# Patient Record
Sex: Female | Born: 1965 | Race: White | Hispanic: No | Marital: Single | Smoking: Former smoker
Health system: Southern US, Community
[De-identification: ages and names within clinical notes are randomized; demographics above are authoritative.]

## PROBLEM LIST (undated history)

## (undated) DIAGNOSIS — G35D Multiple sclerosis, unspecified: Secondary | ICD-10-CM

## (undated) DIAGNOSIS — G709 Myoneural disorder, unspecified: Secondary | ICD-10-CM

## (undated) DIAGNOSIS — H539 Unspecified visual disturbance: Secondary | ICD-10-CM

## (undated) DIAGNOSIS — G35 Multiple sclerosis: Secondary | ICD-10-CM

## (undated) DIAGNOSIS — R011 Cardiac murmur, unspecified: Secondary | ICD-10-CM

## (undated) DIAGNOSIS — M1990 Unspecified osteoarthritis, unspecified site: Secondary | ICD-10-CM

## (undated) DIAGNOSIS — Z87440 Personal history of urinary (tract) infections: Secondary | ICD-10-CM

## (undated) DIAGNOSIS — I959 Hypotension, unspecified: Secondary | ICD-10-CM

## (undated) DIAGNOSIS — I341 Nonrheumatic mitral (valve) prolapse: Secondary | ICD-10-CM

## (undated) DIAGNOSIS — K5900 Constipation, unspecified: Secondary | ICD-10-CM

## (undated) HISTORY — DX: Constipation, unspecified: K59.00

## (undated) HISTORY — PX: UPPER GASTROINTESTINAL ENDOSCOPY: SHX188

## (undated) HISTORY — PX: MENISCUS REPAIR: SHX5179

## (undated) HISTORY — DX: Hypotension, unspecified: I95.9

## (undated) HISTORY — DX: Myoneural disorder, unspecified: G70.9

## (undated) HISTORY — DX: Unspecified osteoarthritis, unspecified site: M19.90

## (undated) HISTORY — DX: Unspecified visual disturbance: H53.9

## (undated) HISTORY — DX: Cardiac murmur, unspecified: R01.1

## (undated) HISTORY — DX: Nonrheumatic mitral (valve) prolapse: I34.1

## (undated) HISTORY — DX: Multiple sclerosis, unspecified: G35.D

## (undated) HISTORY — DX: Personal history of urinary (tract) infections: Z87.440

## (undated) HISTORY — DX: Multiple sclerosis: G35

## (undated) HISTORY — PX: COLONOSCOPY: SHX174

---

## 2010-09-25 DIAGNOSIS — G35 Multiple sclerosis: Secondary | ICD-10-CM | POA: Insufficient documentation

## 2014-01-11 HISTORY — PX: OTHER SURGICAL HISTORY: SHX169

## 2014-12-30 DIAGNOSIS — R269 Unspecified abnormalities of gait and mobility: Secondary | ICD-10-CM | POA: Insufficient documentation

## 2015-06-10 DIAGNOSIS — M7061 Trochanteric bursitis, right hip: Secondary | ICD-10-CM | POA: Insufficient documentation

## 2016-06-04 ENCOUNTER — Encounter (INDEPENDENT_AMBULATORY_CARE_PROVIDER_SITE_OTHER): Payer: Self-pay

## 2016-06-04 ENCOUNTER — Ambulatory Visit (INDEPENDENT_AMBULATORY_CARE_PROVIDER_SITE_OTHER): Payer: BC Managed Care – PPO | Admitting: Neurology

## 2016-06-04 ENCOUNTER — Encounter: Payer: Self-pay | Admitting: Neurology

## 2016-06-04 VITALS — BP 108/70 | HR 68 | Resp 16 | Ht 64.0 in | Wt 118.5 lb

## 2016-06-04 DIAGNOSIS — G4700 Insomnia, unspecified: Secondary | ICD-10-CM

## 2016-06-04 DIAGNOSIS — R269 Unspecified abnormalities of gait and mobility: Secondary | ICD-10-CM

## 2016-06-04 DIAGNOSIS — R350 Frequency of micturition: Secondary | ICD-10-CM | POA: Diagnosis not present

## 2016-06-04 DIAGNOSIS — R208 Other disturbances of skin sensation: Secondary | ICD-10-CM

## 2016-06-04 DIAGNOSIS — G35 Multiple sclerosis: Secondary | ICD-10-CM

## 2016-06-04 MED ORDER — IMIPRAMINE HCL 10 MG PO TABS: ORAL_TABLET | ORAL | 5 refills | Status: DC

## 2016-06-04 NOTE — Progress Notes (Signed)
[facility]  PATIENT: Jessica Salazar DOB: [birth year]  REFERRING DOCTOR OR PCP:  Dr. Durward Parcel SOURCE: patient, notes from PCP, MRI report, MRI images on CD  _________________________________   HISTORICAL  CHIEF COMPLAINT:  Chief Complaint  Patient presents with  . Multiple Sclerosis    Jessica Salazar is here to transfer care of MS.  Sts. she was dx. about 25 yrs. ago while living in [place] (Dr. Brooks Sailors).  Also saw Dr. Cecil Cranker in [place], [place]. Presenting sx. burning, tingling right upper back, shoulder, chest.  She has only ever been on Rebif, which she took for about 2 yrs.--2003-2005.  She stopped due to side effects and didn't feel it helped. Sts. her most bothersome sx. are gait/balance.  Feels like legs are very heavy. Bladder urgency. Fatigue.  Most recently   . Numbness    followed by [facility].  Last MRI 2011, and she has cd with her/fim  . Gait Disturbance    HISTORY OF PRESENT ILLNESS:  I had the pleasure of seeing your patient, Jessica Salazar, at [facility] for neurologic consultation regarding her multiple sclerosis.  She is a 51 yo woman who presented with a painful dysesthetic sensation in the upper back and upper chest about age 89 around 77.   In retrospect, she had other milder symptoms in the preceding year or two.  She also started to have poor balance.     She had an MRI consistent with MS and did not require an LP.    She first saw a general neurologist and was started on Rebif 44 mcg (no titration).   She felt terrible and went to the ED but continued Rebif.  At one point Avonex was tried but she felt even worse than she did on the Rebif.   She then started to see Dr. Buford Dresser.   She stopped Rebif due to an extended power outage [facility]).    She had a few optic neuritis exacerbations in the mid 2000's and received IV Solu-Medrol.   She has not been on any DMT on a regular basis since 2005.    Besides ON, she had a few other exacerbation involving paresthesias and some fluctuations in her balance.    She moved to [facility] in 2011 and saw Dr. Imelda Pillow just once or twice and Dr. Pamalee Leyden once.   She never went back to [facility].      Gait/strength/sensation:   She has poor balance. Gait was worse when her knee pain was worse but she had TKR in 2016 and finally is feeling better.   Besides MS, she also has had a lot of right leg problems due to fractures.  The right leg is weaker than her left.    She has dysesthesias involving the left hand and below her waist.     Bladder:   She has urinary frequency, urgency.  There is no hesitancy but she does not completely empty.  She has nocturia x 4.  She gets UTIs. She has never been on any bladder medication.    She has constipation.  Vision:   She reports left worse than right visual acuity but colors seem more symmetric.    She denies diplopia.  Fatigue/sleep:   She reports mild fatigue.    She is able to accomplish work and chores and has only called in sick once the past few years.  She only does mildly worse in the heat.    She has  some insomnia, sleep maintenance worse than sleep onset.    Nocturia x 4 disturbs her sleep.   Mood/cognition:    She denies depression or anxiety.    She notes no major cognitive issues but has mild word finding difficulty.      I personally reviewed MRIs of the brain and spinal cord from September 2011. The MRI of the cervical spine shows that she has a few T2/FLAIR hyperintense foci located at C2, C5, C6 and C7. None of these appear to be acute. The MRI of the thoracic spine showed some probable lesions on the sagittal images. The axial images were not interpretable. The MRI of the brain showed classic T2/FLAIR hyperintense foci in the periventricular and juxtacortical white matter.  REVIEW OF SYSTEMS: Constitutional: No fevers, chills, sweats, or change in appetite.   Mild fatigue. has  Insomnia Eyes: No  visual changes, double vision, eye pain Ear, nose and throat: No hearing loss, ear pain, nasal congestion, sore throat Cardiovascular: No chest pain, palpitations Respiratory: No shortness of breath at rest or with exertion.   No wheezes GastrointestinaI: No nausea, vomiting, diarrhea, abdominal pain, fecal incontinence Genitourinary: as above Musculoskeletal: No neck pain, back pain Integumentary: No rash, pruritus, skin lesions Neurological: as above Psychiatric: No depression at this time.  No anxiety Endocrine: No palpitations, diaphoresis, change in appetite, change in weigh or increased thirst Hematologic/Lymphatic: No anemia, purpura, petechiae. Allergic/Immunologic: No itchy/runny eyes, nasal congestion, recent allergic reactions, rashes  ALLERGIES: No Known Allergies  HOME MEDICATIONS: No current outpatient prescriptions on file.  PAST MEDICAL HISTORY: Past Medical History:  Diagnosis Date  . Hypotension   . Mitral valve prolapse   . Multiple sclerosis ([place])   . Vision abnormalities     PAST SURGICAL HISTORY: Past Surgical History:  Procedure Laterality Date  . right knee replacement      FAMILY HISTORY: Family History  Problem Relation Age of Onset  . Heart attack Father     SOCIAL HISTORY:  Social History   Social History  . Marital status: Single    Spouse name: N/A  . Number of children: N/A  . Years of education: N/A   Occupational History  . Not on file.   Social History Main Topics  . Smoking status: Former Research scientist (life sciences)  . Smokeless tobacco: Never Used  . Alcohol use Yes     Comment: Rare  . Drug use: No  . Sexual activity: Not on file   Other Topics Concern  . Not on file   Social History Narrative  . No narrative on file     PHYSICAL EXAM  Vitals:   06/04/16 0832  BP: 108/70  Pulse: 68  Resp: 16  Weight: 118 lb 8 oz (53.8 kg)  Height: 5\' 4"  (1.626 m)    Body mass index is 20.34 kg/m.   General: The patient is  well-developed and well-nourished and in no acute distress  Eyes:  Funduscopic exam shows mild left optic nerve pallor.   Normal retinal vessels.  Neck: The neck is supple, no carotid bruits are noted.  The neck is nontender.  Cardiovascular: The heart has a regular rate and rhythm with a normal S1 and S2. There were no murmurs, gallops or rubs. Lungs are clear to auscultation.  Skin: Extremities are without significant edema.  Musculoskeletal:  Back is mildly tender.  Post op right TKR  Neurologic Exam  Mental status: The patient is alert and oriented x 3 at the time of the examination.  The patient has apparent normal recent and remote memory, with an apparently normal attention span and concentration ability.   Speech is normal.  Cranial nerves: Extraocular movements are full. Pupils are equal, round, and reactive to light and accomodation.  Visual fields are full.  Facial symmetry is present. There is good facial sensation to soft touch bilaterally.Facial strength is normal.  Trapezius and sternocleidomastoid strength is normal. No dysarthria is noted.  The tongue is midline, and the patient has symmetric elevation of the soft palate. No obvious hearing deficits are noted.  Motor:  Muscle bulk is normal.   Tone is normal. Strength is  5 / 5 in all 4 extremities.   Sensory: She has reduced sensation to vibration in both legs, worse on the right. She has reduced sensation to temperature and touch in the right leg and reduced temperature sensation in the right arm. There is some allodynia in the left arm.  Coordination: Cerebellar testing reveals good finger-nose-finger and reduced right heel-to-shin bilaterally.  Gait and station: Station is normal.   Gait is wide and she has a slight right foot drop. Tandem gait is very widel. Romberg is negative.   Reflexes: Deep tendon reflexes are symmetric and normal bilaterally.   Plantar responses are flexor.    DIAGNOSTIC DATA (LABS, IMAGING,  TESTING) - I reviewed patient records, labs, notes, testing and imaging myself where available.      ASSESSMENT AND PLAN  Multiple sclerosis ([place]) - Plan: MR BRAIN W WO CONTRAST, MR CERVICAL SPINE WO CONTRAST, CBC with Differential/Platelet, Hepatic function panel, Quantiferon tb gold assay (blood)  Gait abnormality - Plan: MR BRAIN W WO CONTRAST, MR CERVICAL SPINE WO CONTRAST  Dysesthesia  Urinary frequency  Insomnia, unspecified type   In summary, Shakeerah Gradel is a 51 year old woman with multiple sclerosis. She had multiple exacerbations early on in her course but feels that she has been more stable over the past decade. She was only on disease modifying therapies for a couple of years. Her last MRI was performed in 2011. I will check an MRI of the brain and cervical spine to better determine the aggressiveness of her MS. If she has gone the past 7 years without any significant change (less than a couple new lesions in the brain, no new lesions in the spinal cord), then she may be able to continue off of a disease modifying therapy. However, if she has more changes then I would highly recommend that she reinitiate therapy. We discussed that there are many more options now that were available when she was last treated. We went over some of these options and she would be most interested in [place]. I will go ahead and check some blood work in case we decide to proceed. To help with her insomnia and nocturia, I will have her start imipramine 10 mg and titrate up to 20 mg as tolerated.  She will return to see me in 3-4 months or sooner if there are new or worsening neurologic symptoms.  Thank you for asking me to see Mrs. Delaurentis. Please let me know if I can be of further assistance with her or other patients in the future.    Talani Brazee A. Felecia Shelling, MD, PhD 6/72/0947, 0:96 AM Certified in Neurology, Clinical Neurophysiology, Sleep Medicine, Pain Medicine and Neuroimaging  [facility] [address], [place], [place] [address] [phone]

## 2016-06-05 LAB — CBC WITH DIFFERENTIAL/PLATELET
BASOS: 1 %
Basophils Absolute: 0 10*3/uL (ref 0.0–0.2)
EOS (ABSOLUTE): 0.2 10*3/uL (ref 0.0–0.4)
EOS: 3 %
HEMATOCRIT: 37.1 % (ref 34.0–46.6)
HEMOGLOBIN: 11.6 g/dL (ref 11.1–15.9)
Immature Grans (Abs): 0 10*3/uL (ref 0.0–0.1)
Immature Granulocytes: 0 %
LYMPHS ABS: 1.5 10*3/uL (ref 0.7–3.1)
Lymphs: 26 %
MCH: 29.2 pg (ref 26.6–33.0)
MCHC: 31.3 g/dL — AB (ref 31.5–35.7)
MCV: 94 fL (ref 79–97)
MONOCYTES: 7 %
Monocytes Absolute: 0.4 10*3/uL (ref 0.1–0.9)
NEUTROS ABS: 3.6 10*3/uL (ref 1.4–7.0)
Neutrophils: 63 %
Platelets: 219 10*3/uL (ref 150–379)
RBC: 3.97 x10E6/uL (ref 3.77–5.28)
RDW: 14.1 % (ref 12.3–15.4)
WBC: 5.8 10*3/uL (ref 3.4–10.8)

## 2016-06-05 LAB — HEPATIC FUNCTION PANEL
ALK PHOS: 78 IU/L (ref 39–117)
ALT: 9 IU/L (ref 0–32)
AST: 14 IU/L (ref 0–40)
Albumin: 4.3 g/dL (ref 3.5–5.5)
BILIRUBIN, DIRECT: 0.19 mg/dL (ref 0.00–0.40)
Bilirubin Total: 1.1 mg/dL (ref 0.0–1.2)
Total Protein: 6.7 g/dL (ref 6.0–8.5)

## 2016-06-09 LAB — QUANTIFERON IN TUBE
QFT TB AG MINUS NIL VALUE: 0.08 IU/mL
QUANTIFERON MITOGEN VALUE: 8.3 IU/mL
QUANTIFERON TB AG VALUE: 0.21 [IU]/mL
QUANTIFERON TB GOLD: NEGATIVE
Quantiferon Nil Value: 0.13 IU/mL

## 2016-06-09 LAB — QUANTIFERON TB GOLD ASSAY (BLOOD)

## 2016-06-10 ENCOUNTER — Encounter: Payer: Self-pay | Admitting: *Deleted

## 2016-06-14 ENCOUNTER — Telehealth: Payer: Self-pay | Admitting: *Deleted

## 2016-06-14 NOTE — Telephone Encounter (Signed)
Aubagio PA completed by phone with [facility] phone# [phone].  Pt. has tried and failed Rebif./fim

## 2016-06-15 NOTE — Telephone Encounter (Signed)
Fax received from [place]. Aubagio PA approved for dates 06/14/16 thru 06/15/18.  PA# [place] [phone] MW/fim

## 2016-06-19 ENCOUNTER — Ambulatory Visit
Admission: RE | Admit: 2016-06-19 | Discharge: 2016-06-19 | Disposition: A | Discharge status: Home / Self Care | Payer: BC Managed Care – PPO | Source: Ambulatory Visit | Attending: Neurology | Admitting: Neurology

## 2016-06-19 ENCOUNTER — Ambulatory Visit
Admission: RE | Admit: 2016-06-19 | Discharge: 2016-06-19 | Disposition: A | Discharge status: Home / Self Care | Payer: Self-pay | Source: Ambulatory Visit | Attending: Neurology | Admitting: Neurology

## 2016-06-19 DIAGNOSIS — R269 Unspecified abnormalities of gait and mobility: Secondary | ICD-10-CM

## 2016-06-19 DIAGNOSIS — G35 Multiple sclerosis: Secondary | ICD-10-CM

## 2016-06-19 MED ORDER — GADOBENATE DIMEGLUMINE 529 MG/ML IV SOLN
10.0000 mL | Freq: Once | INTRAVENOUS | Status: AC | PRN
  Administered 2016-06-19: 10 mL via INTRAVENOUS

## 2016-06-21 ENCOUNTER — Telehealth: Payer: Self-pay | Admitting: *Deleted

## 2016-06-21 ENCOUNTER — Encounter: Payer: Self-pay | Admitting: Neurology

## 2016-06-21 NOTE — Telephone Encounter (Signed)
I have spoken with pt. and per RAS, advised that MRI's look about he same as they did 6 yrs. ago.  She may start Aubagio, as she does have old changes in her spinal cord, or she may wait to see if she develops new sx./what future MRI's show.  Pt. sts. that as ins. has approved Aubagio. she thinks she will go ahead and start it and let me know if she can't tolerate it.  She is aware lft's will need to be checked once a month for 5 mos.  She would like these checked in [facility].  She will let me know which [facility] location to fax orders to/fim

## 2016-08-10 ENCOUNTER — Other Ambulatory Visit: Payer: Self-pay | Admitting: *Deleted

## 2016-08-10 ENCOUNTER — Encounter: Payer: Self-pay | Admitting: Neurology

## 2016-08-10 DIAGNOSIS — Z79899 Other long term (current) drug therapy: Secondary | ICD-10-CM

## 2016-08-10 DIAGNOSIS — G35 Multiple sclerosis: Secondary | ICD-10-CM

## 2016-08-10 NOTE — Progress Notes (Signed)
Standing orders for monthly lft's faxed to [facility] fax# [phone], per pt's request/fim

## 2016-08-17 ENCOUNTER — Other Ambulatory Visit: Payer: Self-pay | Admitting: Neurology

## 2016-08-18 ENCOUNTER — Encounter: Payer: Self-pay | Admitting: Neurology

## 2016-08-18 LAB — HEPATIC FUNCTION PANEL
ALBUMIN: 4.5 g/dL (ref 3.5–5.5)
ALT: 14 IU/L (ref 0–32)
AST: 23 IU/L (ref 0–40)
Alkaline Phosphatase: 85 IU/L (ref 39–117)
BILIRUBIN TOTAL: 1.2 mg/dL (ref 0.0–1.2)
Bilirubin, Direct: 0.23 mg/dL (ref 0.00–0.40)
Total Protein: 7.2 g/dL (ref 6.0–8.5)

## 2016-08-24 ENCOUNTER — Encounter: Payer: Self-pay | Admitting: Neurology

## 2016-09-06 ENCOUNTER — Encounter: Payer: Self-pay | Admitting: Neurology

## 2016-09-07 ENCOUNTER — Encounter: Payer: Self-pay | Admitting: Neurology

## 2016-09-14 ENCOUNTER — Other Ambulatory Visit: Payer: Self-pay | Admitting: Neurology

## 2016-09-16 LAB — HEPATIC FUNCTION PANEL
ALK PHOS: 84 IU/L
ALT: 14 IU/L
AST: 20 IU/L (ref 0–40)
Albumin: 4.5 g/dL
BILIRUBIN, DIRECT: 0.18 mg/dL (ref 0.00–0.40)
Bilirubin Total: 1 mg/dL (ref 0.0–1.2)
Total Protein: 6.5 g/dL (ref 6.0–8.5)

## 2016-09-20 ENCOUNTER — Telehealth: Payer: Self-pay | Admitting: *Deleted

## 2016-09-20 NOTE — Telephone Encounter (Signed)
I have spoken with Jessica Salazar today and advised that per RAS, lab work done in our office is fine.  She verbalized understanding of same./fim

## 2016-09-20 NOTE — Telephone Encounter (Signed)
-----   Message from Britt Bottom, MD sent at 09/20/2016  1:10 PM EDT ----- Please let the patient know that the lab work is fine.

## 2016-10-01 ENCOUNTER — Ambulatory Visit: Payer: BC Managed Care – PPO | Admitting: Neurology

## 2016-10-11 ENCOUNTER — Encounter: Payer: Self-pay | Admitting: Neurology

## 2016-10-12 ENCOUNTER — Other Ambulatory Visit: Payer: Self-pay | Admitting: Neurology

## 2016-10-13 ENCOUNTER — Telehealth: Payer: Self-pay | Admitting: *Deleted

## 2016-10-13 ENCOUNTER — Encounter: Payer: Self-pay | Admitting: *Deleted

## 2016-10-13 LAB — HEPATIC FUNCTION PANEL
ALK PHOS: 79 IU/L (ref 39–117)
ALT: 15 IU/L (ref 0–32)
AST: 21 IU/L (ref 0–40)
Albumin: 4.6 g/dL (ref 3.5–5.5)
Bilirubin Total: 1.1 mg/dL (ref 0.0–1.2)
Bilirubin, Direct: 0.21 mg/dL (ref 0.00–0.40)
Total Protein: 6.6 g/dL (ref 6.0–8.5)

## 2016-10-13 NOTE — Telephone Encounter (Signed)
Lab results sent to pt. via My Chart/fim

## 2016-10-13 NOTE — Telephone Encounter (Signed)
-----   Message from Britt Bottom, MD sent at 10/13/2016  1:33 PM EDT ----- Please let the patient know that the lab work is fine.

## 2017-01-11 HISTORY — PX: COLONOSCOPY: SHX174

## 2017-07-27 ENCOUNTER — Ambulatory Visit: Payer: BC Managed Care – PPO | Admitting: Family

## 2017-07-27 ENCOUNTER — Other Ambulatory Visit (INDEPENDENT_AMBULATORY_CARE_PROVIDER_SITE_OTHER): Payer: BC Managed Care – PPO

## 2017-07-27 ENCOUNTER — Encounter: Payer: Self-pay | Admitting: Family

## 2017-07-27 VITALS — BP 98/60 | HR 55 | Temp 98.2°F | Ht 64.0 in | Wt 120.0 lb

## 2017-07-27 DIAGNOSIS — Z1231 Encounter for screening mammogram for malignant neoplasm of breast: Secondary | ICD-10-CM

## 2017-07-27 DIAGNOSIS — Z1211 Encounter for screening for malignant neoplasm of colon: Secondary | ICD-10-CM | POA: Diagnosis not present

## 2017-07-27 DIAGNOSIS — Z0000 Encounter for general adult medical examination without abnormal findings: Secondary | ICD-10-CM

## 2017-07-27 DIAGNOSIS — Z1321 Encounter for screening for nutritional disorder: Secondary | ICD-10-CM

## 2017-07-27 DIAGNOSIS — Z13220 Encounter for screening for lipoid disorders: Secondary | ICD-10-CM | POA: Diagnosis not present

## 2017-07-27 LAB — CBC WITH DIFFERENTIAL/PLATELET
BASOS PCT: 0.8 % (ref 0.0–3.0)
Basophils Absolute: 0 10*3/uL (ref 0.0–0.1)
EOS ABS: 0.2 10*3/uL (ref 0.0–0.7)
EOS PCT: 2.9 % (ref 0.0–5.0)
HCT: 38.1 % (ref 36.0–46.0)
HEMOGLOBIN: 12.7 g/dL (ref 12.0–15.0)
LYMPHS ABS: 1.4 10*3/uL (ref 0.7–4.0)
Lymphocytes Relative: 23.1 % (ref 12.0–46.0)
MCHC: 33.5 g/dL (ref 30.0–36.0)
MCV: 90.4 fl (ref 78.0–100.0)
MONO ABS: 0.5 10*3/uL (ref 0.1–1.0)
Monocytes Relative: 7.7 % (ref 3.0–12.0)
NEUTROS PCT: 65.5 % (ref 43.0–77.0)
Neutro Abs: 4.1 10*3/uL (ref 1.4–7.7)
PLATELETS: 237 10*3/uL (ref 150.0–400.0)
RBC: 4.21 Mil/uL (ref 3.87–5.11)
RDW: 13.7 % (ref 11.5–15.5)
WBC: 6.3 10*3/uL (ref 4.0–10.5)

## 2017-07-27 LAB — LIPID PANEL
CHOLESTEROL: 210 mg/dL — AB (ref 0–200)
HDL: 75.1 mg/dL (ref >39.00)
LDL Cholesterol: 118 mg/dL — ABNORMAL HIGH (ref 0–99)
NonHDL: 134.52
Total CHOL/HDL Ratio: 3
Triglycerides: 83 mg/dL (ref 0.0–149.0)
VLDL: 16.6 mg/dL (ref 0.0–40.0)

## 2017-07-27 LAB — VITAMIN D 25 HYDROXY (VIT D DEFICIENCY, FRACTURES): VITD: 33.73 ng/mL (ref 30.00–100.00)

## 2017-07-27 LAB — COMPREHENSIVE METABOLIC PANEL
ALBUMIN: 4.5 g/dL (ref 3.5–5.2)
ALT: 12 U/L (ref 0–35)
AST: 16 U/L (ref 0–37)
Alkaline Phosphatase: 60 U/L (ref 39–117)
BUN: 18 mg/dL (ref 6–23)
CHLORIDE: 105 meq/L (ref 96–112)
CO2: 30 mEq/L (ref 19–32)
CREATININE: 0.6 mg/dL (ref 0.40–1.20)
Calcium: 9.6 mg/dL (ref 8.4–10.5)
GFR: 110.66 mL/min (ref >60.00)
Glucose, Bld: 92 mg/dL (ref 70–99)
POTASSIUM: 4 meq/L (ref 3.5–5.1)
SODIUM: 142 meq/L (ref 135–145)
TOTAL PROTEIN: 7.4 g/dL (ref 6.0–8.3)
Total Bilirubin: 1.1 mg/dL (ref 0.2–1.2)

## 2017-07-27 LAB — TSH: TSH: 0.92 u[IU]/mL (ref 0.35–4.50)

## 2017-07-27 MED ORDER — CIPROFLOXACIN HCL 500 MG PO TABS
500.0000 mg | ORAL_TABLET | Freq: Two times a day (BID) | ORAL | 0 refills | Status: DC
Start: 2017-07-27 — End: 2017-09-29

## 2017-07-27 NOTE — Progress Notes (Signed)
Jessica Salazar is a 52 y.o. female with the following history as recorded in EpicCare:  Patient Active Problem List   Diagnosis Date Noted  . Dysesthesia 06/04/2016  . Urinary frequency 06/04/2016  . Insomnia 06/04/2016  . Greater trochanteric bursitis of right hip 06/10/2015  . Gait abnormality 12/30/2014  . Multiple sclerosis ([place]) 09/25/2010    Current Outpatient Medications  Medication Sig Dispense Refill  . ciprofloxacin (CIPRO) 500 MG tablet Take 1 tablet (500 mg total) by mouth 2 (two) times daily. 10 tablet 0   No current facility-administered medications for this visit.     Allergies: Patient has no known allergies.  Past Medical History:  Diagnosis Date  . Hypotension   . Mitral valve prolapse   . Multiple sclerosis ([place])   . Vision abnormalities     Past Surgical History:  Procedure Laterality Date  . right knee replacement      Family History  Problem Relation Age of Onset  . Heart attack Father     Social History   Tobacco Use  . Smoking status: Former Research scientist (life sciences)  . Smokeless tobacco: Never Used  Substance Use Topics  . Alcohol use: Yes    Comment: Rare    Subjective:  Presents to establish as a new patient today; has recently moved here from [place]; history of MS ( will be seeing specialist at [facility] to establish care in the next few weeks); will be keeping orthopedic care at [facility];  Works at [employer] as Engineer, water for [employer];  Does have occasionally UTIs related to [place]; would like to get updated Rx for Cipro to use if infection develops;   Review of Systems  Constitutional: Negative for chills, fever and weight loss.  HENT: Negative for congestion and hearing loss.   Eyes: Negative for blurred vision.  Respiratory: Negative for cough and shortness of breath.   Cardiovascular: Negative for chest pain and palpitations.  Gastrointestinal: Positive for constipation.       Chronic  Genitourinary: Negative.   Musculoskeletal:  Positive for joint pain and myalgias.  Skin: Negative.   Neurological: Negative for dizziness and headaches.  Endo/Heme/Allergies: Negative.   Psychiatric/Behavioral: Negative.      Objective:  Vitals:   07/27/17 1038  BP: 98/60  Pulse: (!) 55  Temp: 98.2 F (36.8 C)  TempSrc: Oral  SpO2: 98%  Weight: 120 lb (54.4 kg)  Height: '5\' 4"'$  (1.626 m)    General: Well developed, well nourished, in no acute distress  Skin : Warm and dry.  Head: Normocephalic and atraumatic  Eyes: Sclera and conjunctiva clear; pupils round and reactive to light; extraocular movements intact  Ears: External normal; canals clear; tympanic membranes normal  Oropharynx: Pink, supple. No suspicious lesions  Neck: Supple without thyromegaly, adenopathy  Lungs: Respirations unlabored; clear to auscultation bilaterally without wheeze, rales, rhonchi  CVS exam: normal rate and regular rhythm.  Abdomen: Soft; nontender; nondistended; normoactive bowel sounds; no masses or hepatosplenomegaly  Musculoskeletal: No deformities; no active joint inflammation; brace for right knee/ right quad atrophy  Extremities: No edema, cyanosis, clubbing  Vessels: Symmetric bilaterally  Neurologic: Alert and oriented; speech intact; face symmetrical; moves all extremities well; CNII-XII intact without focal deficit; uses cane for stability;  Assessment:  1. PE (physical exam), annual   2. Screening mammogram, encounter for   3. Encounter for screening colonoscopy   4. Need for lipid screening   5. Encounter for vitamin deficiency screening     Plan:  Age appropriate  preventive healthcare needs addressed; encouraged regular eye doctor and dental exams; encouraged regular exercise; will update labs and refills as needed today; follow-up to be determined; Information given regarding Prevnar and Pneumovax; she will check on coverage for Shingrix; Call back if DEXA needs to be updated; Refill given on Cipro as requested;   No  follow-ups on file.  Orders Placed This Encounter  Procedures  . MM Digital Screening    Standing Status:   Future    Standing Expiration Date:   09/28/2018    Order Specific Question:   Reason for Exam (SYMPTOM  OR DIAGNOSIS REQUIRED)    Answer:   screening mammogram    Order Specific Question:   Is the patient pregnant?    Answer:   No    Order Specific Question:   Preferred imaging location?    Answer:   [facility]  . CBC w/Diff    Standing Status:   Future    Number of Occurrences:   1    Standing Expiration Date:   07/27/2018  . Comp Met (CMET)    Standing Status:   Future    Number of Occurrences:   1    Standing Expiration Date:   07/27/2018  . Lipid panel    Standing Status:   Future    Number of Occurrences:   1    Standing Expiration Date:   07/28/2018  . TSH    Standing Status:   Future    Number of Occurrences:   1    Standing Expiration Date:   07/27/2018  . Vitamin D (25 hydroxy)    Standing Status:   Future    Number of Occurrences:   1    Standing Expiration Date:   07/27/2018  . Ambulatory referral to Gastroenterology    Referral Priority:   Routine    Referral Type:   Consultation    Referral Reason:   Specialty Services Required    Number of Visits Requested:   1    Requested Prescriptions   Signed Prescriptions Disp Refills  . ciprofloxacin (CIPRO) 500 MG tablet 10 tablet 0    Sig: Take 1 tablet (500 mg total) by mouth 2 (two) times daily.

## 2017-07-27 NOTE — Patient Instructions (Addendum)
Please call BCBS to see if they will cover Shingrix for you before the age of 61;   Please check with your orthopedist about status of Bone Density screening- let me know if we need to update that test for you.

## 2017-08-29 ENCOUNTER — Encounter: Payer: Self-pay | Admitting: Gastroenterology

## 2017-09-29 ENCOUNTER — Encounter: Payer: Self-pay | Admitting: Family

## 2017-09-29 ENCOUNTER — Encounter: Payer: Self-pay | Admitting: Gastroenterology

## 2017-09-29 ENCOUNTER — Ambulatory Visit (AMBULATORY_SURGERY_CENTER): Payer: Self-pay | Admitting: *Deleted

## 2017-09-29 ENCOUNTER — Ambulatory Visit: Payer: BC Managed Care – PPO | Admitting: Family

## 2017-09-29 VITALS — Ht 64.0 in | Wt 119.6 lb

## 2017-09-29 VITALS — BP 102/70 | HR 60 | Temp 98.2°F | Ht 64.0 in | Wt 119.1 lb

## 2017-09-29 DIAGNOSIS — M62838 Other muscle spasm: Secondary | ICD-10-CM

## 2017-09-29 DIAGNOSIS — M545 Low back pain, unspecified: Secondary | ICD-10-CM

## 2017-09-29 DIAGNOSIS — Z1211 Encounter for screening for malignant neoplasm of colon: Secondary | ICD-10-CM

## 2017-09-29 MED ORDER — METHOCARBAMOL 500 MG PO TABS: 500.0000 mg | ORAL_TABLET | Freq: Three times a day (TID) | ORAL | 0 refills | Status: DC | PRN

## 2017-09-29 MED ORDER — MELOXICAM 7.5 MG PO TABS: 7.5000 mg | ORAL_TABLET | Freq: Every day | ORAL | 0 refills | Status: DC

## 2017-09-29 MED ORDER — PEG 3350-KCL-NA BICARB-NACL 420 G PO SOLR: 4000.0000 mL | Freq: Once | ORAL | 0 refills | Status: AC

## 2017-09-29 NOTE — Progress Notes (Signed)
Jessica Salazar is a 52 y.o. female with the following history as recorded in EpicCare:  Patient Active Problem List   Diagnosis Date Noted  . Dysesthesia 06/04/2016  . Urinary frequency 06/04/2016  . Insomnia 06/04/2016  . Greater trochanteric bursitis of right hip 06/10/2015  . Gait abnormality 12/30/2014  . Multiple sclerosis ([place]) 09/25/2010    Current Outpatient Medications  Medication Sig Dispense Refill  . Multiple Vitamins-Minerals (MULTIVITAMIN GUMMIES ADULT PO) Take by mouth. [place] gummy multivitamin 2 po daily    . meloxicam (MOBIC) 7.5 MG tablet Take 1 tablet (7.5 mg total) by mouth daily. 20 tablet 0  . methocarbamol (ROBAXIN) 500 MG tablet Take 1 tablet (500 mg total) by mouth every 8 (eight) hours as needed. 20 tablet 0  . polyethylene glycol-electrolytes (NULYTELY/GOLYTELY) 420 g solution Take 4,000 mLs by mouth once for 1 dose. 4000 mL 0   No current facility-administered medications for this visit.     Allergies: Patient has no known allergies.  Past Medical History:  Diagnosis Date  . Arthritis   . Constipation    due to MS  . Heart murmur    MVP  . History of recurrent UTIs   . Hypotension   . Mitral valve prolapse   . Multiple sclerosis ([place])   . Neuromuscular disorder ([place])    has /NMS stimulator   . Vision abnormalities     Past Surgical History:  Procedure Laterality Date  . COLONOSCOPY     last > 10 yrs ago in [place] - done for constipation  . MENISCUS REPAIR Right   . right knee replacement Right 2016   right TOtal knee relacement   . UPPER GASTROINTESTINAL ENDOSCOPY      Family History  Problem Relation Age of Onset  . Heart attack Father   . Colon polyps Neg Hx   . Colon cancer Neg Hx   . Esophageal cancer Neg Hx   . Rectal cancer Neg Hx   . Stomach cancer Neg Hx     Social History   Tobacco Use  . Smoking status: Former Research scientist (life sciences)  . Smokeless tobacco: Never Used  Substance Use Topics  . Alcohol use: Yes    Comment: Rare     Subjective:  Patient presents with low back pain x 2 days; feels like she strained her low back getting off her mattress on Tuesday; no numbness or tingling radiating into lower legs; using TENS simulator with some benefit; has taken 600 mg Ibuprofen with mild relief;     Objective:  Vitals:   09/29/17 1412  BP: 102/70  Pulse: 60  Temp: 98.2 F (36.8 C)  TempSrc: Oral  SpO2: 99%  Weight: 119 lb 1.3 oz (54 kg)  Height: 5\' 4"  (1.626 m)    General: Well developed, well nourished, in no acute distress  Skin : Warm and dry.  Head: Normocephalic and atraumatic  Lungs: Respirations unlabored; clear to auscultation bilaterally without wheeze, rales, rhonchi  Musculoskeletal: No deformities; no active joint inflammation  Extremities: No edema, cyanosis, clubbing  Vessels: Symmetric bilaterally  Neurologic: Alert and oriented; speech intact; face symmetrical; moves all extremities well; CNII-XII intact without focal deficit   Assessment:  1. Low back pain without sciatica, unspecified back pain laterality, unspecified chronicity   2. Muscle spasm     Plan:  Rx for Mobic 7.5 mg qd, Robaxin 500 mg qhs; apply heat and follow-up worse, no better.   No follow-ups on file.  No orders of the  defined types were placed in this encounter.   Requested Prescriptions   Signed Prescriptions Disp Refills  . meloxicam (MOBIC) 7.5 MG tablet 20 tablet 0    Sig: Take 1 tablet (7.5 mg total) by mouth daily.  . methocarbamol (ROBAXIN) 500 MG tablet 20 tablet 0    Sig: Take 1 tablet (500 mg total) by mouth every 8 (eight) hours as needed.

## 2017-09-29 NOTE — Progress Notes (Signed)
No egg or soy allergy known to patient  No issues with past sedation with any surgeries  or procedures, no intubation problems  No diet pills per patient No home 02 use per patient  No blood thinners per patient  Pt STATES  issues with constipation  Due to MS_ does not take meds to help this  No A fib or A flutter  EMMI video sent to pt's e mail = pt declined

## 2017-10-10 ENCOUNTER — Ambulatory Visit
Admission: RE | Admit: 2017-10-10 | Discharge: 2017-10-10 | Disposition: A | Discharge status: Home / Self Care | Payer: BC Managed Care – PPO | Source: Ambulatory Visit | Attending: Family | Admitting: Family

## 2017-10-10 DIAGNOSIS — Z1231 Encounter for screening mammogram for malignant neoplasm of breast: Secondary | ICD-10-CM

## 2017-10-10 IMAGING — MG DIGITAL SCREENING BILATERAL MAMMOGRAM WITH TOMO AND CAD
8 series · 9 of 24 positions shown · non-contrast
Comparison: None.

CLINICAL DATA: Screening.

EXAM:
DIGITAL SCREENING BILATERAL MAMMOGRAM WITH TOMO AND CAD

[L CC synth-2D]
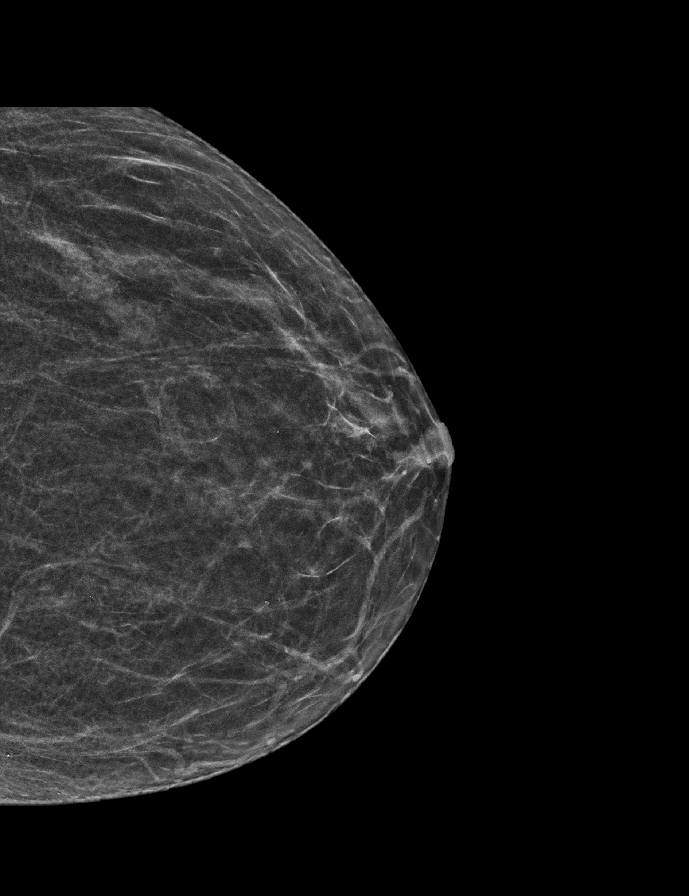

[R CC synth-2D]
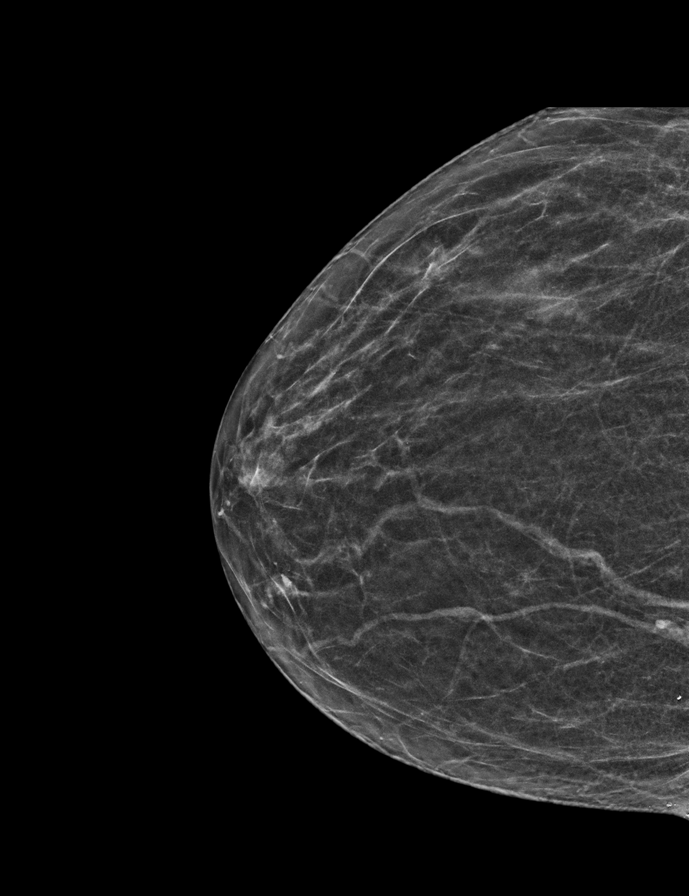

[R MLO synth-2D]
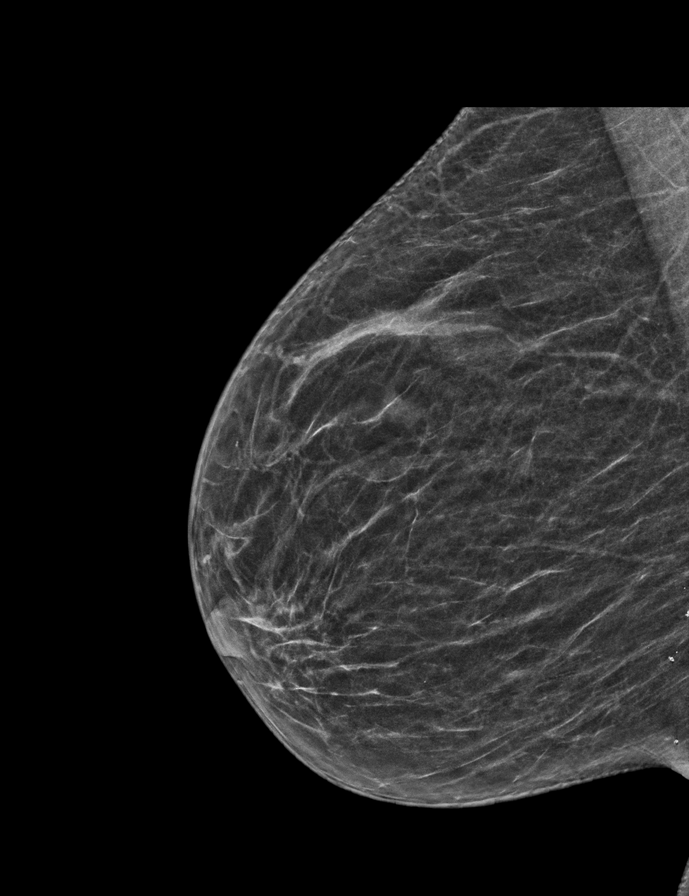

[L MLO synth-2D]
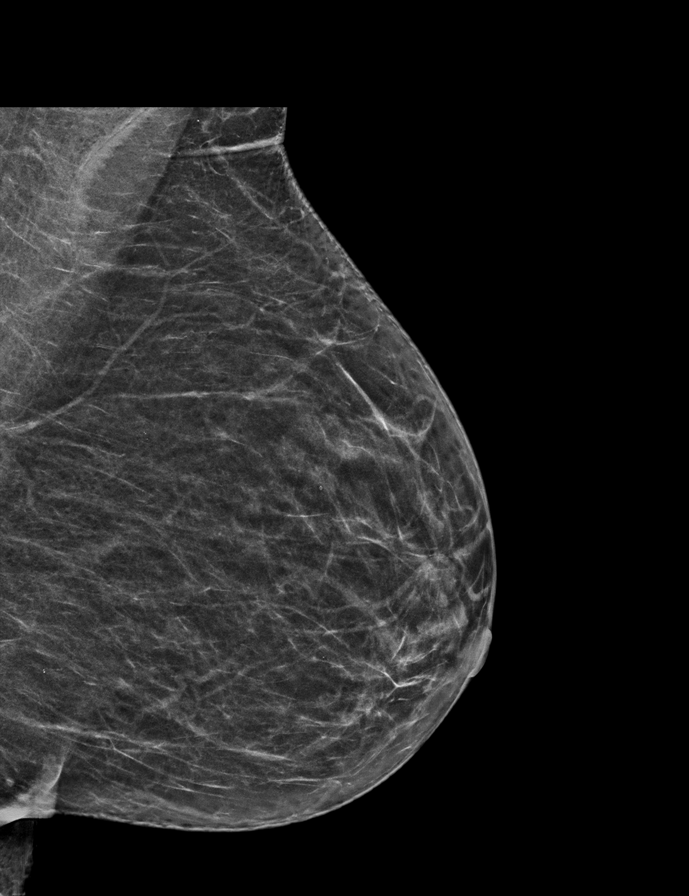

[R MLO tomo · 2 of 52 frames shown]
[frame 17/52]
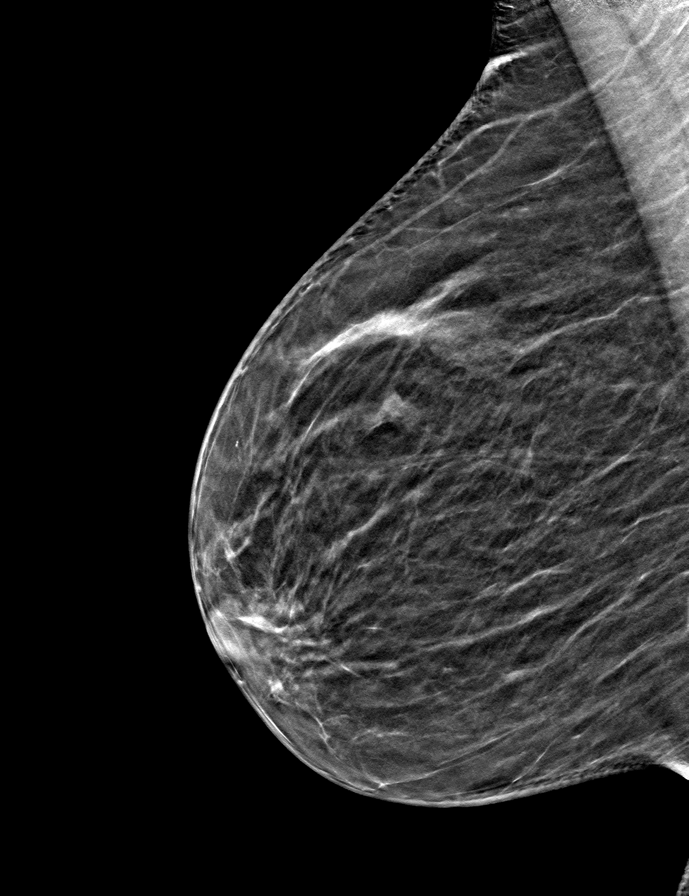
[frame 27/52]
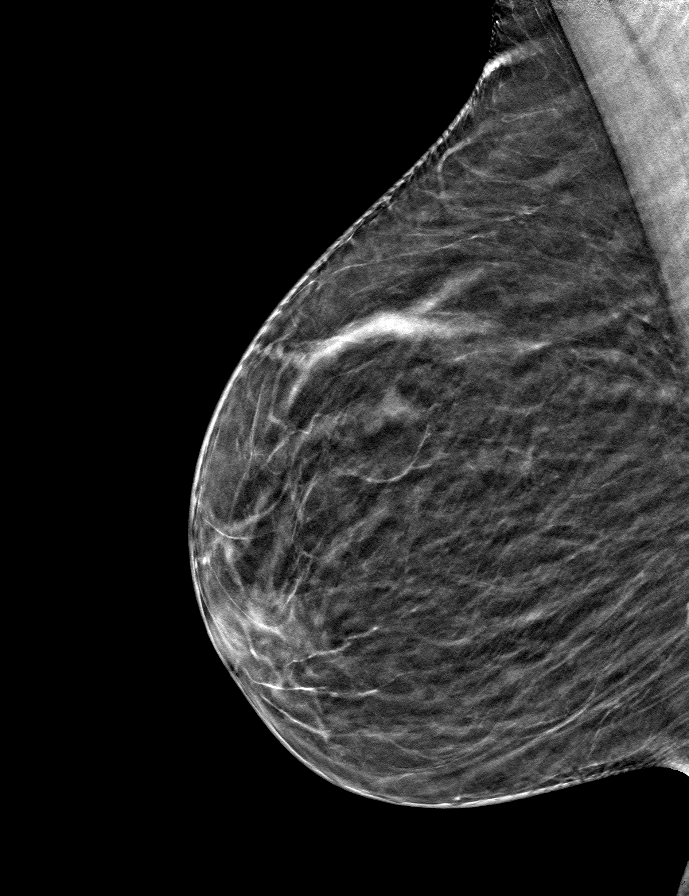

[L CC tomo · tomo slice 23/46.0]
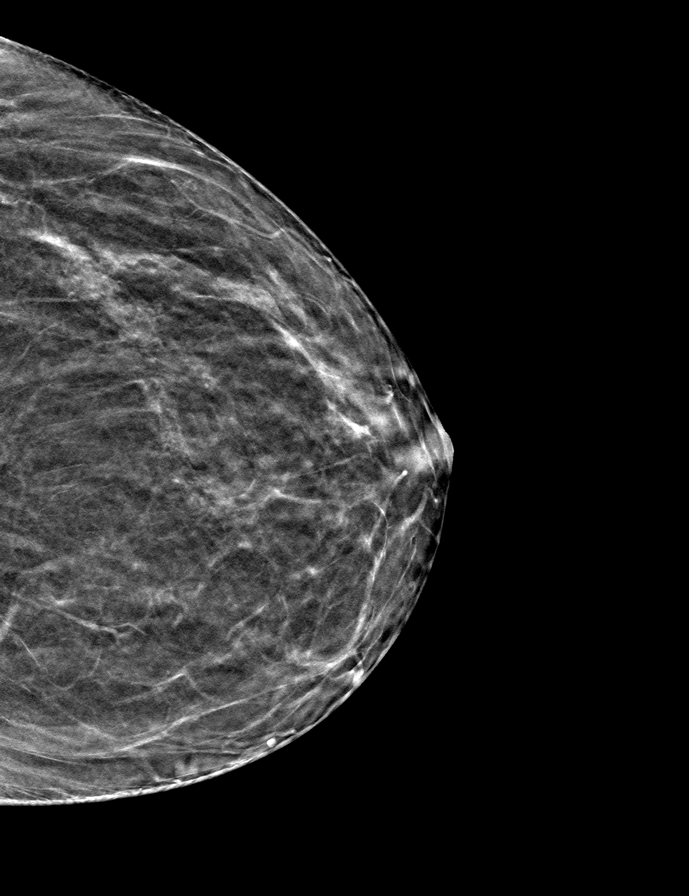

[L MLO tomo · tomo slice 28/55.0]
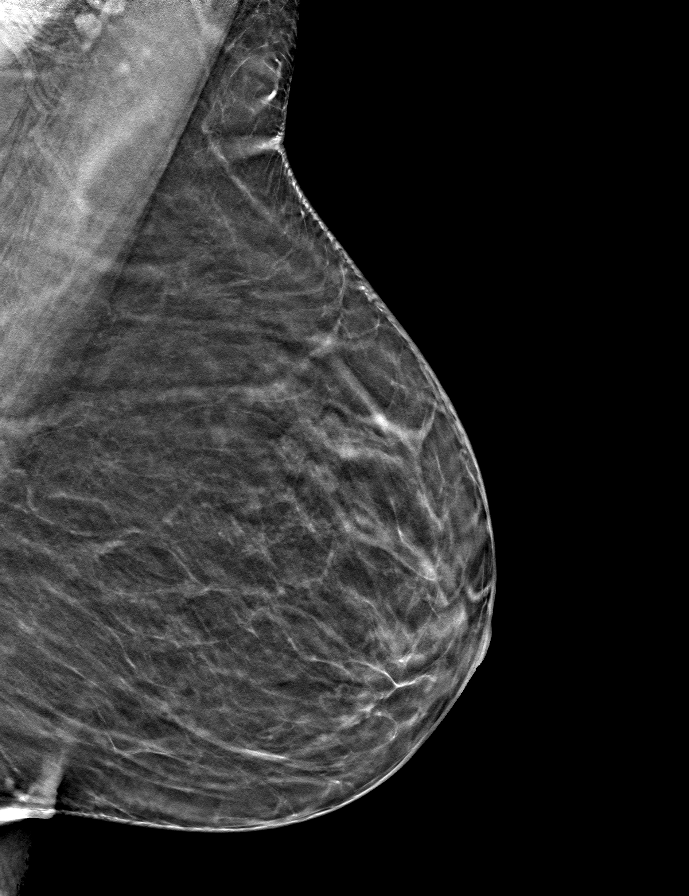

[R CC tomo · tomo slice 25/49.0]
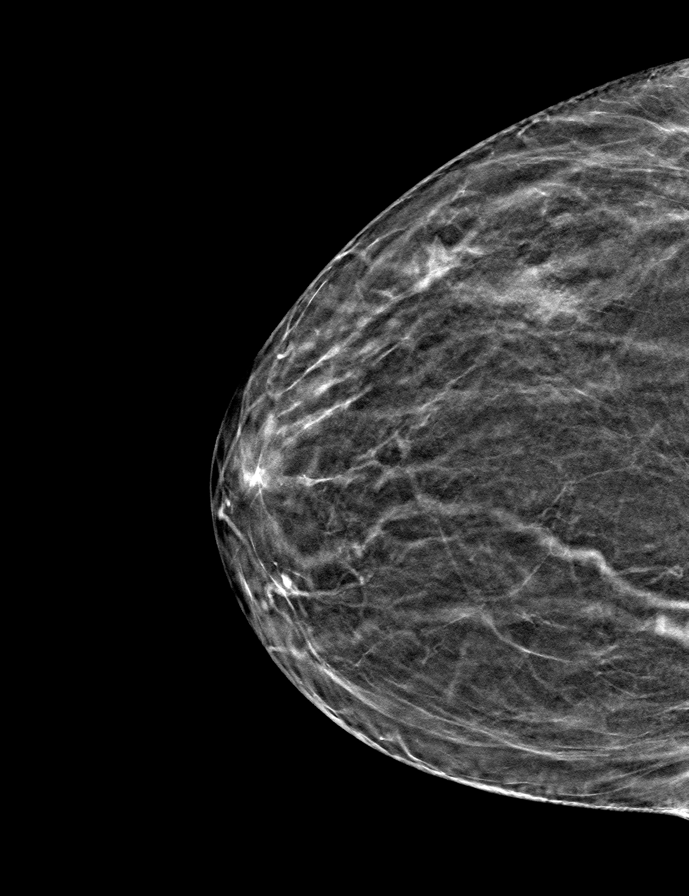

[9 of 24 positions shown; findings below may reference images not displayed]

ACR Breast Density Category b: There are scattered areas of
fibroglandular density.
FINDINGS: There are no findings suspicious for malignancy. Images were
processed with CAD.
IMPRESSION: No mammographic evidence of malignancy. A result letter of this
screening mammogram will be mailed directly to the patient.

RECOMMENDATION:
Screening mammogram in one year. (Code:[1T])

BI-RADS CATEGORY  1: Negative.

## 2017-10-12 ENCOUNTER — Encounter: Payer: Self-pay | Admitting: Gastroenterology

## 2017-10-12 ENCOUNTER — Ambulatory Visit (AMBULATORY_SURGERY_CENTER): Payer: BC Managed Care – PPO | Admitting: Gastroenterology

## 2017-10-12 VITALS — BP 116/74 | HR 64 | Temp 97.1°F | Resp 16 | Ht 64.0 in | Wt 119.0 lb

## 2017-10-12 DIAGNOSIS — D122 Benign neoplasm of ascending colon: Secondary | ICD-10-CM | POA: Diagnosis not present

## 2017-10-12 DIAGNOSIS — Z1211 Encounter for screening for malignant neoplasm of colon: Secondary | ICD-10-CM | POA: Diagnosis not present

## 2017-10-12 MED ORDER — SODIUM CHLORIDE 0.9 % IV SOLN: 500.0000 mL | Freq: Once | INTRAVENOUS | Status: DC

## 2017-10-12 NOTE — Op Note (Signed)
[facility] Patient Name: Jessica Salazar Procedure Date: 10/12/2017 7:31 AM MRN: [id number] Endoscopist: Justice Britain , MD Age: 52 Referring MD:  Date of Birth: 03/10/1965 Gender: Female Account #: [id number] Procedure:                Colonoscopy Indications:              Screening for colorectal malignant neoplasm Medicines:                Monitored Anesthesia Care Procedure:                Pre-Anesthesia Assessment:                           - Prior to the procedure, a History and Physical                            was performed, and patient medications and                            allergies were reviewed. The patient's tolerance of                            previous anesthesia was also reviewed. The risks                            and benefits of the procedure and the sedation                            options and risks were discussed with the patient.                            All questions were answered, and informed consent                            was obtained. Prior Anticoagulants: The patient has                            taken previous NSAID medication. ASA Grade                            Assessment: I - A normal, healthy patient. After                            reviewing the risks and benefits, the patient was                            deemed in satisfactory condition to undergo the                            procedure.                           After obtaining informed consent, the colonoscope  was passed under direct vision. Throughout the                            procedure, the patient's blood pressure, pulse, and                            oxygen saturations were monitored continuously. The                            Colonoscope was introduced through the anus and                            advanced to the the cecum, identified by                            appendiceal orifice and ileocecal valve. The                patient tolerated the procedure well. The quality                            of the bowel preparation was evaluated using the                            BBPS [facility] Bowel Preparation Scale) with scores                            of: Right Colon = 2 (minor amount of residual                            staining, small fragments of stool and/or opaque                            liquid, but mucosa seen well), Transverse Colon = 3                            (entire mucosa seen well with no residual staining,                            small fragments of stool or opaque liquid) and Left                            Colon = 3 (entire mucosa seen well with no residual                            staining, small fragments of stool or opaque                            liquid). The total BBPS score equals 8. The quality                            of the bowel preparation was good. The colonoscopy  was somewhat difficult due to a tortuous colon.                            Successful completion of the procedure was aided by                            changing the patient's position, using manual                            pressure, straightening and shortening the scope to                            obtain bowel loop reduction and using scope torsion. Scope In: 3:61:44 AM Scope Out: 7:55:12 AM Scope Withdrawal Time: 0 hours 9 minutes 45 seconds  Total Procedure Duration: 0 hours 17 minutes 1 second  Findings:                 The digital rectal exam findings include                            non-thrombosed external hemorrhoids. Pertinent                            negatives include no palpable rectal lesions.                           A 2 mm polyp was found in the ascending colon. The                            polyp was sessile. The polyp was removed with a                            cold snare. Resection and retrieval were complete.                            Multiple small-mouthed diverticula were found in                            the recto-sigmoid colon, sigmoid colon, descending                            colon, transverse colon and ascending colon.                           Normal mucosa was found in the entire colon.                           Non-bleeding non-thrombosed external and internal                            hemorrhoids were found during retroflexion and                            during perianal exam. The hemorrhoids were Grade I                            (  internal hemorrhoids that do not prolapse). Complications:            No immediate complications. Estimated Blood Loss:     Estimated blood loss was minimal. Impression:               - Non-thrombosed external hemorrhoids found on                            digital rectal exam.                           - One 2 mm polyp in the ascending colon, removed                            with a cold snare. Resected and retrieved.                           - Diverticulosis in the recto-sigmoid colon, in the                            sigmoid colon, in the descending colon, in the                            transverse colon and in the ascending colon.                           - Normal mucosa in the entire examined colon.                           - Non-bleeding non-thrombosed external and internal                            hemorrhoids. Recommendation:           - The patient will be observed post-procedure,                            until all discharge criteria are met.                           - Discharge patient to home.                           - Patient has a contact number available for                            emergencies. The signs and symptoms of potential                            delayed complications were discussed with the                            patient. Return to normal activities tomorrow.                            Written discharge instructions were provided  to the                            patient.                           - Resume previous diet.                           - Continue present medications.                           - Await pathology results.                           - Repeat colonoscopy in 5-10 years for surveillance                            based on pathology results.                           - High fiber diet. Consider using fiber, for                            example Citrucel, Fibercon, Konsyl or Metamucil to                            decrease risk of hemorrhoidal issues.                           - The findings and recommendations were discussed                            with the patient.                           - The findings and recommendations were discussed                            with the designated responsible adult. Justice Britain, MD 10/12/2017 8:01:04 AM

## 2017-10-12 NOTE — Patient Instructions (Signed)
Please read handouts provided. Continue present medications. Consider using Fiber.      YOU HAD AN ENDOSCOPIC PROCEDURE TODAY AT [facility]:   Refer to the procedure report that was given to you for any specific questions about what was found during the examination.  If the procedure report does not answer your questions, please call your gastroenterologist to clarify.  If you requested that your care partner not be given the details of your procedure findings, then the procedure report has been included in a sealed envelope for you to review at your convenience later.  YOU SHOULD EXPECT: Some feelings of bloating in the abdomen. Passage of more gas than usual.  Walking can help get rid of the air that was put into your GI tract during the procedure and reduce the bloating. If you had a lower endoscopy (such as a colonoscopy or flexible sigmoidoscopy) you may notice spotting of blood in your stool or on the toilet paper. If you underwent a bowel prep for your procedure, you may not have a normal bowel movement for a few days.  Please Note:  You might notice some irritation and congestion in your nose or some drainage.  This is from the oxygen used during your procedure.  There is no need for concern and it should clear up in a day or so.  SYMPTOMS TO REPORT IMMEDIATELY:   Following lower endoscopy (colonoscopy or flexible sigmoidoscopy):  Excessive amounts of blood in the stool  Significant tenderness or worsening of abdominal pains  Swelling of the abdomen that is new, acute  Fever of 100F or higher   For urgent or emergent issues, a gastroenterologist can be reached at any hour by calling [phone].   DIET:  We do recommend a small meal at first, but then you may proceed to your regular diet.  Drink plenty of fluids but you should avoid alcoholic beverages for 24 hours.  ACTIVITY:  You should plan to take it easy for the rest of today and you should NOT DRIVE or  use heavy machinery until tomorrow (because of the sedation medicines used during the test).    FOLLOW UP: Our staff will call the number listed on your records the next business day following your procedure to check on you and address any questions or concerns that you may have regarding the information given to you following your procedure. If we do not reach you, we will leave a message.  However, if you are feeling well and you are not experiencing any problems, there is no need to return our call.  We will assume that you have returned to your regular daily activities without incident.  If any biopsies were taken you will be contacted by phone or by letter within the next 1-3 weeks.  Please call us at [phone] if you have not heard about the biopsies in 3 weeks.    SIGNATURES/CONFIDENTIALITY: You and/or your care partner have signed paperwork which will be entered into your electronic medical record.  These signatures attest to the fact that that the information above on your After Visit Summary has been reviewed and is understood.  Full responsibility of the confidentiality of this discharge information lies with you and/or your care-partner.

## 2017-10-12 NOTE — Progress Notes (Signed)
Called to room to assist during endoscopic procedure.  Patient ID and intended procedure confirmed with present staff. Received instructions for my participation in the procedure from the performing physician.  

## 2017-10-12 NOTE — Progress Notes (Signed)
Pt's states no medical or surgical changes since previsit or office visit. 

## 2017-10-12 NOTE — Progress Notes (Signed)
Report to PACU, RN, vss, BBS= Clear.  

## 2017-10-13 ENCOUNTER — Telehealth: Payer: Self-pay | Admitting: *Deleted

## 2017-10-13 NOTE — Telephone Encounter (Signed)
  Follow up Call-  Call back number 10/12/2017  Post procedure Call Back phone  # [phone]  Permission to leave phone message Yes  Some recent data might be hidden     Patient questions:  Do you have a fever, pain , or abdominal swelling? No. Pain Score  0 *  Have you tolerated food without any problems? Yes.    Have you been able to return to your normal activities? Yes.    Do you have any questions about your discharge instructions: Diet   No. Medications  No. Follow up visit  No.  Do you have questions or concerns about your Care? No.  Actions: * If pain score is 4 or above: No action needed, pain <4.

## 2017-10-20 ENCOUNTER — Encounter: Payer: Self-pay | Admitting: Gastroenterology

## 2018-02-17 ENCOUNTER — Encounter: Payer: Self-pay | Admitting: Family

## 2018-02-17 ENCOUNTER — Other Ambulatory Visit: Payer: Self-pay | Admitting: Family

## 2018-02-17 MED ORDER — CIPROFLOXACIN HCL 500 MG PO TABS: 500.0000 mg | ORAL_TABLET | Freq: Two times a day (BID) | ORAL | 0 refills | Status: DC

## 2018-03-06 ENCOUNTER — Encounter: Payer: Self-pay | Admitting: Family

## 2018-03-06 ENCOUNTER — Other Ambulatory Visit (INDEPENDENT_AMBULATORY_CARE_PROVIDER_SITE_OTHER): Payer: BC Managed Care – PPO

## 2018-03-06 ENCOUNTER — Ambulatory Visit: Payer: BC Managed Care – PPO | Admitting: Family

## 2018-03-06 ENCOUNTER — Other Ambulatory Visit: Payer: Self-pay

## 2018-03-06 VITALS — BP 112/76 | HR 59 | Temp 97.4°F | Ht 64.0 in | Wt 122.0 lb

## 2018-03-06 DIAGNOSIS — R5383 Other fatigue: Secondary | ICD-10-CM

## 2018-03-06 LAB — CBC WITH DIFFERENTIAL/PLATELET
Basophils Absolute: 0 10*3/uL (ref 0.0–0.1)
Basophils Relative: 0.8 % (ref 0.0–3.0)
Eosinophils Absolute: 0.2 10*3/uL (ref 0.0–0.7)
Eosinophils Relative: 3 % (ref 0.0–5.0)
HCT: 38.1 % (ref 36.0–46.0)
HEMOGLOBIN: 12.8 g/dL (ref 12.0–15.0)
Lymphocytes Relative: 26.7 % (ref 12.0–46.0)
Lymphs Abs: 1.5 10*3/uL (ref 0.7–4.0)
MCHC: 33.7 g/dL (ref 30.0–36.0)
MCV: 89.6 fl (ref 78.0–100.0)
Monocytes Absolute: 0.6 10*3/uL (ref 0.1–1.0)
Monocytes Relative: 10.1 % (ref 3.0–12.0)
Neutro Abs: 3.4 10*3/uL (ref 1.4–7.7)
Neutrophils Relative %: 59.4 % (ref 43.0–77.0)
Platelets: 222 10*3/uL (ref 150.0–400.0)
RBC: 4.26 Mil/uL (ref 3.87–5.11)
RDW: 13.3 % (ref 11.5–15.5)
WBC: 5.7 10*3/uL (ref 4.0–10.5)

## 2018-03-06 LAB — COMPREHENSIVE METABOLIC PANEL
ALK PHOS: 61 U/L (ref 39–117)
ALT: 12 U/L (ref 0–35)
AST: 17 U/L (ref 0–37)
Albumin: 4.5 g/dL (ref 3.5–5.2)
BUN: 12 mg/dL (ref 6–23)
CALCIUM: 9.6 mg/dL (ref 8.4–10.5)
CO2: 28 mEq/L (ref 19–32)
Chloride: 106 mEq/L (ref 96–112)
Creatinine, Ser: 0.51 mg/dL (ref 0.40–1.20)
GFR: 125.31 mL/min (ref >60.00)
Glucose, Bld: 79 mg/dL (ref 70–99)
Potassium: 4.2 mEq/L (ref 3.5–5.1)
Sodium: 141 mEq/L (ref 135–145)
Total Bilirubin: 1.1 mg/dL (ref 0.2–1.2)
Total Protein: 7.2 g/dL (ref 6.0–8.3)

## 2018-03-06 LAB — TSH: TSH: 1.14 u[IU]/mL (ref 0.35–4.50)

## 2018-03-06 LAB — VITAMIN B12: Vitamin B-12: 397 pg/mL (ref 211–911)

## 2018-03-06 LAB — VITAMIN D 25 HYDROXY (VIT D DEFICIENCY, FRACTURES): VITD: 38.11 ng/mL (ref 30.00–100.00)

## 2018-03-06 NOTE — Progress Notes (Signed)
Jessica Salazar is a 53 y.o. female with the following history as recorded in EpicCare:  Patient Active Problem List   Diagnosis Date Noted  . Dysesthesia 06/04/2016  . Urinary frequency 06/04/2016  . Insomnia 06/04/2016  . Greater trochanteric bursitis of right hip 06/10/2015  . Gait abnormality 12/30/2014  . Multiple sclerosis ([place]) 09/25/2010    Current Outpatient Medications  Medication Sig Dispense Refill  . Multiple Vitamins-Minerals (MULTIVITAMIN GUMMIES ADULT PO) Take by mouth. Olly gummy multivitamin 2 po daily     No current facility-administered medications for this visit.     Allergies: Patient has no known allergies.  Past Medical History:  Diagnosis Date  . Arthritis   . Constipation    due to MS  . Heart murmur    MVP  . History of recurrent UTIs   . Hypotension   . Mitral valve prolapse   . Multiple sclerosis ([place])   . Neuromuscular disorder ([place])    has /NMS stimulator   . Vision abnormalities     Past Surgical History:  Procedure Laterality Date  . COLONOSCOPY     last > 10 yrs ago in [place] - done for constipation  . MENISCUS REPAIR Right   . right knee replacement Right 2016   right TOtal knee relacement   . UPPER GASTROINTESTINAL ENDOSCOPY      Family History  Problem Relation Age of Onset  . Heart attack Father   . Colon polyps Neg Hx   . Colon cancer Neg Hx   . Esophageal cancer Neg Hx   . Rectal cancer Neg Hx   . Stomach cancer Neg Hx     Social History   Tobacco Use  . Smoking status: Former Research scientist (life sciences)  . Smokeless tobacco: Never Used  Substance Use Topics  . Alcohol use: Yes    Comment: Rare    Subjective:  Patient notes she has been having increased problems with her MS recently; has been feeling more tired recently- having difficulty acclimating to weather changes; worried that she might be dehydrated; requesting to get her labs re-checked; notes is not currently under care of neurology- planning to transfer care to provider at  [facility];  Has been trying to exercise more regularly recently- just feeling like she needs some energy; Is sleeping well; Admits she is not drinking enough water; Does not want to update urine culture- has no concerns that she has an infection.     Objective:  Vitals:   03/06/18 0858  BP: 112/76  Pulse: (!) 59  Temp: (!) 97.4 F (36.3 C)  TempSrc: Oral  SpO2: 96%  Weight: 122 lb (55.3 kg)  Height: '5\' 4"'$  (1.626 m)    General: Well developed, well nourished, in no acute distress  Skin : Warm and dry.  Head: Normocephalic and atraumatic  Lungs: Respirations unlabored; clear to auscultation bilaterally without wheeze, rales, rhonchi  CVS exam: normal rate and regular rhythm.  Neurologic: Alert and oriented; speech intact; face symmetrical; moves all extremities well; CNII-XII intact without focal deficit   Assessment:  1. Other fatigue     Plan:  Concern for MS flare- patient does not have current neurologist; she is suspicious that needs B12 injection; will update labs today and follow-up to be determined; encouraged to schedule with neurologist at [facility] where she wants to establish care;   No follow-ups on file.  Orders Placed This Encounter  Procedures  . CBC w/Diff    Standing Status:   Future  Standing Expiration Date:   03/06/2019  . Comp Met (CMET)    Standing Status:   Future    Standing Expiration Date:   03/06/2019  . TSH    Standing Status:   Future    Standing Expiration Date:   03/06/2019  . Vitamin D (25 hydroxy)    Standing Status:   Future    Standing Expiration Date:   03/06/2019  . B12    Standing Status:   Future    Standing Expiration Date:   03/06/2019    Requested Prescriptions    No prescriptions requested or ordered in this encounter

## 2018-08-17 ENCOUNTER — Encounter: Payer: Self-pay | Admitting: Family

## 2018-08-22 ENCOUNTER — Ambulatory Visit (INDEPENDENT_AMBULATORY_CARE_PROVIDER_SITE_OTHER): Payer: BC Managed Care – PPO | Admitting: Family

## 2018-08-22 ENCOUNTER — Encounter: Payer: Self-pay | Admitting: Family

## 2018-08-22 DIAGNOSIS — G35 Multiple sclerosis: Secondary | ICD-10-CM | POA: Diagnosis not present

## 2018-08-22 MED ORDER — PREDNISONE 10 MG (21) PO TBPK: ORAL_TABLET | ORAL | 0 refills | Status: DC

## 2018-08-22 NOTE — Progress Notes (Signed)
Jessica Salazar is a 53 y.o. female with the following history as recorded in EpicCare:  Patient Active Problem List   Diagnosis Date Noted  . Dysesthesia 06/04/2016  . Urinary frequency 06/04/2016  . Insomnia 06/04/2016  . Greater trochanteric bursitis of right hip 06/10/2015  . Gait abnormality 12/30/2014  . Multiple sclerosis ([place]) 09/25/2010    Current Outpatient Medications  Medication Sig Dispense Refill  . Multiple Vitamins-Minerals (MULTIVITAMIN GUMMIES ADULT PO) Take by mouth. Olly gummy multivitamin 2 po daily    . predniSONE (STERAPRED UNI-PAK 21 TAB) 10 MG (21) TBPK tablet Taper as directed 21 tablet 0   No current facility-administered medications for this visit.     Allergies: Patient has no known allergies.  Past Medical History:  Diagnosis Date  . Arthritis   . Constipation    due to MS  . Heart murmur    MVP  . History of recurrent UTIs   . Hypotension   . Mitral valve prolapse   . Multiple sclerosis ([place])   . Neuromuscular disorder ([place])    has /NMS stimulator   . Vision abnormalities     Past Surgical History:  Procedure Laterality Date  . COLONOSCOPY     last > 10 yrs ago in [place] - done for constipation  . MENISCUS REPAIR Right   . right knee replacement Right 2016   right TOtal knee relacement   . UPPER GASTROINTESTINAL ENDOSCOPY      Family History  Problem Relation Age of Onset  . Heart attack Father   . Colon polyps Neg Hx   . Colon cancer Neg Hx   . Esophageal cancer Neg Hx   . Rectal cancer Neg Hx   . Stomach cancer Neg Hx     Social History   Tobacco Use  . Smoking status: Former Research scientist (life sciences)  . Smokeless tobacco: Never Used  Substance Use Topics  . Alcohol use: Yes    Comment: Rare    Subjective:    I connected with Jessica Salazar on 11/07/23 at  8:40 AM EDT by a video enabled telemedicine application and verified that I am speaking with the correct person using two identifiers. Patient and I are the only 2 people on the video  call.    I discussed the limitations of evaluation and management by telemedicine and the availability of in person appointments. The patient expressed understanding and agreed to proceed.  Feels that has been having MS flare for the past 2-3 weeks; increased sense of right sided weakness; no urinary symptoms, no fever; requesting treatment with prednisone pack; last MRI was 2 years ago; Is planning to establish with [facility] but will not be able to see her until October 2020;     Objective:  There were no vitals filed for this visit.  General: Well developed, well nourished, in no acute distress  Head: Normocephalic and atraumatic  Lungs: Respirations unlabored;  Neurologic: Alert and oriented; speech intact; face symmetrical;   Assessment:  1. Multiple sclerosis ([place])     Plan:  Rx for Prednisone taper pack to treat acute flare; keep planned follow-up with neurology at [facility] for later this fall; follow-up worse, no better in the interim.   No follow-ups on file.  No orders of the defined types were placed in this encounter.   Requested Prescriptions   Signed Prescriptions Disp Refills  . predniSONE (STERAPRED UNI-PAK 21 TAB) 10 MG (21) TBPK tablet 21 tablet 0    Sig: Taper  as directed

## 2018-08-25 ENCOUNTER — Encounter: Payer: Self-pay | Admitting: Family

## 2018-08-31 ENCOUNTER — Encounter: Payer: Self-pay | Admitting: Family

## 2018-08-31 ENCOUNTER — Other Ambulatory Visit: Payer: Self-pay | Admitting: Family

## 2018-08-31 MED ORDER — CIPROFLOXACIN HCL 500 MG PO TABS: 500.0000 mg | ORAL_TABLET | Freq: Two times a day (BID) | ORAL | 0 refills | Status: DC

## 2018-08-31 MED ORDER — PREDNISONE 10 MG (21) PO TBPK: ORAL_TABLET | ORAL | 0 refills | Status: DC

## 2018-09-07 ENCOUNTER — Encounter: Payer: Self-pay | Admitting: Family

## 2018-10-23 ENCOUNTER — Encounter: Payer: Self-pay | Admitting: Family

## 2018-10-23 ENCOUNTER — Encounter: Payer: Self-pay | Admitting: Neurology

## 2018-10-23 ENCOUNTER — Other Ambulatory Visit: Payer: Self-pay | Admitting: Family

## 2018-10-23 DIAGNOSIS — G35 Multiple sclerosis: Secondary | ICD-10-CM

## 2018-10-31 ENCOUNTER — Other Ambulatory Visit: Payer: Self-pay | Admitting: Family

## 2018-10-31 DIAGNOSIS — Z1231 Encounter for screening mammogram for malignant neoplasm of breast: Secondary | ICD-10-CM

## 2018-11-19 NOTE — Progress Notes (Signed)
NEUROLOGY CONSULTATION NOTE  [name] MRN: [id number] DOB: 1965/11/23  Referring provider: Marrian Salvage, [place] Primary care provider: Marrian Salvage, FNP  Reason for consult:  Multiple sclerosis  HISTORY OF PRESENT ILLNESS: Jessica Salazar is a 53 year old right-handed Caucasian woman who presents to establish care for multiple sclerosis.  History supplemented by prior neurologist's and referring provider's notes.  She was diagnosed with multiple sclerosis in 1993 after presenting with painful burning and tingling sensation in the upper back and chest as well as difficulty with balance.  Diagnosis was concerned via MRI.  She did not undergo lumbar puncture.  She had recurrent episodes of optic neuritis in the mid 2000s requiring treatment with Solu-Medrol.  Other exacerbations include paresthesias and increased difficulty with balance and typically right sided weakness.  She last saw a neurologist in 2018, Dr. Arlice Colt.  At that time, she was started on Aubagio but caused hair loss and didn't think she was getting relief so she stopped.  She initially was started on Avonex in late-90s or early 2000s.  She started Rebiff around 2003 but had decreased appetite and stopped after 2 years.  She was on Aubagio in 2018 for 2 months.  She does not like the idea of taking DMT.  2 months ago, she had a flare up of right sided weakness.  She received Solu-Medrol and symptoms dissipated 3 to 4 weeks but still wears an ankle brace because her foot turns in.  Her previous significant exacerbation occurred 2 to 3 years ago.  Current DMT:  none Past DMT:  Rebif (did not like the way it made her feel); Avonex (did not like the way it made her feel); Aubagio (hair loss)  Vision:  Left worse than right blurred vision. Motor:  Right leg weakness secondary to MS but also due to history of right leg fractures.  Some mild right hand weakness.  Heaviness bilaterally from below chest  down. Sensory:  Paresthesias of left hand and from below chest down to and including both feet. Pain:  Chronic pain in right leg secondary to prior trauma/fractures and hyperextension/pain of right knee pain. Gait:  Unsteady Bowel/Bladder:  Increased urinary frequency and urgency.  Needs to get up and urinate up to 4 times during the night.  Constipation. Fatigue:  Mild fatigue.  Able to function.   Cognition:  Mild word-finding issues Mood:  Denies depression or anxiety.  Imaging: 06/20/2016 (personally reviewed) MRI BRAIN WO W:  Multiple T2/FLAIR hyperintense foci in the cerebral hemispheres, primarily involving the periventricular and juxtacortical white matter.  No involvement of cerebellum and brainstem.  Normal enhancement pattern (reportedly stable compared to prior imaging in 2011). 06/20/2016 (personally reviewed) MRI CERVICAL WO W:  Hyperintense foci within spinal cord at C4-C5, C5, C6, C7 and possibly C3-C4 levels consistent with chronic demyelinating plaques (reportedly stable compared to prior imaging in 2011).  Labs: 03/06/2018:  Vitamin D 38.11; B12 397; TSH 1.14 06/05/2018:  Quantiferon TB Gold negative  Unknown family history of MS.  Her father passed away when she was a child and she is not familiar with her mother's side of family.  PAST MEDICAL HISTORY: Past Medical History:  Diagnosis Date  . Arthritis   . Constipation    due to MS  . Heart murmur    MVP  . History of recurrent UTIs   . Hypotension   . Mitral valve prolapse   . Multiple sclerosis ([place])   . Neuromuscular disorder (  [place])    has /NMS stimulator   . Vision abnormalities     PAST SURGICAL HISTORY: Past Surgical History:  Procedure Laterality Date  . COLONOSCOPY     last > 10 yrs ago in [place] - done for constipation  . MENISCUS REPAIR Right   . right knee replacement Right 2016   right TOtal knee relacement   . UPPER GASTROINTESTINAL ENDOSCOPY      MEDICATIONS: Current Outpatient  Medications on File Prior to Visit  Medication Sig Dispense Refill  . ciprofloxacin (CIPRO) 500 MG tablet Take 1 tablet (500 mg total) by mouth 2 (two) times daily. 10 tablet 0  . Multiple Vitamins-Minerals (MULTIVITAMIN GUMMIES ADULT PO) Take by mouth. Olly gummy multivitamin 2 po daily    . predniSONE (STERAPRED UNI-PAK 21 TAB) 10 MG (21) TBPK tablet Taper as directed 21 tablet 0   No current facility-administered medications on file prior to visit.     ALLERGIES: No Known Allergies  FAMILY HISTORY: Family History  Problem Relation Age of Onset  . Heart attack Father   . Colon polyps Neg Hx   . Colon cancer Neg Hx   . Esophageal cancer Neg Hx   . Rectal cancer Neg Hx   . Stomach cancer Neg Hx    SOCIAL HISTORY: Social History   Socioeconomic History  . Marital status: Single    Spouse name: Not on file  . Number of children: Not on file  . Years of education: Not on file  . Highest education level: Not on file  Occupational History  . Not on file  Social Needs  . Financial resource strain: Not on file  . Food insecurity    Worry: Not on file    Inability: Not on file  . Transportation needs    Medical: Not on file    Non-medical: Not on file  Tobacco Use  . Smoking status: Former Research scientist (life sciences)  . Smokeless tobacco: Never Used  Substance and Sexual Activity  . Alcohol use: Yes    Comment: Rare  . Drug use: No  . Sexual activity: Not on file  Lifestyle  . Physical activity    Days per week: Not on file    Minutes per session: Not on file  . Stress: Not on file  Relationships  . Social Herbalist on phone: Not on file    Gets together: Not on file    Attends religious service: Not on file    Active member of club or organization: Not on file    Attends meetings of clubs or organizations: Not on file    Relationship status: Not on file  . Intimate partner violence    Fear of current or ex partner: Not on file    Emotionally abused: Not on file     Physically abused: Not on file    Forced sexual activity: Not on file  Other Topics Concern  . Not on file  Social History Narrative  . Not on file    REVIEW OF SYSTEMS: Constitutional: some fatigue Eyes: No visual changes, double vision, eye pain Ear, nose and throat: No hearing loss, ear pain, nasal congestion, sore throat Cardiovascular: No chest pain, palpitations Respiratory:  No shortness of breath at rest or with exertion, wheezes GastrointestinaI: No nausea, vomiting, diarrhea, abdominal pain, fecal incontinence Genitourinary:  Increased urgency Musculoskeletal:  diffuse Integumentary: No rash, pruritus, skin lesions Neurological: as above Psychiatric: No depression, insomnia, anxiety Endocrine: No palpitations, fatigue, diaphoresis,  mood swings, change in appetite, change in weight, increased thirst Hematologic/Lymphatic:  No purpura, petechiae. Allergic/Immunologic: no itchy/runny eyes, nasal congestion, recent allergic reactions, rashes  PHYSICAL EXAM: Blood pressure 110/73, pulse (!) 52, height 5\' 4"  (1.626 m), weight 113 lb 9.6 oz (51.5 kg), SpO2 99 %. General: No acute distress.  Patient appears well-groomed.   Head:  Normocephalic/atraumatic Eyes:  fundi examined but not visualized Neck: supple, no paraspinal tenderness, full range of motion Back: No paraspinal tenderness Heart: regular rate and rhythm Lungs: Clear to auscultation bilaterally. Vascular: No carotid bruits. Neurological Exam: Mental status: alert and oriented to person, place, and time, recent and remote memory intact, fund of knowledge intact, attention and concentration intact, speech fluent and not dysarthric, language intact. Cranial nerves: CN I: not tested CN II: pupils equal, round and reactive to light, right upper quadrant vision loss in left eye. CN III, IV, VI:  full range of motion, no nystagmus, no ptosis CN V: reduced right V2-V3  CN VII: upper and lower face symmetric CN VIII:  hearing intact CN IX, X: gag intact, uvula midline CN XI: sternocleidomastoid and trapezius muscles intact CN XII: tongue midline Bulk & Tone: atrophy of right quadricep, no fasciculations. Motor:  3/5 right hip flexion, otherwise 4/5 right lower extremity; otherwise 5-/5 throughout Sensation:  Pinprick sensation reduced up to above knees bilaterally.  Reduced vibratory sensation in feet up to knees. Deep Tendon Reflexes:  3+ throughout (right slightly greater than left), bilateral Babinski Finger to nose testing:  Without dysmetria.   Heel to shin:  Without dysmetria.   Gait:  Wide-based gait.  Right hemiplegic.  Romberg with sway.  IMPRESSION: Multiple sclerosis  PLAN: 1.  After discussion, we will start Ocrevus 2.  Check Hepatitis B antibodies and vitamin D 3.  Check new baseline MRI brain/cervical spine with and without contrast. 4.  Follow up in 6 months.  Thank you for allowing me to take part in the care of this patient.  Metta Clines, DO  CC: Marrian Salvage, [place]

## 2018-11-21 ENCOUNTER — Ambulatory Visit: Payer: BC Managed Care – PPO | Admitting: Neurology

## 2018-11-21 ENCOUNTER — Other Ambulatory Visit (INDEPENDENT_AMBULATORY_CARE_PROVIDER_SITE_OTHER): Payer: BC Managed Care – PPO

## 2018-11-21 ENCOUNTER — Encounter: Payer: Self-pay | Admitting: Neurology

## 2018-11-21 ENCOUNTER — Other Ambulatory Visit: Payer: Self-pay

## 2018-11-21 VITALS — BP 110/73 | HR 52 | Ht 64.0 in | Wt 113.6 lb

## 2018-11-21 DIAGNOSIS — G35 Multiple sclerosis: Secondary | ICD-10-CM

## 2018-11-21 LAB — VITAMIN D 25 HYDROXY (VIT D DEFICIENCY, FRACTURES): VITD: 42.68 ng/mL (ref 30.00–100.00)

## 2018-11-21 NOTE — Patient Instructions (Addendum)
1.  We will check MRI of brain, cervical spine with and without contrast 2.  We will check hepatitis B antibodies and vitamin D level 3.  We will set you up for Ocrevus- Please go to [email or web address] for more information 4.  Follow up in 6 months.

## 2018-11-22 ENCOUNTER — Telehealth: Payer: Self-pay

## 2018-11-22 LAB — HEPATITIS B SURFACE ANTIBODY,QUALITATIVE: Hep B S Ab: NONREACTIVE

## 2018-11-22 NOTE — Telephone Encounter (Signed)
-----   Message from Pieter Partridge, DO sent at 11/22/2018 12:26 PM EST ----- Hep B test is negative.  We can proceed with Ocrevus.  Vitamin D level is overall okay but I would like the dose to be a little higher.  Recommend starting over the counter D3 1000 IU daily.  Repeat level in 6 months (about a week prior to follow up)

## 2018-11-22 NOTE — Telephone Encounter (Signed)
Called spoke with patient she was informed of results. And will start Vitamin D.  ocrevus in process

## 2018-12-05 NOTE — Progress Notes (Signed)
HARD FAX FROM [employer] APPROVED   OCREVUS  EFFECTIVE DATE: 12/01/18 Clinical updated required by: 03/01/2019 Ref# [id number]  APPROVED PPA 12/01/18- 03/01/2019 [id number] Total units: 1600 units  Approval is only good for 6 months. Pt will need to find another infusion center not related to outpatient hospital. For future infusion  Form sent to be scan into chart

## 2018-12-12 ENCOUNTER — Other Ambulatory Visit: Payer: Self-pay | Admitting: Neurology

## 2018-12-12 ENCOUNTER — Telehealth: Payer: Self-pay

## 2018-12-12 ENCOUNTER — Telehealth: Payer: Self-pay | Admitting: Neurology

## 2018-12-12 ENCOUNTER — Other Ambulatory Visit: Payer: Self-pay

## 2018-12-12 MED ORDER — DIAZEPAM 5 MG PO TABS: 5.0000 mg | ORAL_TABLET | Freq: Once | ORAL | 0 refills | Status: DC

## 2018-12-12 NOTE — Telephone Encounter (Signed)
Called [facility] short stay to schedule patient infusion no answer left message to call office back. Patient has been approved Called patient to make her aware of this she was informed and also given number to call to schedule infusion.  Will also place orders

## 2018-12-12 NOTE — Progress Notes (Signed)
Pt approved for infusion at [facility] long short stay.  Order place

## 2018-12-12 NOTE — Telephone Encounter (Signed)
Please let her know that I sent in a prescription for a Valium to the [facility] on [address]  She will need a driver to and from the MRI facility.

## 2018-12-12 NOTE — Telephone Encounter (Signed)
Pt is scheduled for MRI this Saturday. She is requesting something to be sent to pharmacy on file to help keep her claim.   Will you be able to write her something for this?

## 2018-12-12 NOTE — Telephone Encounter (Signed)
Order place under orders only section  Under hospital orders

## 2018-12-12 NOTE — Telephone Encounter (Signed)
Patient is calling in about the Ocrevus infusion referral. She hasn't heard anything from them for scheduling. Can we find out what's taking so long? Thanks!

## 2018-12-13 ENCOUNTER — Other Ambulatory Visit (HOSPITAL_COMMUNITY): Payer: Self-pay | Admitting: General Practice

## 2018-12-13 NOTE — Telephone Encounter (Signed)
Call patient she was informed of provider response.

## 2018-12-16 ENCOUNTER — Ambulatory Visit
Admission: RE | Admit: 2018-12-16 | Discharge: 2018-12-16 | Disposition: A | Discharge status: Home / Self Care | Payer: BC Managed Care – PPO | Source: Ambulatory Visit | Attending: Neurology | Admitting: Neurology

## 2018-12-16 ENCOUNTER — Other Ambulatory Visit: Payer: Self-pay

## 2018-12-16 DIAGNOSIS — G35 Multiple sclerosis: Secondary | ICD-10-CM

## 2018-12-16 IMAGING — MR MR CERVICAL SPINE WO/W CM
4 of 8 series · 24 of 48 positions shown · IV contrast (multihance)
Comparison: None.

CLINICAL DATA: Follow-up examination for multiple sclerosis.

EXAM:
MRI HEAD WITHOUT AND WITH CONTRAST
MRI CERVICAL SPINE WITHOUT AND WITH CONTRAST
TECHNIQUE: Multiplanar, multiecho pulse sequences of the brain and surrounding
structures, and cervical spine, to include the craniocervical
junction and cervicothoracic junction, were obtained without and
with intravenous contrast.
CONTRAST:  10mL MULTIHANCE GADOBENATE DIMEGLUMINE 529 MG/ML IV SOLN

[Series 2: T1 · sagittal · 3.0mm · 0.82mm/px · 5 of 18 slices shown (1 of 2)]
[im 1/18]
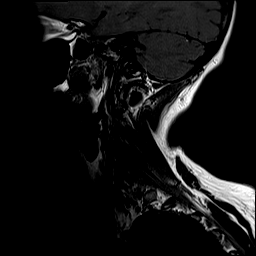
[im 5/18]
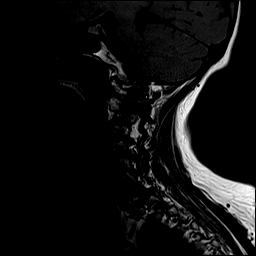
[im 9/18]
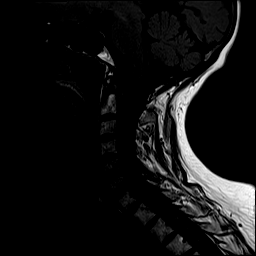
[im 13/18]
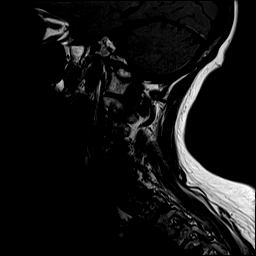
[im 18/18]
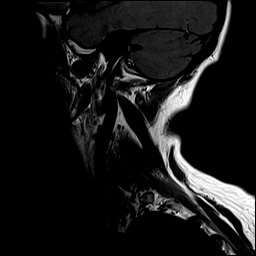

[Series 4: T2 · axial · 3.0mm · 0.70mm/px · z∈[-211,-126]mm · 7 of 25 slices shown (1 of 2)]
[im 1/25]
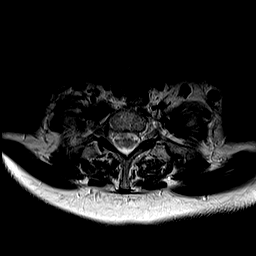
[im 5/25]
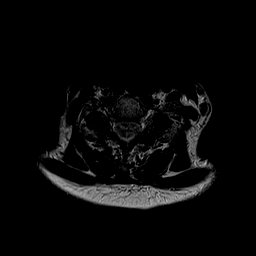
[im 9/25]
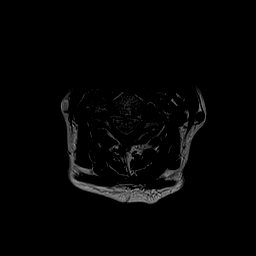
[im 13/25]
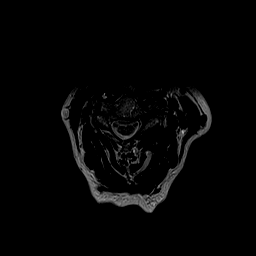
[im 17/25]
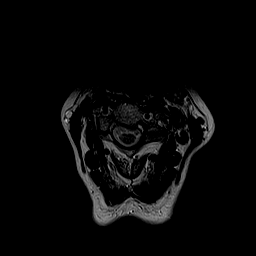
[im 21/25]
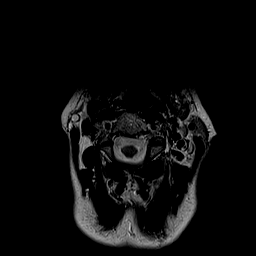
[im 25/25]
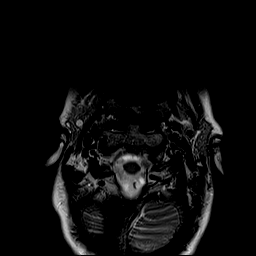

[Series 6: T1 · axial · 3.0mm · 0.35mm/px · z∈[-211,-126]mm · 7 of 25 slices shown (2 of 2)]
[im 1/25]
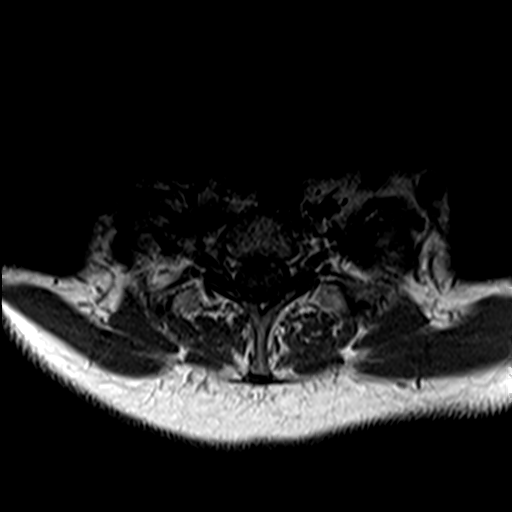
[im 5/25]
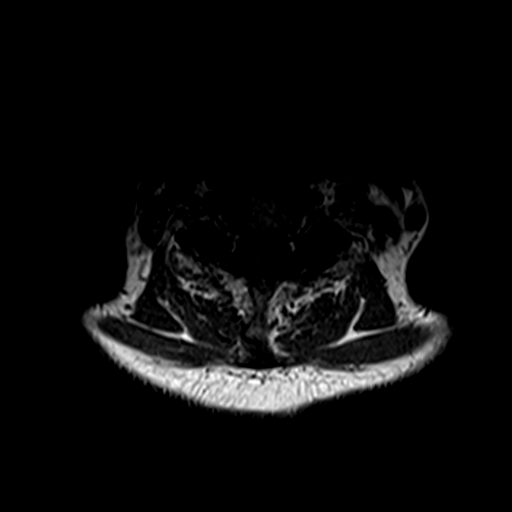
[im 9/25]
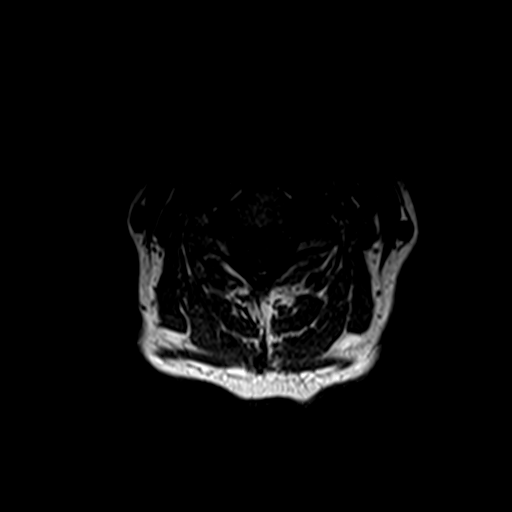
[im 13/25]
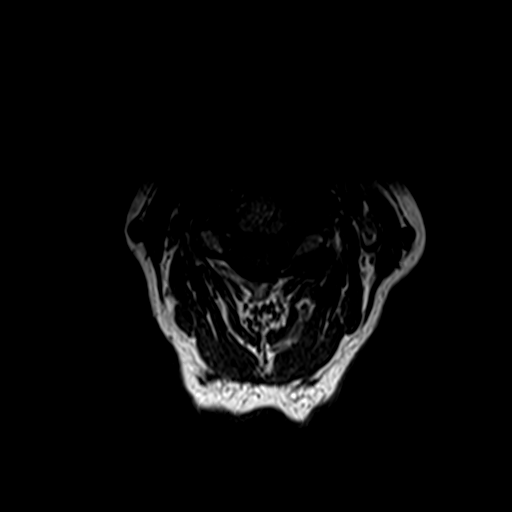
[im 17/25]
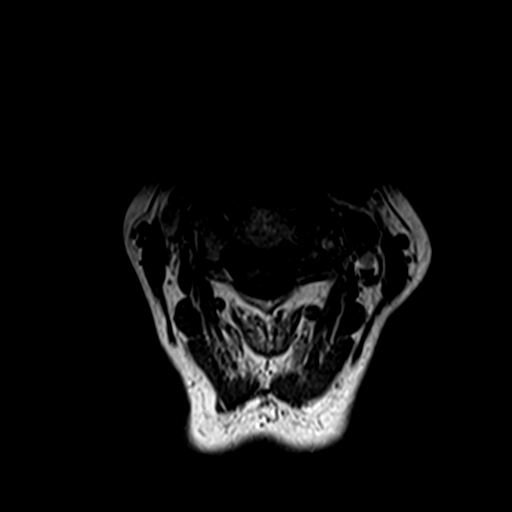
[im 21/25]
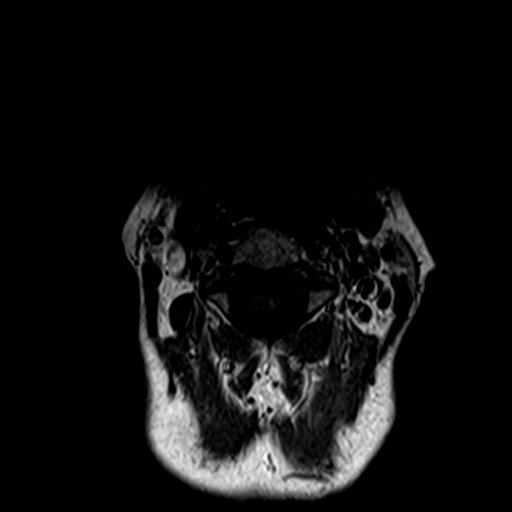
[im 25/25]
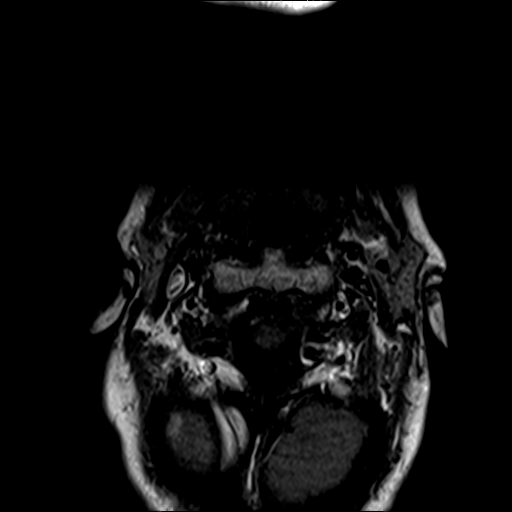

[Series 7: T2 · sagittal · 3.0mm · 0.41mm/px · 5 of 18 slices shown (2 of 2)]
[im 1/18]
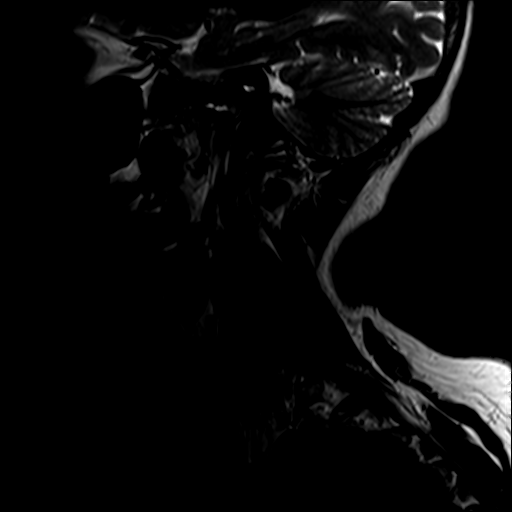
[im 5/18]
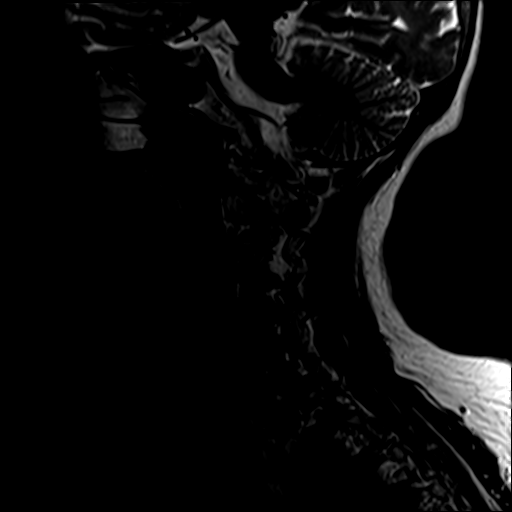
[im 9/18]
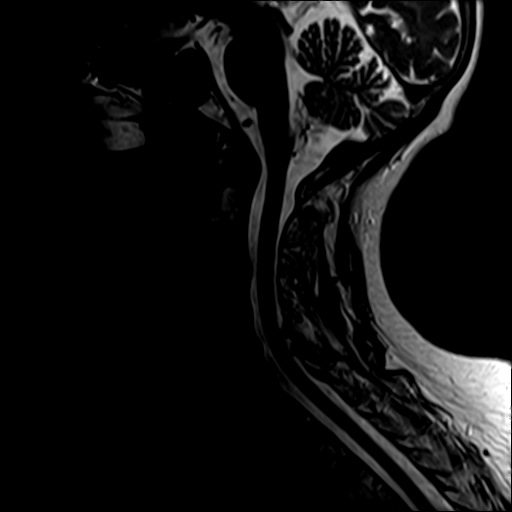
[im 13/18]
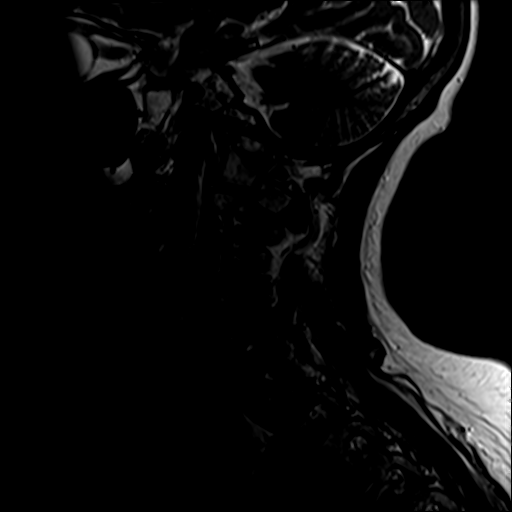
[im 18/18]
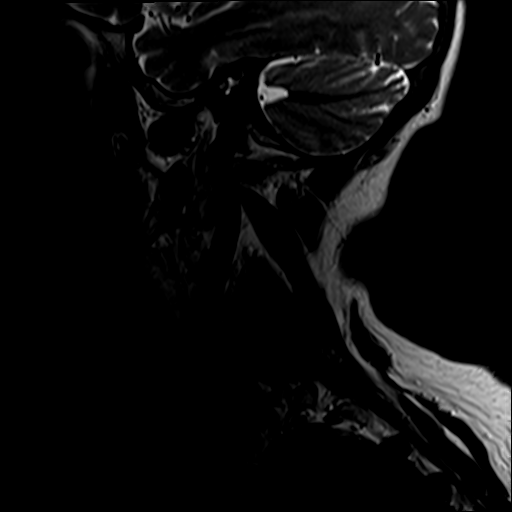

[24 of 48 positions shown; findings below may reference images not displayed]

FINDINGS: MRI HEAD FINDINGS

Brain: Cerebral volume stable, and remains within normal limits for
age. Again seen are multiple patchy T2/FLAIR hyperintense lesions
involving the periventricular, deep, and juxta cortical white matter
of both cerebral hemispheres, consistent with history of multiple
sclerosis. Most prominent of these lesions measures 17 mm and is
positioned at the left lentiform nucleus. Few subtle tiny
subcentimeter foci noted within the cerebellum bilaterally. In
comparison with previous exam and allowing for differences in
technique, overall appearance is felt to not be significantly
changed or progressed. No abnormal enhancement or restricted
diffusion to suggest active demyelination. No definite new lesions.

No evidence for acute or subacute infarct. Gray-white matter
differentiation maintained. No encephalomalacia to suggest chronic
cortical infarction. No evidence for acute or chronic intracranial
hemorrhage.

No mass lesion, midline shift or mass effect. No hydrocephalus. No
extra-axial fluid collection. Pituitary gland within normal limits.
No other abnormal enhancement within the brain.

Vascular: Major intracranial vascular flow voids are maintained.

Skull and upper cervical spine: Craniocervical junction within
normal limits. Bone marrow signal intensity normal. No scalp soft
tissue abnormality.

Sinuses/Orbits: Globes and orbital soft tissues within normal
limits. Paranasal sinuses are largely clear. No mastoid effusion.
Inner ear structures grossly normal.

Other: None.

MRI CERVICAL SPINE FINDINGS

Alignment: Exaggeration of the normal cervical lordosis. Trace grade
1 anterolisthesis of C7 on T1, chronic and facet mediated.

Vertebrae: Vertebral body height maintained without evidence for
acute or chronic fracture. Bone marrow signal intensity within
normal limits. No discrete or worrisome osseous lesions. No abnormal
marrow edema or enhancement.

Cord: Extensive T2/STIR signal abnormality seen throughout the
cervical spinal cord, consistent with history of demyelinating
disease. These are seen as follows. Diffuse hazy cord signal
abnormality within the ventral and right hemi cord at the level of
C2. Patchy cord signal abnormality within the right hemi cord at the
level of C2-3. Patchy signal abnormality within the bilateral cord
at the levels of C3-4 and C4-5. Patchy signal abnormality within the
left hemi cord at the level of C5-6. Diffuse signal abnormality
within the bilateral hemicord at the level of C6-7, left greater
than right. In comparison with previous exam, appearance is likely
little interval changed. No abnormal enhancement to suggest active
demyelination. Underlying cord caliber within normal limits.

Posterior Fossa, vertebral arteries, paraspinal tissues:
Craniocervical junction within normal limits. Paraspinous and
prevertebral soft tissues are normal. Normal intravascular flow
voids seen within the vertebral arteries bilaterally.

Disc levels:

C2-C3: Unremarkable.

C3-C4: Left foraminal disc osteophyte complex flattens and mildly
effaces the left ventral thecal sac. Superimposed left-sided facet
degeneration. Resultant moderate left C4 foraminal stenosis. No
significant canal or right foraminal narrowing.

C4-C5: Minimal disc bulge with uncovertebral hypertrophy. Left
greater than right facet degeneration. No significant spinal
stenosis. Mild to moderate bilateral C5 foraminal stenosis.

C5-C6: Diffuse disc bulge with right greater than left uncovertebral
hypertrophy. Bilateral facet degeneration. No significant spinal
stenosis. Mild to moderate bilateral C6 foraminal narrowing.

C6-C7: Chronic intervertebral disc space narrowing with diffuse disc
bulge and bilateral uncovertebral hypertrophy. Broad-based posterior
disc osteophyte flattens the ventral thecal sac without significant
spinal stenosis. Moderate bilateral C7 foraminal stenosis.

C7-T1: Trace facet mediated anterolisthesis. Prominent bilateral
facet hypertrophy. No significant stenosis.

Visualized upper thoracic spine demonstrates no significant finding.
IMPRESSION: MRI HEAD IMPRESSION:

1. Patchy T2/FLAIR hyperintensities involving the periventricular,
deep, and juxta cortical white matter of both cerebral hemispheres
as above, consistent with provided history of chronic multiple
sclerosis. Overall, appearance is similar as compared to previous
exam from [[facility]], without evidence for significant disease
progression. No evidence for active demyelination.
2. No other acute intracranial abnormality identified.

MRI CERVICAL SPINE IMPRESSION:

1. Extensive patchy signal abnormality throughout the cervical
spinal cord as detailed above, compatible with history of chronic
multiple sclerosis. Overall, appearance is similar as compared to
progression. No evidence for active demyelination.
2. Left foraminal disc osteophyte complex and facet degeneration at
C3-4 with resultant moderate left C4 foraminal stenosis.
3. Additional multifactorial degenerative changes with resultant
mild to moderate bilateral C5 through C7 foraminal narrowing as
above.

## 2018-12-16 IMAGING — MR MR HEAD WO/W CM
13 series · 43 of 48 positions shown · IV contrast (multihance)
Comparison: None.

CLINICAL DATA: Follow-up examination for multiple sclerosis.

EXAM:
MRI HEAD WITHOUT AND WITH CONTRAST
MRI CERVICAL SPINE WITHOUT AND WITH CONTRAST
TECHNIQUE: Multiplanar, multiecho pulse sequences of the brain and surrounding
structures, and cervical spine, to include the craniocervical
junction and cervicothoracic junction, were obtained without and
with intravenous contrast.
CONTRAST:  10mL MULTIHANCE GADOBENATE DIMEGLUMINE 529 MG/ML IV SOLN

[Series 2: T1 · sagittal · 5.0mm · 0.47mm/px · 1 of 24 slices shown]
[im 1/24]
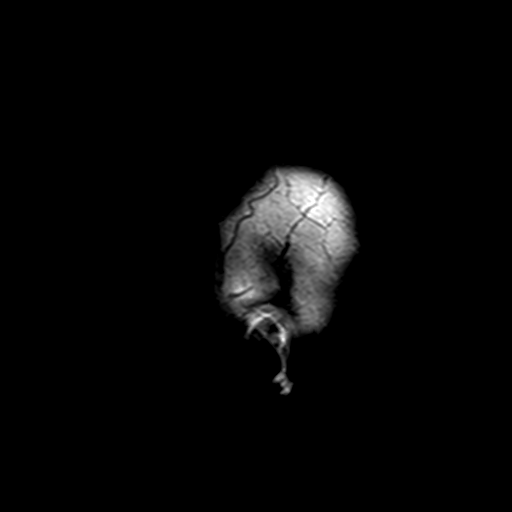

[Series 3: DWI · axial · 3.0mm · 1.88mm/px · z∈[-69,+89]mm · 6 of 108 slices shown (1 of 4)]
[im 1/108]
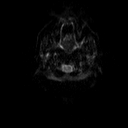
[im 22/108]
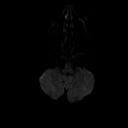
[im 43/108]
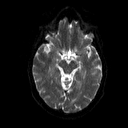
[im 65/108]
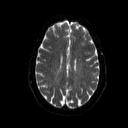
[im 86/108]
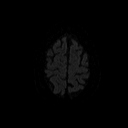
[im 108/108]
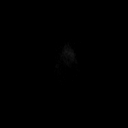

[Series 4: DWI · axial · 3.0mm · 1.88mm/px · z∈[-69,+89]mm · 3 of 54 slices shown (2 of 4)]
[im 1/54]
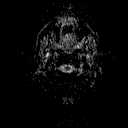
[im 27/54]
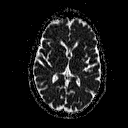
[im 54/54]
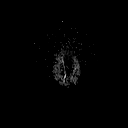

[Series 5: FLAIR · axial · 3.0mm · 0.47mm/px · z∈[-69,+89]mm · 2 of 35 slices shown (1 of 2)]
[im 1/35]
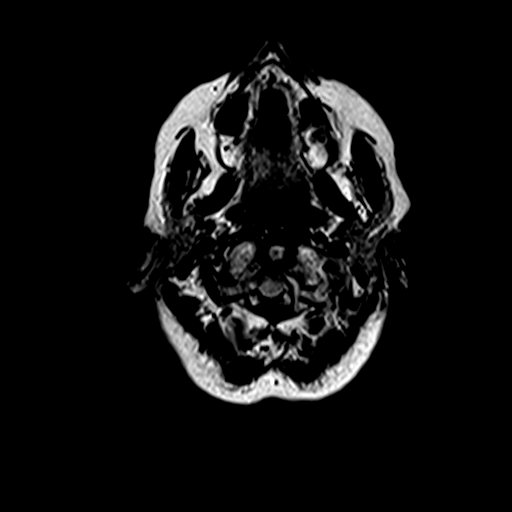
[im 35/35]
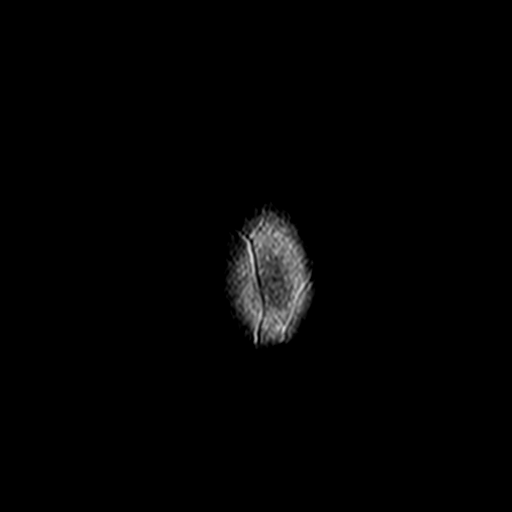

[Series 6: T2 · axial · 5.0mm · 0.62mm/px · 1 of 24 slices shown (1 of 2)]
[im 1/24]
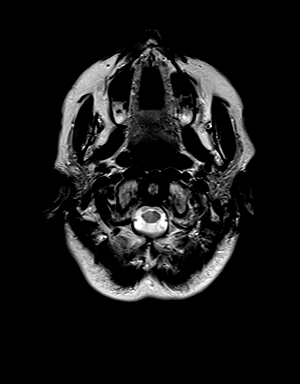

[Series 8: swi_images · axial · 5.0mm · 0.94mm/px · z∈[-67,+87]mm · 2 of 32 slices shown]
[im 1/32]
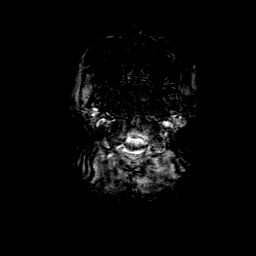
[im 32/32]
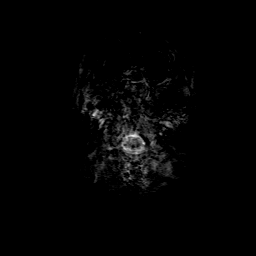

[Series 9: t1_mpr_tra · axial · 1.0mm · 0.75mm/px · z∈[-71,+87]mm · 8 of 160 slices shown]
[im 1/160]
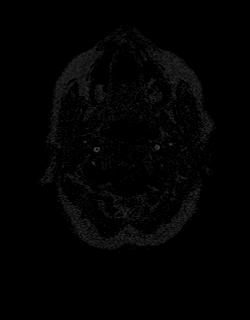
[im 18/160]
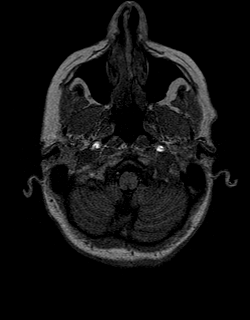
[im 54/160]
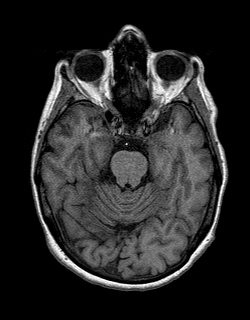
[im 71/160]
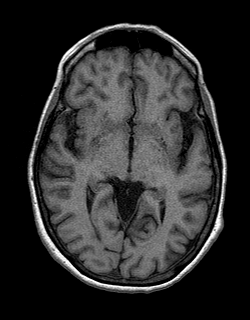
[im 89/160]
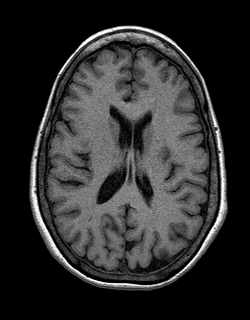
[im 107/160]
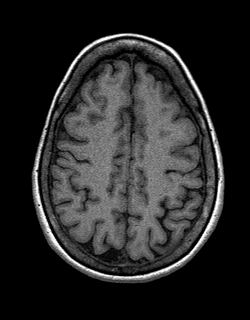
[im 142/160]
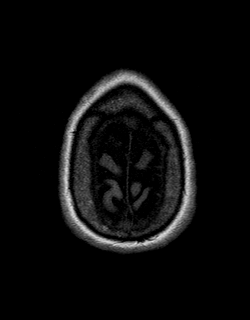
[im 160/160]
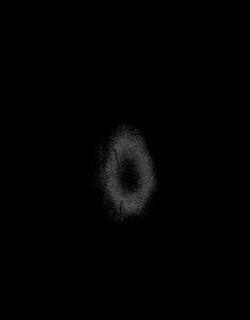

[Series 10: DWI · coronal · 5.0mm · 1.80mm/px · 5 of 75 slices shown (3 of 4)]
[im 1/75]
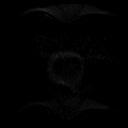
[im 19/75]
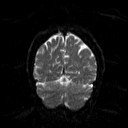
[im 38/75]
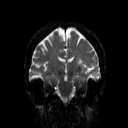
[im 56/75]
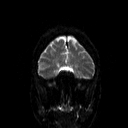
[im 75/75]
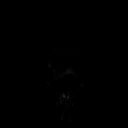

[Series 11: DWI · coronal · 5.0mm · 1.80mm/px · 2 of 38 slices shown (4 of 4)]
[im 1/38]
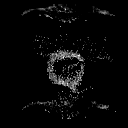
[im 38/38]
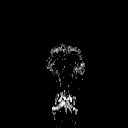

[Series 12: FLAIR · sagittal · 5.0mm · 0.45mm/px · 2 of 27 slices shown (2 of 2)]
[im 1/27]
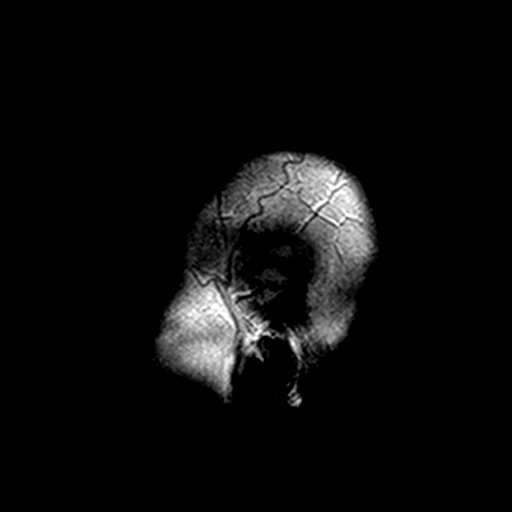
[im 27/27]
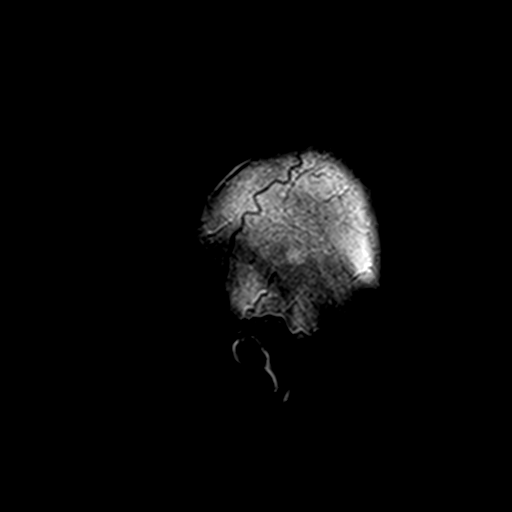

[Series 13: T2 · coronal · 5.0mm · 0.45mm/px · 2 of 29 slices shown (2 of 2)]
[im 1/29]
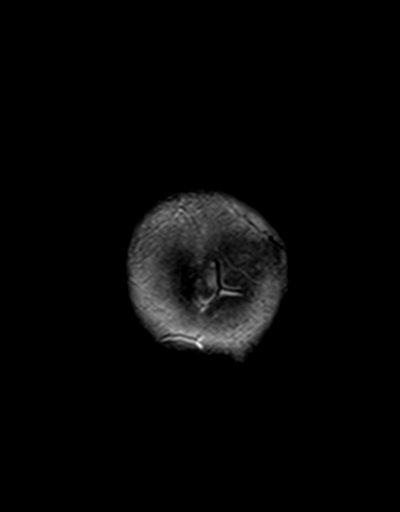
[im 29/29]
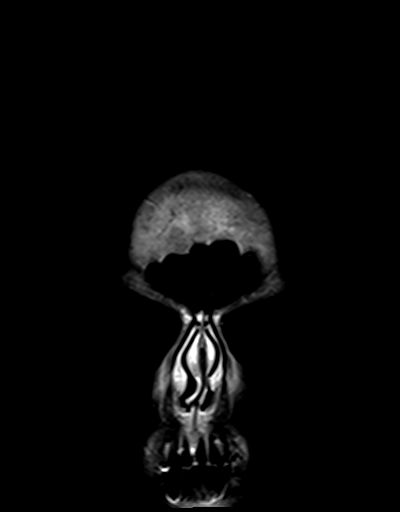

[Series 14: t1_mpr_tra brain · axial · 1.0mm · 0.75mm/px · z∈[-75,+84]mm · 8 of 160 slices shown]
[im 1/160]
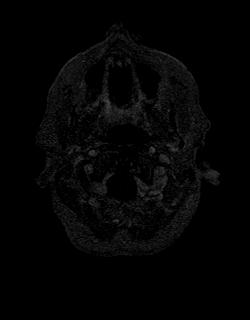
[im 18/160]
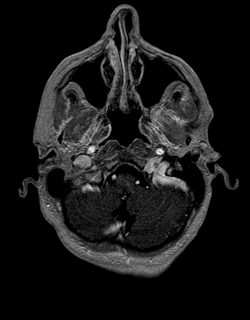
[im 54/160]
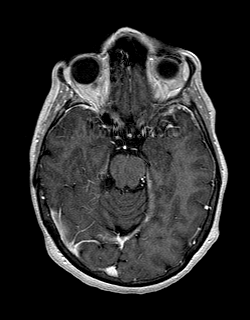
[im 71/160]
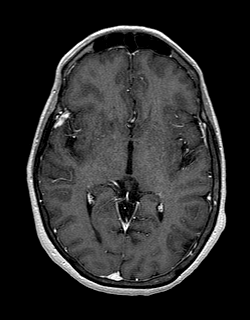
[im 89/160]
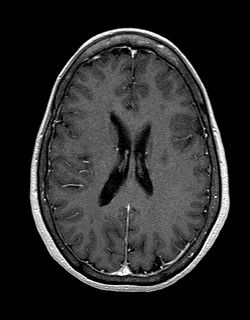
[im 107/160]
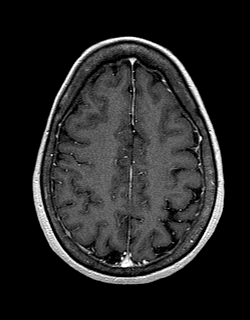
[im 142/160]
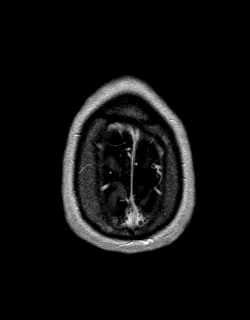
[im 160/160]
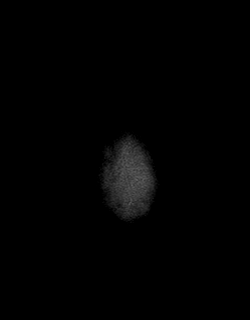

[Series 15: post cor brain · coronal · 5.0mm · 0.45mm/px · 1 of 30 slices shown]
[im 1/30]
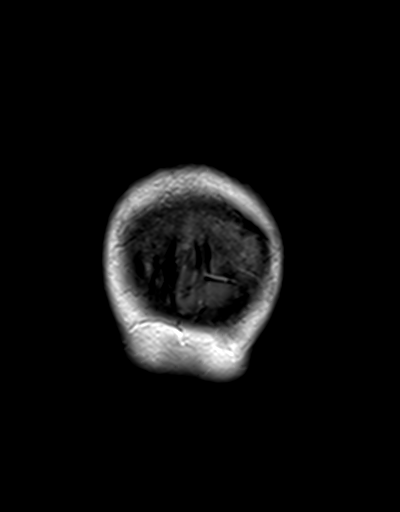

[43 of 48 positions shown; findings below may reference images not displayed]

FINDINGS: MRI HEAD FINDINGS

Brain: Cerebral volume stable, and remains within normal limits for
age. Again seen are multiple patchy T2/FLAIR hyperintense lesions
involving the periventricular, deep, and juxta cortical white matter
of both cerebral hemispheres, consistent with history of multiple
sclerosis. Most prominent of these lesions measures 17 mm and is
positioned at the left lentiform nucleus. Few subtle tiny
subcentimeter foci noted within the cerebellum bilaterally. In
comparison with previous exam and allowing for differences in
technique, overall appearance is felt to not be significantly
changed or progressed. No abnormal enhancement or restricted
diffusion to suggest active demyelination. No definite new lesions.

No evidence for acute or subacute infarct. Gray-white matter
differentiation maintained. No encephalomalacia to suggest chronic
cortical infarction. No evidence for acute or chronic intracranial
hemorrhage.

No mass lesion, midline shift or mass effect. No hydrocephalus. No
extra-axial fluid collection. Pituitary gland within normal limits.
No other abnormal enhancement within the brain.

Vascular: Major intracranial vascular flow voids are maintained.

Skull and upper cervical spine: Craniocervical junction within
normal limits. Bone marrow signal intensity normal. No scalp soft
tissue abnormality.

Sinuses/Orbits: Globes and orbital soft tissues within normal
limits. Paranasal sinuses are largely clear. No mastoid effusion.
Inner ear structures grossly normal.

Other: None.

MRI CERVICAL SPINE FINDINGS

Alignment: Exaggeration of the normal cervical lordosis. Trace grade
1 anterolisthesis of C7 on T1, chronic and facet mediated.

Vertebrae: Vertebral body height maintained without evidence for
acute or chronic fracture. Bone marrow signal intensity within
normal limits. No discrete or worrisome osseous lesions. No abnormal
marrow edema or enhancement.

Cord: Extensive T2/STIR signal abnormality seen throughout the
cervical spinal cord, consistent with history of demyelinating
disease. These are seen as follows. Diffuse hazy cord signal
abnormality within the ventral and right hemi cord at the level of
C2. Patchy cord signal abnormality within the right hemi cord at the
level of C2-3. Patchy signal abnormality within the bilateral cord
at the levels of C3-4 and C4-5. Patchy signal abnormality within the
left hemi cord at the level of C5-6. Diffuse signal abnormality
within the bilateral hemicord at the level of C6-7, left greater
than right. In comparison with previous exam, appearance is likely
little interval changed. No abnormal enhancement to suggest active
demyelination. Underlying cord caliber within normal limits.

Posterior Fossa, vertebral arteries, paraspinal tissues:
Craniocervical junction within normal limits. Paraspinous and
prevertebral soft tissues are normal. Normal intravascular flow
voids seen within the vertebral arteries bilaterally.

Disc levels:

C2-C3: Unremarkable.

C3-C4: Left foraminal disc osteophyte complex flattens and mildly
effaces the left ventral thecal sac. Superimposed left-sided facet
degeneration. Resultant moderate left C4 foraminal stenosis. No
significant canal or right foraminal narrowing.

C4-C5: Minimal disc bulge with uncovertebral hypertrophy. Left
greater than right facet degeneration. No significant spinal
stenosis. Mild to moderate bilateral C5 foraminal stenosis.

C5-C6: Diffuse disc bulge with right greater than left uncovertebral
hypertrophy. Bilateral facet degeneration. No significant spinal
stenosis. Mild to moderate bilateral C6 foraminal narrowing.

C6-C7: Chronic intervertebral disc space narrowing with diffuse disc
bulge and bilateral uncovertebral hypertrophy. Broad-based posterior
disc osteophyte flattens the ventral thecal sac without significant
spinal stenosis. Moderate bilateral C7 foraminal stenosis.

C7-T1: Trace facet mediated anterolisthesis. Prominent bilateral
facet hypertrophy. No significant stenosis.

Visualized upper thoracic spine demonstrates no significant finding.
IMPRESSION: MRI HEAD IMPRESSION:

1. Patchy T2/FLAIR hyperintensities involving the periventricular,
deep, and juxta cortical white matter of both cerebral hemispheres
as above, consistent with provided history of chronic multiple
sclerosis. Overall, appearance is similar as compared to previous
exam from [[facility]], without evidence for significant disease
progression. No evidence for active demyelination.
2. No other acute intracranial abnormality identified.

MRI CERVICAL SPINE IMPRESSION:

1. Extensive patchy signal abnormality throughout the cervical
spinal cord as detailed above, compatible with history of chronic
multiple sclerosis. Overall, appearance is similar as compared to
progression. No evidence for active demyelination.
2. Left foraminal disc osteophyte complex and facet degeneration at
C3-4 with resultant moderate left C4 foraminal stenosis.
3. Additional multifactorial degenerative changes with resultant
mild to moderate bilateral C5 through C7 foraminal narrowing as
above.

## 2018-12-16 MED ORDER — GADOBENATE DIMEGLUMINE 529 MG/ML IV SOLN
10.0000 mL | Freq: Once | INTRAVENOUS | Status: AC | PRN
  Administered 2018-12-16: 10:00:00 10 mL via INTRAVENOUS

## 2018-12-18 ENCOUNTER — Telehealth (HOSPITAL_COMMUNITY): Payer: Self-pay | Admitting: General Practice

## 2018-12-19 ENCOUNTER — Ambulatory Visit
Admission: RE | Admit: 2018-12-19 | Discharge: 2018-12-19 | Disposition: A | Discharge status: Home / Self Care | Payer: BC Managed Care – PPO | Source: Ambulatory Visit | Attending: Family | Admitting: Family

## 2018-12-19 ENCOUNTER — Other Ambulatory Visit: Payer: Self-pay

## 2018-12-19 DIAGNOSIS — Z1231 Encounter for screening mammogram for malignant neoplasm of breast: Secondary | ICD-10-CM

## 2018-12-19 IMAGING — MG DIGITAL SCREENING BILAT W/ TOMO W/ CAD
8 series · 8 of 24 positions shown · non-contrast
Comparison: Previous exam(s).

CLINICAL DATA: Screening.

EXAM:
DIGITAL SCREENING BILATERAL MAMMOGRAM WITH TOMO AND CAD

[L CC synth-2D]
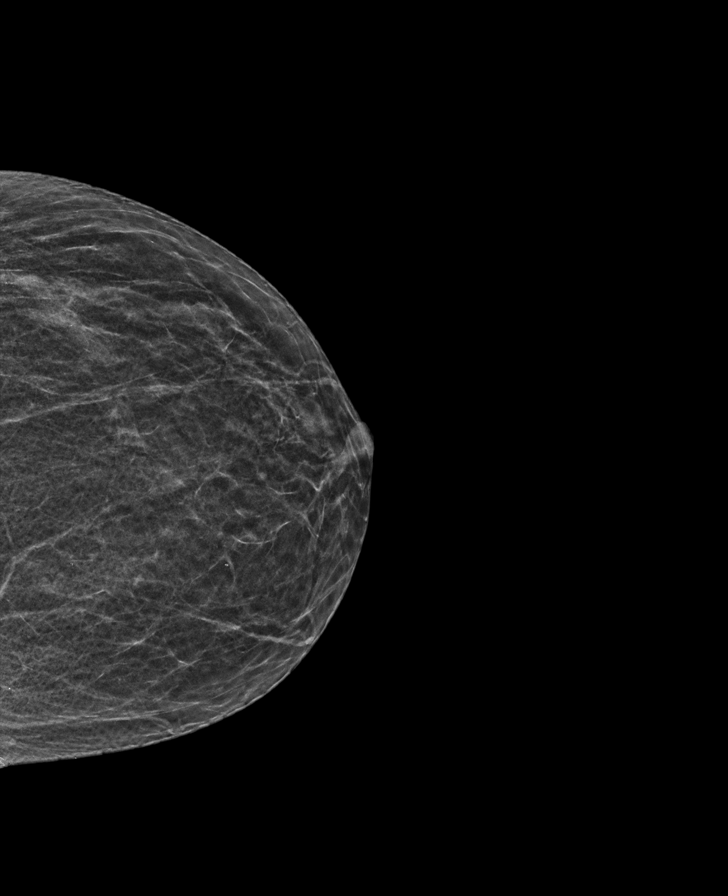

[L MLO synth-2D]
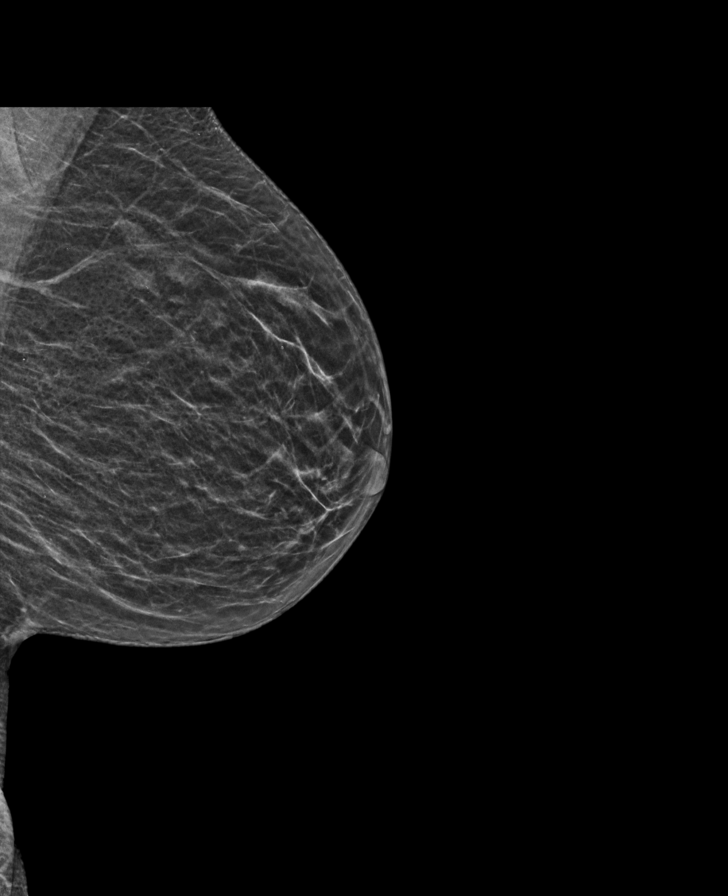

[R MLO synth-2D]
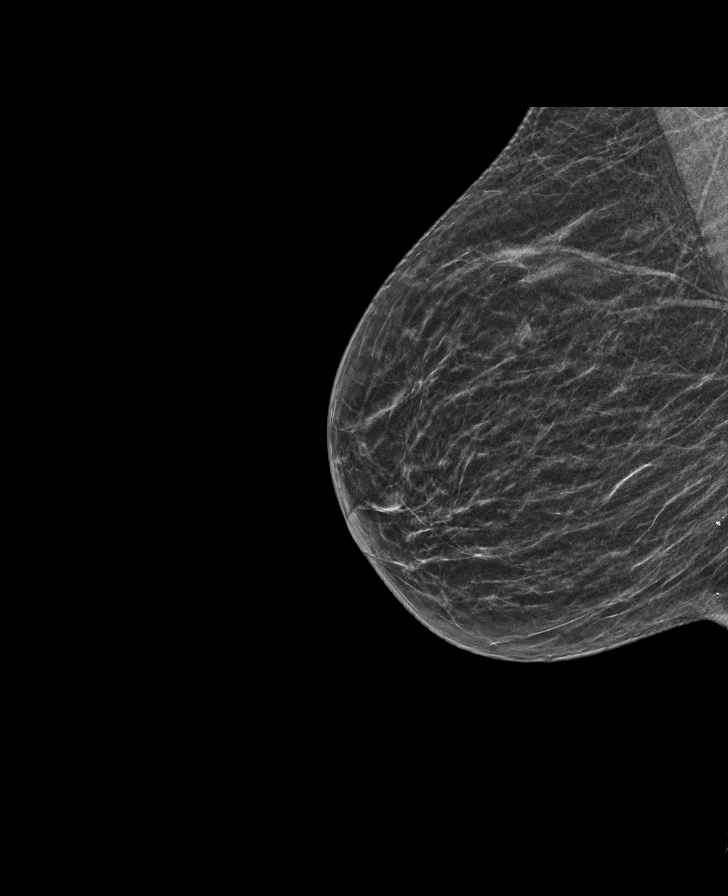

[R CC synth-2D]
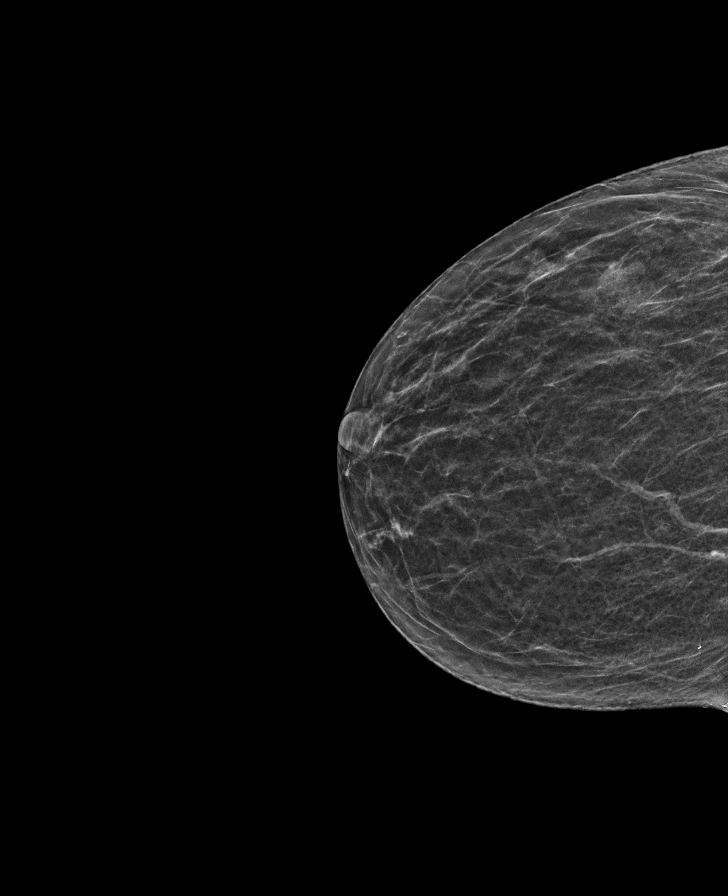

[L MLO tomo · tomo slice 21/42.0]
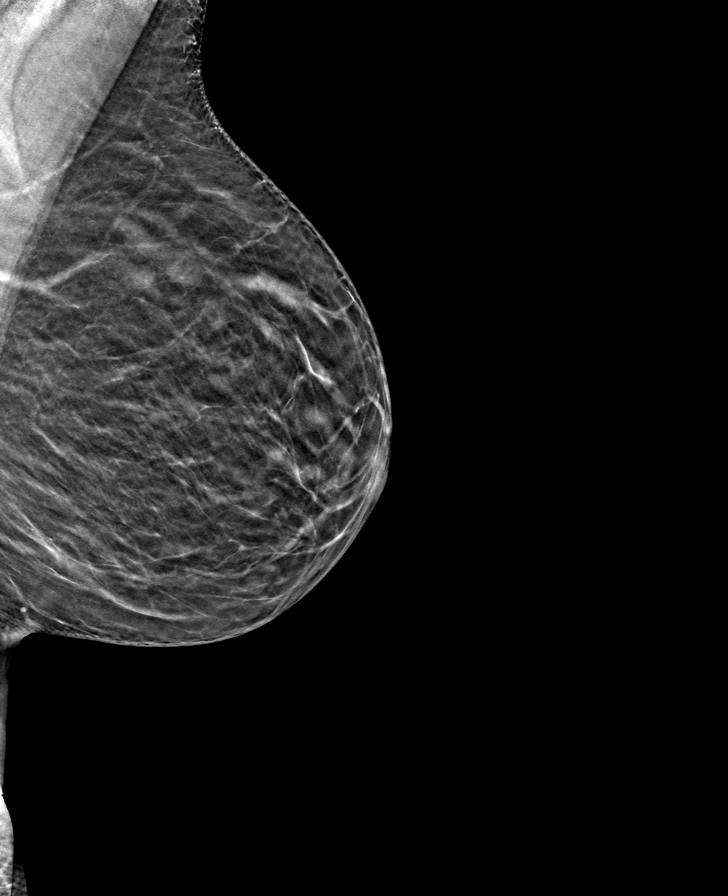

[R CC tomo · tomo slice 22/43.0]
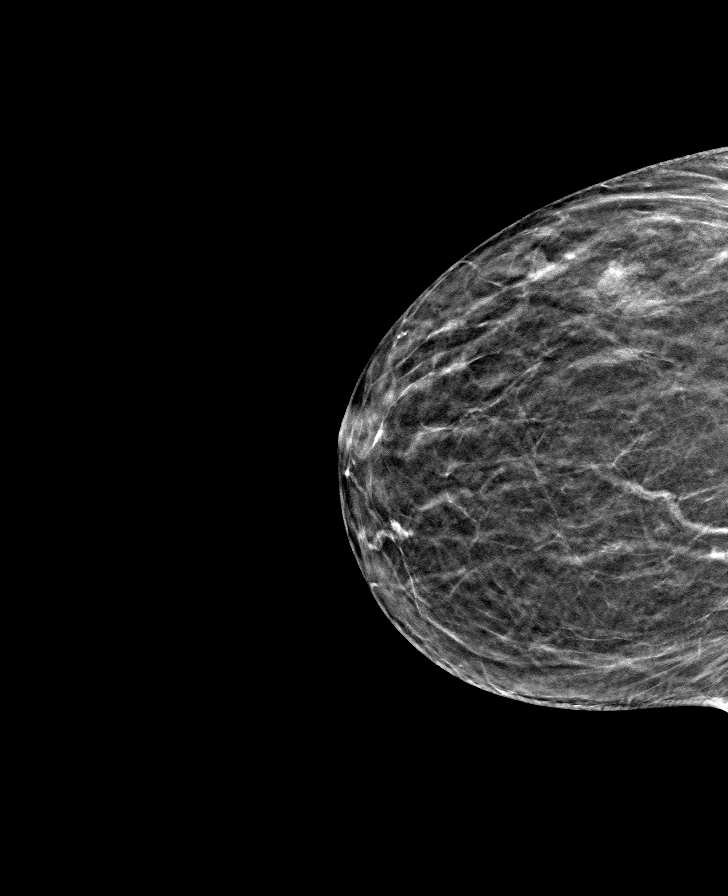

[L CC tomo · tomo slice 19/38.0]
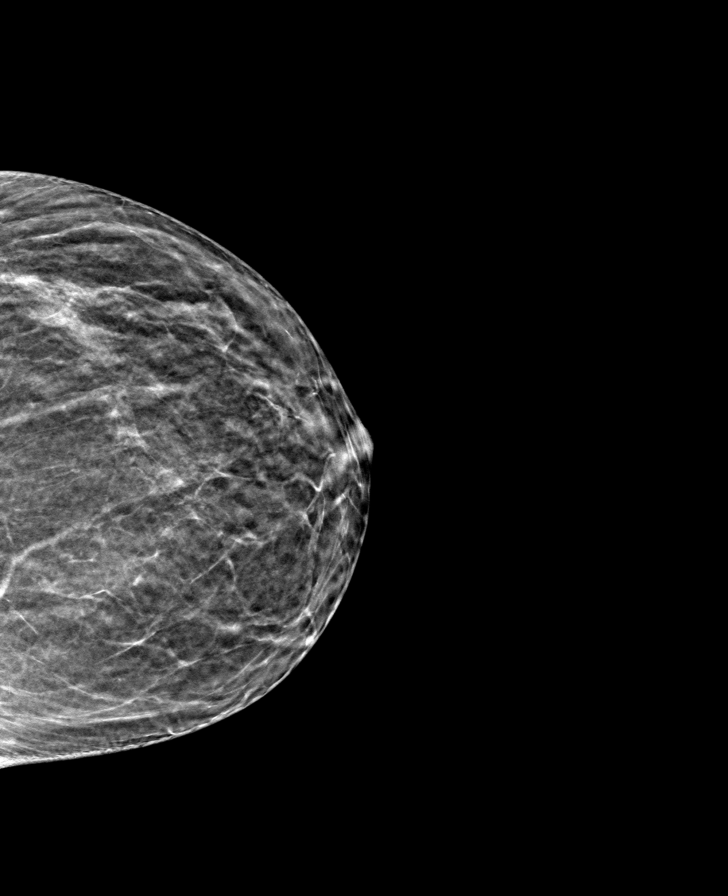

[R MLO tomo · tomo slice 23/44.0]
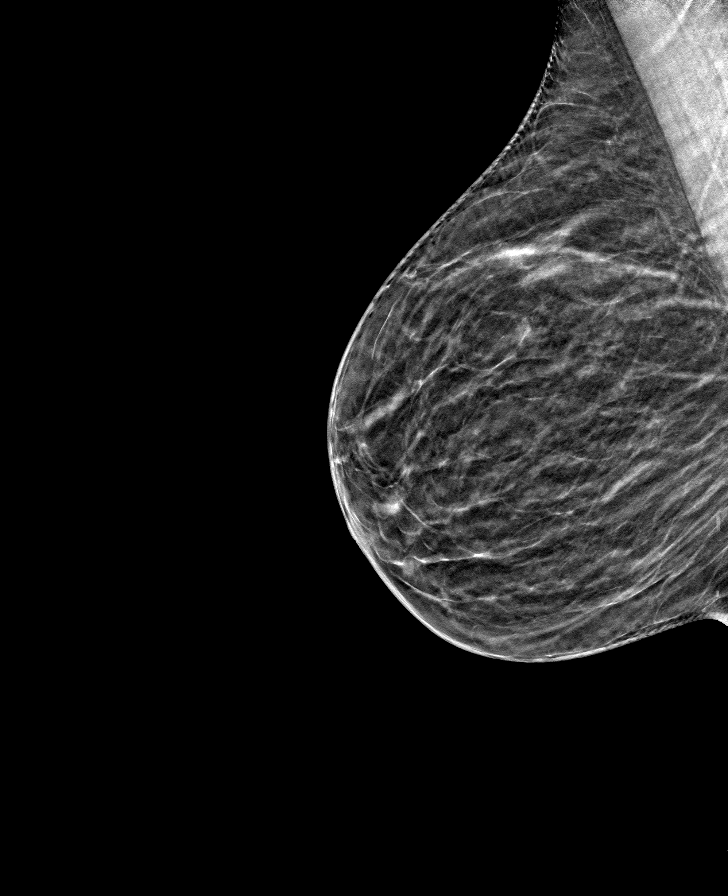

[8 of 24 positions shown; findings below may reference images not displayed]

ACR Breast Density Category b: There are scattered areas of
fibroglandular density.
FINDINGS: There are no findings suspicious for malignancy. Images were
processed with CAD.
IMPRESSION: No mammographic evidence of malignancy. A result letter of this
screening mammogram will be mailed directly to the patient.

RECOMMENDATION:
Screening mammogram in one year. (Code:[TQ])

BI-RADS CATEGORY  1: Negative.

## 2019-01-10 ENCOUNTER — Ambulatory Visit (HOSPITAL_COMMUNITY)
Admission: RE | Admit: 2019-01-10 | Discharge: 2019-01-10 | Disposition: A | Discharge status: Home / Self Care | Payer: BC Managed Care – PPO | Source: Ambulatory Visit | Attending: Internal Medicine | Admitting: Internal Medicine

## 2019-01-10 DIAGNOSIS — G35 Multiple sclerosis: Secondary | ICD-10-CM | POA: Diagnosis present

## 2019-01-10 MED ORDER — DIPHENHYDRAMINE HCL 50 MG/ML IJ SOLN
25.0000 mg | Freq: Once | INTRAMUSCULAR | Status: AC
  Administered 2019-01-10: 25 mg via INTRAVENOUS
  Filled 2019-01-10: qty 1

## 2019-01-10 MED ORDER — METHYLPREDNISOLONE SODIUM SUCC 125 MG IJ SOLR
100.0000 mg | Freq: Once | INTRAMUSCULAR | Status: AC
  Administered 2019-01-10: 09:00:00 100 mg via INTRAVENOUS
  Filled 2019-01-10: qty 2

## 2019-01-10 MED ORDER — SODIUM CHLORIDE 0.9 % IV SOLN
600.0000 mg | Freq: Once | INTRAVENOUS | Status: AC
  Administered 2019-01-10: 10:00:00 600 mg via INTRAVENOUS
  Filled 2019-01-10: qty 20

## 2019-01-10 MED ORDER — ACETAMINOPHEN 325 MG PO TABS
650.0000 mg | ORAL_TABLET | Freq: Once | ORAL | Status: AC
  Administered 2019-01-10: 650 mg via ORAL
  Filled 2019-01-10: qty 2

## 2019-01-10 MED ORDER — SODIUM CHLORIDE 0.9 % IV SOLN
INTRAVENOUS | Status: DC | PRN
  Administered 2019-01-10: 09:00:00 250 mL via INTRAVENOUS

## 2019-01-10 NOTE — Discharge Instructions (Signed)
Ocrelizumab injection What is this medicine? OCRELIZUMAB (ok re LIZ ue mab) treats multiple sclerosis. It helps to decrease the number of multiple sclerosis relapses. It is not a cure. This medicine may be used for other purposes; ask your health care provider or pharmacist if you have questions. COMMON BRAND NAME(S): OCREVUS What should I tell my health care provider before I take this medicine? They need to know if you have any of these conditions:  cancer  hepatitis B infection  other infection (especially a virus infection such as chickenpox, cold sores, or herpes)  an unusual or allergic reaction to ocrelizumab, other medicines, foods, dyes or preservatives  pregnant or trying to get pregnant  breast-feeding How should I use this medicine? This medicine is for infusion into a vein. It is given by a health care professional in a hospital or clinic setting. Talk to your pediatrician regarding the use of this medicine in children. Special care may be needed. Overdosage: If you think you have taken too much of this medicine contact a poison control center or emergency room at once. NOTE: This medicine is only for you. Do not share this medicine with others. What if I miss a dose? Keep appointments for follow-up doses as directed. It is important not to miss your dose. Call your doctor or health care professional if you are unable to keep an appointment. What may interact with this medicine?  alemtuzumab  daclizumab  dimethyl fumarate  fingolimod  glatiramer  interferon beta  live virus vaccines  mitoxantrone  natalizumab  peginterferon beta  rituximab  steroid medicines like prednisone or cortisone  teriflunomide This list may not describe all possible interactions. Give your health care provider a list of all the medicines, herbs, non-prescription drugs, or dietary supplements you use. Also tell them if you smoke, drink alcohol, or use illegal drugs. Some items  may interact with your medicine. What should I watch for while using this medicine? Tell your doctor or healthcare professional if your symptoms do not start to get better or if they get worse. This medicine can cause serious allergic reactions. To reduce your risk you may need to take medicine before treatment with this medicine. Take your medicine as directed. Women should inform their doctor if they wish to become pregnant or think they might be pregnant. There is a potential for serious side effects to an unborn child. Talk to your health care professional or pharmacist for more information. Female patients should use effective birth control methods while receiving this medicine and for 6 months after the last dose. Call your doctor or health care professional for advice if you get a fever, chills or sore throat, or other symptoms of a cold or flu. Do not treat yourself. This drug decreases your body's ability to fight infections. Try to avoid being around people who are sick. If you have a hepatitis B infection or a history of a hepatitis B infection, talk to your doctor. The symptoms of hepatitis B may get worse if you take this medicine. In some patients, this medicine may cause a serious brain infection that may cause death. If you have any problems seeing, thinking, speaking, walking, or standing, tell your doctor right away. If you cannot reach your doctor, urgently seek other source of medical care. This medicine can decrease the response to a vaccine. If you need to get vaccinated, tell your healthcare professional if you have received this medicine. Extra booster doses may be needed. Talk to your  doctor to see if a different vaccination schedule is needed. Talk to your doctor about your risk of cancer. You may be more at risk for certain types of cancers if you take this medicine. What side effects may I notice from receiving this medicine? Side effects that you should report to your doctor  or health care professional as soon as possible:  allergic reactions like skin rash, itching or hives, swelling of the face, lips, or tongue  breathing problems  facial flushing  fast, irregular heartbeat  lump or soreness in the breast  signs and symptoms of herpes such as cold sore, shingles, or genital sores  signs and symptoms of infection like fever or chills, cough, sore throat, pain or trouble passing urine  signs and symptoms of low blood pressure like dizziness; feeling faint or lightheaded, falls; unusually weak or tired  signs and symptoms of progressive multifocal leukoencephalopathy (PML) like changes in vision; clumsiness; confusion; personality changes; weakness on one side of the body  swelling of the ankles, feet, hands Side effects that usually do not require medical attention (report these to your doctor or health care professional if they continue or are bothersome):  back pain  depressed mood  diarrhea  pain, redness, or irritation at site where injected This list may not describe all possible side effects. Call your doctor for medical advice about side effects. You may report side effects to FDA at 1-800-FDA-1088. Where should I keep my medicine? This drug is given in a hospital or clinic and will not be stored at home. NOTE: This sheet is a summary. It may not cover all possible information. If you have questions about this medicine, talk to your doctor, pharmacist, or health care provider.  2020 Elsevier/Gold Standard (2015-04-15 09:40:25)

## 2019-01-16 ENCOUNTER — Other Ambulatory Visit: Payer: Self-pay

## 2019-01-29 ENCOUNTER — Other Ambulatory Visit: Payer: Self-pay

## 2019-01-29 ENCOUNTER — Ambulatory Visit (HOSPITAL_COMMUNITY)
Admission: RE | Admit: 2019-01-29 | Discharge: 2019-01-29 | Disposition: A | Discharge status: Home / Self Care | Payer: BC Managed Care – PPO | Source: Ambulatory Visit | Attending: Internal Medicine | Admitting: Internal Medicine

## 2019-01-29 DIAGNOSIS — G35 Multiple sclerosis: Secondary | ICD-10-CM | POA: Diagnosis present

## 2019-01-29 MED ORDER — DIPHENHYDRAMINE HCL 50 MG/ML IJ SOLN
25.0000 mg | Freq: Once | INTRAMUSCULAR | Status: AC
  Administered 2019-01-29: 25 mg via INTRAVENOUS
  Filled 2019-01-29: qty 1

## 2019-01-29 MED ORDER — METHYLPREDNISOLONE SODIUM SUCC 125 MG IJ SOLR
100.0000 mg | Freq: Once | INTRAMUSCULAR | Status: AC
  Administered 2019-01-29: 100 mg via INTRAVENOUS
  Filled 2019-01-29: qty 2

## 2019-01-29 MED ORDER — SODIUM CHLORIDE 0.9 % IV SOLN
300.0000 mg | Freq: Once | INTRAVENOUS | Status: AC
  Administered 2019-01-29: 300 mg via INTRAVENOUS
  Filled 2019-01-29: qty 10

## 2019-01-29 MED ORDER — ACETAMINOPHEN 325 MG PO TABS
650.0000 mg | ORAL_TABLET | Freq: Once | ORAL | Status: AC
  Administered 2019-01-29: 650 mg via ORAL
  Filled 2019-01-29: qty 2

## 2019-01-29 MED ORDER — SODIUM CHLORIDE 0.9 % IV SOLN
INTRAVENOUS | Status: DC | PRN
  Administered 2019-01-29: 250 mL via INTRAVENOUS

## 2019-01-29 NOTE — Discharge Instructions (Signed)
Ocrelizumab injection °What is this medicine? °OCRELIZUMAB (ok re LIZ ue mab) treats multiple sclerosis. It helps to decrease the number of multiple sclerosis relapses. It is not a cure. °This medicine may be used for other purposes; ask your health care provider or pharmacist if you have questions. °COMMON BRAND NAME(S): OCREVUS °What should I tell my health care provider before I take this medicine? °They need to know if you have any of these conditions: °· cancer °· hepatitis B infection °· other infection (especially a virus infection such as chickenpox, cold sores, or herpes) °· an unusual or allergic reaction to ocrelizumab, other medicines, foods, dyes or preservatives °· pregnant or trying to get pregnant °· breast-feeding °How should I use this medicine? °This medicine is for infusion into a vein. It is given by a health care professional in a hospital or clinic setting. °A special MedGuide will be given to you before each treatment. Be sure to read this information carefully each time. °Talk to your pediatrician regarding the use of this medicine in children. Special care may be needed. °Overdosage: If you think you have taken too much of this medicine contact a poison control center or emergency room at once. °NOTE: This medicine is only for you. Do not share this medicine with others. °What if I miss a dose? °Keep appointments for follow-up doses as directed. It is important not to miss your dose. Call your doctor or health care professional if you are unable to keep an appointment. °What may interact with this medicine? °· alemtuzumab °· daclizumab °· dimethyl fumarate °· fingolimod °· glatiramer °· interferon beta °· live virus vaccines °· mitoxantrone °· natalizumab °· peginterferon beta °· rituximab °· steroid medicines like prednisone or cortisone °· teriflunomide °This list may not describe all possible interactions. Give your health care provider a list of all the medicines, herbs,  non-prescription drugs, or dietary supplements you use. Also tell them if you smoke, drink alcohol, or use illegal drugs. Some items may interact with your medicine. °What should I watch for while using this medicine? °Tell your doctor or healthcare professional if your symptoms do not start to get better or if they get worse. °This medicine can cause serious allergic reactions. To reduce your risk you may need to take medicine before treatment with this medicine. Take your medicine as directed. °Women should inform their doctor if they wish to become pregnant or think they might be pregnant. There is a potential for serious side effects to an unborn child. Talk to your health care professional or pharmacist for more information. Female patients should use effective birth control methods while receiving this medicine and for 6 months after the last dose. °Call your doctor or health care professional for advice if you get a fever, chills or sore throat, or other symptoms of a cold or flu. Do not treat yourself. This drug decreases your body's ability to fight infections. Try to avoid being around people who are sick. °If you have a hepatitis B infection or a history of a hepatitis B infection, talk to your doctor. The symptoms of hepatitis B may get worse if you take this medicine. °In some patients, this medicine may cause a serious brain infection that may cause death. If you have any problems seeing, thinking, speaking, walking, or standing, tell your doctor right away. If you cannot reach your doctor, urgently seek other source of medical care. °This medicine can decrease the response to a vaccine. If you need to get   vaccinated, tell your healthcare professional if you have received this medicine. Extra booster doses may be needed. Talk to your doctor to see if a different vaccination schedule is needed. °Talk to your doctor about your risk of cancer. You may be more at risk for certain types of cancers if you  take this medicine. °What side effects may I notice from receiving this medicine? °Side effects that you should report to your doctor or health care professional as soon as possible: °· allergic reactions like skin rash, itching or hives, swelling of the face, lips, or tongue °· breathing problems °· facial flushing °· fast, irregular heartbeat °· lump or soreness in the breast °· signs and symptoms of herpes such as cold sore, shingles, or genital sores °· signs and symptoms of infection like fever or chills, cough, sore throat, pain or trouble passing urine °· signs and symptoms of low blood pressure like dizziness; feeling faint or lightheaded, falls; unusually weak or tired °· signs and symptoms of progressive multifocal leukoencephalopathy (PML) like changes in vision; clumsiness; confusion; personality changes; weakness on one side of the body °· swelling of the ankles, feet, hands °Side effects that usually do not require medical attention (report these to your doctor or health care professional if they continue or are bothersome): °· back pain °· depressed mood °· diarrhea °· pain, redness, or irritation at site where injected °This list may not describe all possible side effects. Call your doctor for medical advice about side effects. You may report side effects to FDA at 1-800-FDA-1088. °Where should I keep my medicine? °This drug is given in a hospital or clinic and will not be stored at home. °NOTE: This sheet is a summary. It may not cover all possible information. If you have questions about this medicine, talk to your doctor, pharmacist, or health care provider. °© 2020 [employer]vier/Gold Standard (2018-01-02 07:41:53) ° °

## 2019-01-29 NOTE — Progress Notes (Signed)
                   [facility]    Provider: Ladon Applebaum DO   Procedure: De Nurse  Note: Patient received premeds and  Ocrevus via PIV.  Patient tolerated well. Observed for an hour post transfusion. Vitals stable. Discharge instructions given. Alert, oriented and ambulatory with crutch.  Otho Bellows, RN

## 2019-03-08 ENCOUNTER — Encounter: Payer: Self-pay | Admitting: Family

## 2019-03-09 ENCOUNTER — Other Ambulatory Visit: Payer: Self-pay | Admitting: Family

## 2019-03-09 MED ORDER — CIPROFLOXACIN HCL 500 MG PO TABS: 500.0000 mg | ORAL_TABLET | Freq: Two times a day (BID) | ORAL | 0 refills | Status: DC

## 2019-04-06 ENCOUNTER — Other Ambulatory Visit: Payer: Self-pay

## 2019-04-06 ENCOUNTER — Ambulatory Visit: Payer: BC Managed Care – PPO | Admitting: Family

## 2019-04-06 ENCOUNTER — Encounter: Payer: Self-pay | Admitting: Family

## 2019-04-06 VITALS — BP 104/78 | HR 58 | Temp 98.2°F | Ht 64.0 in | Wt 112.6 lb

## 2019-04-06 DIAGNOSIS — N390 Urinary tract infection, site not specified: Secondary | ICD-10-CM

## 2019-04-06 LAB — POC URINALSYSI DIPSTICK (AUTOMATED)
Bilirubin, UA: NEGATIVE
Blood, UA: NEGATIVE
Glucose, UA: NEGATIVE
Ketones, UA: NEGATIVE
Nitrite, UA: NEGATIVE
Protein, UA: POSITIVE — AB
Spec Grav, UA: 1.025 (ref 1.010–1.025)
Urobilinogen, UA: 0.2 E.U./dL
pH, UA: 6 (ref 5.0–8.0)

## 2019-04-06 MED ORDER — NITROFURANTOIN MONOHYD MACRO 100 MG PO CAPS: 100.0000 mg | ORAL_CAPSULE | Freq: Two times a day (BID) | ORAL | 0 refills | Status: DC

## 2019-04-06 NOTE — Progress Notes (Signed)
Jessica Salazar is a 54 y.o. female with the following history as recorded in [employer]:  Patient Active Problem List   Diagnosis Date Noted  . Dysesthesia 06/04/2016  . Urinary frequency 06/04/2016  . Insomnia 06/04/2016  . Greater trochanteric bursitis of right hip 06/10/2015  . Gait abnormality 12/30/2014  . Multiple sclerosis ([place]) 09/25/2010    Current Outpatient Medications  Medication Sig Dispense Refill  . Cholecalciferol (VITAMIN D) 50 MCG (2000 UT) CAPS     . Multiple Vitamins-Minerals (MULTIVITAMIN GUMMIES ADULT PO) Take by mouth. Olly gummy multivitamin 2 po daily    . Ocrelizumab (OCREVUS IV) Inject into the vein.    . ciprofloxacin (CIPRO) 500 MG tablet Take 1 tablet (500 mg total) by mouth 2 (two) times daily. (Patient not taking: Reported on 04/06/2019) 10 tablet 0   No current facility-administered medications for this visit.    Allergies: Patient has no known allergies.  Past Medical History:  Diagnosis Date  . Arthritis   . Constipation    due to MS  . Heart murmur    MVP  . History of recurrent UTIs   . Hypotension   . Mitral valve prolapse   . Multiple sclerosis ([place])   . Neuromuscular disorder ([place])    has /NMS stimulator   . Vision abnormalities     Past Surgical History:  Procedure Laterality Date  . COLONOSCOPY     last > 10 yrs ago in [place] - done for constipation  . MENISCUS REPAIR Right   . right knee replacement Right 2016   right TOtal knee relacement   . UPPER GASTROINTESTINAL ENDOSCOPY      Family History  Problem Relation Age of Onset  . Heart attack Father   . Down syndrome Sister   . Healthy Child   . Colon polyps Neg Hx   . Colon cancer Neg Hx   . Esophageal cancer Neg Hx   . Rectal cancer Neg Hx   . Stomach cancer Neg Hx     Social History   Tobacco Use  . Smoking status: Former Research scientist (life sciences)  . Smokeless tobacco: Never Used  Substance Use Topics  . Alcohol use: Yes    Comment: Rare    Subjective:  Prone to recurrent UTIs  due to complications from MS; keep Rx for Cipro but has only been taking 1-2 days of treatment at at time as opposed to full 5 days; is concerned that symptoms are persisting/ lingering; no fever or blood in urine;   Objective:  Vitals:   04/06/19 0842  BP: 104/78  Pulse: (!) 58  Temp: 98.2 F (36.8 C)  TempSrc: Oral  SpO2: 99%  Weight: 112 lb 9.6 oz (51.1 kg)  Height: 5\' 4"  (1.626 m)    General: Well developed, well nourished, in no acute distress  Skin : Warm and dry.  Head: Normocephalic and atraumatic  Lungs: Respirations unlabored; clear to auscultation bilaterally without wheeze, rales, rhonchi  CVS exam: normal rate and regular rhythm.  Neurologic: Alert and oriented; speech intact; face symmetrical; moves all extremities well; CNII-XII intact without focal deficit   Assessment:  1. Recurrent UTI     Plan:  Check urine culture today; Rx for Macrobid 100 mg bid x 7 days; will also plan to check repeat culture once antibiotics are finished; may need urology evaluation if symptoms persist;  This visit occurred during the SARS-CoV-2 public health emergency.  Safety protocols were in place, including screening questions prior to the visit,  additional usage of staff PPE, and extensive cleaning of exam room while observing appropriate contact time as indicated for disinfecting solutions.      No follow-ups on file.  Orders Placed This Encounter  Procedures  . Urine Culture    Standing Status:   Future    Standing Expiration Date:   04/05/2020    Requested Prescriptions    No prescriptions requested or ordered in this encounter

## 2019-04-07 LAB — URINE CULTURE

## 2019-05-22 NOTE — Progress Notes (Signed)
NEUROLOGY FOLLOW UP OFFICE NOTE  Jessica Salazar [id number]  HISTORY OF PRESENT ILLNESS: Jessica Salazar is a 54 year old right-handed Caucasian woman who follows up for multiple sclerosis.    UPDATE: Current DMT:  Ocrevus (First infusion 12/30 and 1/13); D3 500 or 1000 IU daily  11/21/2018 LABS:  Hep B S Ab non-reactive; vit D 42.68.  12/16/2018 MRI BRAIN W WO (personally reviewed):  1. Patchy T2/FLAIR hyperintensities involving the periventricular, deep, and juxta cortical white matter of both cerebral hemispheres as above, consistent with provided history of chronic multiple sclerosis. Overall, appearance is similar as compared to previous exam from 2018, without evidence for significant disease progression. No evidence for active demyelination. 2. No other acute intracranial abnormality identified.  12/16/2018 MRI CERVICAL SPINE W WO:  1. Extensive patchy signal abnormality throughout the cervical spinal cord as detailed above, compatible with history of chronic multiple sclerosis. Overall, appearance is similar as compared to previous exam from 2018, without evidence for significant disease progression. No evidence for active demyelination.  2. Left foraminal disc osteophyte complex and facet degeneration at C3-4 with resultant moderate left C4 foraminal stenosis.  3. Additional multifactorial degenerative changes with resultant mild to moderate bilateral C5 through C7 foraminal narrowing as above.  No adverse reaction to Ocrevus.  No significant changes but is able to use her right hand better, including writing.  Started PT for upper extremities and leg weakness and balance, which has been helpful.  Vision:  Left worse than right blurred vision. Motor:  Right leg weakness secondary to MS but also due to history of right leg fractures.  Some mild right hand weakness.  Heaviness bilaterally from below chest down. Sensory:  Paresthesias of left hand and from below chest down to and  including both feet. Pain:  Chronic pain in right leg secondary to prior trauma/fractures and hyperextension/pain of right knee pain. Gait:  Unsteady Bowel/Bladder:  Increased urinary frequency and urgency.  Needs to get up and urinate up to 4 times during the night.  Has a recent UTI.  Constipation. Fatigue:  Mild fatigue.  Able to function.   Cognition:  Mild word-finding issues Mood:  Denies depression or anxiety. She gets nauseous from time to time.  Uses promethazine.  HISTORY: She was diagnosed with multiple sclerosis in 1993 after presenting with painful burning and tingling sensation in the upper back and chest as well as difficulty with balance.  Diagnosis was concerned via MRI.  She did not undergo lumbar puncture.  She had recurrent episodes of optic neuritis in the mid 2000s requiring treatment with Solu-Medrol.  Other exacerbations include paresthesias and increased difficulty with balance and typically right sided weakness.  She last saw a neurologist in 2018, Dr. Arlice Colt.  At that time, she was started on Aubagio but caused hair loss and didn't think she was getting relief so she stopped.  She initially was started on Avonex in late-90s or early 2000s.  She started Rebiff around 2003 but had decreased appetite and stopped after 2 years.  She was on Aubagio in 2018 for 2 months.  She does not like the idea of taking DMT.  In September 2020, she had a flare up of right sided weakness.  She received Solu-Medrol and symptoms dissipated 3 to 4 weeks but still wears an ankle brace because her foot turns in.  Her previous significant exacerbation occurred 2 to 3 years ago.   Past DMT:  Rebif (did not like the way  it made her feel); Avonex (did not like the way it made her feel); Aubagio (hair loss)  Imaging: 06/20/2016 (personally reviewed) MRI BRAIN WO W:  Multiple T2/FLAIR hyperintense foci in the cerebral hemispheres, primarily involving the periventricular and  juxtacortical white matter.  No involvement of cerebellum and brainstem.  Normal enhancement pattern (reportedly stable compared to prior imaging in 2011). 06/20/2016 (personally reviewed) MRI CERVICAL WO W:  Hyperintense foci within spinal cord at C4-C5, C5, C6, C7 and possibly C3-C4 levels consistent with chronic demyelinating plaques (reportedly stable compared to prior imaging in 2011).  Labs: 03/06/2018:  Vitamin D 38.11; B12 397; TSH 1.14 06/05/2018:  Quantiferon TB Gold negative  Unknown family history of MS.  Her father passed away when she was a child and she is not familiar with her mother's side of family.  PAST MEDICAL HISTORY: Past Medical History:  Diagnosis Date  . Arthritis   . Constipation    due to MS  . Heart murmur    MVP  . History of recurrent UTIs   . Hypotension   . Mitral valve prolapse   . Multiple sclerosis ([place])   . Neuromuscular disorder ([place])    has /NMS stimulator   . Vision abnormalities     MEDICATIONS: Current Outpatient Medications on File Prior to Visit  Medication Sig Dispense Refill  . Cholecalciferol (VITAMIN D) 50 MCG (2000 UT) CAPS     . ciprofloxacin (CIPRO) 500 MG tablet Take 1 tablet (500 mg total) by mouth 2 (two) times daily. (Patient not taking: Reported on 04/06/2019) 10 tablet 0  . Multiple Vitamins-Minerals (MULTIVITAMIN GUMMIES ADULT PO) Take by mouth. Olly gummy multivitamin 2 po daily    . nitrofurantoin, macrocrystal-monohydrate, (MACROBID) 100 MG capsule Take 1 capsule (100 mg total) by mouth 2 (two) times daily. 14 capsule 0  . Ocrelizumab (OCREVUS IV) Inject into the vein.     No current facility-administered medications on file prior to visit.    ALLERGIES: No Known Allergies  FAMILY HISTORY: Family History  Problem Relation Age of Onset  . Heart attack Father   . Down syndrome Sister   . Healthy Child   . Colon polyps Neg Hx   . Colon cancer Neg Hx   . Esophageal cancer Neg Hx   . Rectal cancer Neg Hx   .  Stomach cancer Neg Hx     SOCIAL HISTORY: Social History   Socioeconomic History  . Marital status: Single    Spouse name: Not on file  . Number of children: 1  . Years of education: Not on file  . Highest education level: Some college, no degree  Occupational History  . Not on file  Tobacco Use  . Smoking status: Former Research scientist (life sciences)  . Smokeless tobacco: Never Used  Substance and Sexual Activity  . Alcohol use: Yes    Comment: Rare  . Drug use: No  . Sexual activity: Not on file  Other Topics Concern  . Not on file  Social History Narrative   Pt is single with boyfriends she has 1 child right handed   Lives in 1 story home   Drinks coffee daily, no tea rarely drinks soda   Social Determinants of Health   Financial Resource Strain:   . Difficulty of Paying Living Expenses:   Food Insecurity:   . Worried About Charity fundraiser in the Last Year:   . Arboriculturist in the Last Year:   Transportation Needs:   .  Lack of Transportation (Medical):   Marland Kitchen Lack of Transportation (Non-Medical):   Physical Activity:   . Days of Exercise per Week:   . Minutes of Exercise per Session:   Stress:   . Feeling of Stress :   Social Connections:   . Frequency of Communication with Friends and Family:   . Frequency of Social Gatherings with Friends and Family:   . Attends Religious Services:   . Active Member of Clubs or Organizations:   . Attends Archivist Meetings:   Marland Kitchen Marital Status:   Intimate Partner Violence:   . Fear of Current or Ex-Partner:   . Emotionally Abused:   Marland Kitchen Physically Abused:   . Sexually Abused:     PHYSICAL EXAM: Blood pressure 126/77, pulse 73, resp. rate 18, height 5\' 4"  (1.626 m), weight 112 lb (50.8 kg), SpO2 99 %. General: No acute distress.  Patient appears well-groomed.   Head:  Normocephalic/atraumatic Eyes:  Fundi examined but not visualized Neck: supple, no paraspinal tenderness, full range of motion Heart:  Regular rate and  rhythm Lungs:  Clear to auscultation bilaterally Back: No paraspinal tenderness Neurological Exam: alert and oriented to person, place, and time. Attention span and concentration intact, recent and remote memory intact, fund of knowledge intact.  Speech fluent and not dysarthric, language intact.  Right upper quadrant vision loss in left eye.  Otherwise, CN II-XII intact. Bulk and tone normal, Muscle strength 3-/5 right hip flexion, otherwise 4/5 right lower extremity, otherwise 5-/5 throughout.  Pinprick sensation reduced up to above knees bilaterally and right upper extremity.  Reduced vibratory sensation in feet up to knees.  Deep tendon reflexes 3+ throughout (right slightly greater than left); bilateral Babinski.  and heel to shin testing intact.  Wide based, right hemiplegic gait.  Timed 25 foot walk 14.92 seconds.  Romberg with sway.  IMPRESSION: Multiple sclerosis.  PLAN: 1.  Next Ocrevus infusion in July 2.  D3 1000 IU daily 3.  Repeat vitamin D level in 6 months 4.  Repeat MRI of brain and cervical spine with and without contrast in 6 months. 5.  Follow up in 6 months (after repeat testing)  Metta Clines, DO  CC: Marrian Salvage, FNP

## 2019-05-24 ENCOUNTER — Encounter: Payer: Self-pay | Admitting: Neurology

## 2019-05-24 ENCOUNTER — Other Ambulatory Visit: Payer: Self-pay

## 2019-05-24 ENCOUNTER — Ambulatory Visit (INDEPENDENT_AMBULATORY_CARE_PROVIDER_SITE_OTHER): Payer: BC Managed Care – PPO | Admitting: Neurology

## 2019-05-24 VITALS — BP 126/77 | HR 73 | Resp 18 | Ht 64.0 in | Wt 112.0 lb

## 2019-05-24 DIAGNOSIS — G35 Multiple sclerosis: Secondary | ICD-10-CM | POA: Diagnosis not present

## 2019-05-24 NOTE — Patient Instructions (Signed)
1.  Take D3 1000 IU daily 2.  Follow up for next Ocrevus 3.  Repeat vitamin D level in 6 months 4.  Repeat MRI of brain and cervical spine with and without contrast in 6 months 5.  Follow up in 6 months (after lab and MRI)

## 2019-06-06 ENCOUNTER — Other Ambulatory Visit: Payer: Self-pay

## 2019-06-08 NOTE — Progress Notes (Signed)
Case# [id number] Ocervus Pa started over the phone.  Site [facility] waiting on response.

## 2019-06-12 ENCOUNTER — Telehealth: Payer: Self-pay | Admitting: Neurology

## 2019-06-12 NOTE — Telephone Encounter (Signed)
Apolonio Schneiders from [facility] left a voicemail regarding patient. Needing more information and to fax clinicals to [phone].

## 2019-06-14 NOTE — Telephone Encounter (Signed)
Lattie Haw from [place] left a message with [employer] trying to reach [facility] regarding patient.

## 2019-06-14 NOTE — Telephone Encounter (Signed)
Advised pt we can try [facility] they have a center as well as they do inhome infusions.   Pt would like to try [facility].

## 2019-06-14 NOTE — Telephone Encounter (Signed)
Returned [employer] call, Pt needs to be seen in a In-patient setting or Do Home infusions for her insurance to cover..   Telephone call to pt, Pt state she refuses to have home infusions. She do not feel comfortable having someone she do not know in her home with her all day.  Advised pt that she can be referred to [facility] if she would like to. They have an infusion center within the Office.   Pt states she will call her insurance back and find out why she was told something different. And give [place] a call back to let us know what she decides.

## 2019-06-14 NOTE — Telephone Encounter (Signed)
She can only receive infusions at [facility] if she is seen by Dr. Felecia Shelling for office visit.

## 2019-06-15 NOTE — Progress Notes (Signed)
Per Message left by Linton Flemings, Pt unable to have infusion done at a outpatient setting. Pt needs to have this done at center or in home setting.    Will try and find another place for pt to have infusion done.    Paerwork filled and records faxed to [facility].

## 2019-06-15 NOTE — Progress Notes (Signed)
Letter received Approval from [facility] 06/08/19-06/07/20

## 2019-06-15 NOTE — Progress Notes (Signed)
Pt notes and labs faxed over to [facility] infusions. Pt can do in home or In patient infusions. Company will call pt.    Tried calling Apolonio Schneiders to give NPI For [facility] no answer. NPI# [id number]

## 2019-06-19 ENCOUNTER — Telehealth: Payer: Self-pay

## 2019-06-19 ENCOUNTER — Other Ambulatory Visit: Payer: Self-pay

## 2019-06-19 DIAGNOSIS — G35 Multiple sclerosis: Secondary | ICD-10-CM

## 2019-06-19 NOTE — Telephone Encounter (Signed)
LMOVM for pt. Orders added pt has be her 06/20/19 by 12pm or not again until 06/26/19

## 2019-06-19 NOTE — Telephone Encounter (Signed)
OK 

## 2019-06-19 NOTE — Progress Notes (Signed)
LMOVM for pt 

## 2019-06-19 NOTE — Telephone Encounter (Signed)
Telephone call from [facility]. Pr Rep pt needs labs drawn for Hep B Surface Antigen, Core antibodies, IGG/IGA,IGM

## 2019-06-20 ENCOUNTER — Telehealth: Payer: Self-pay

## 2019-06-20 NOTE — Telephone Encounter (Signed)
Telephone call to pt, Advised pt of labs needed for Transfusion with [facility].   Pt states she will try and make it 06/26/19. Pt unable to make it this week.

## 2019-06-26 ENCOUNTER — Other Ambulatory Visit (INDEPENDENT_AMBULATORY_CARE_PROVIDER_SITE_OTHER): Payer: BC Managed Care – PPO

## 2019-06-26 ENCOUNTER — Other Ambulatory Visit: Payer: Self-pay

## 2019-06-26 DIAGNOSIS — G35 Multiple sclerosis: Secondary | ICD-10-CM

## 2019-06-27 LAB — HEPATITIS B SURFACE ANTIGEN: Hepatitis B Surface Ag: NEGATIVE

## 2019-06-27 LAB — HEPATITIS B CORE ANTIBODY, TOTAL: Hep B Core Total Ab: NEGATIVE

## 2019-06-27 LAB — HEPATITIS B CORE ANTIBODY, IGM: Hep B C IgM: NEGATIVE

## 2019-06-27 NOTE — Progress Notes (Signed)
Pt informed of her test results.

## 2019-07-02 ENCOUNTER — Telehealth: Payer: Self-pay

## 2019-07-02 DIAGNOSIS — G35 Multiple sclerosis: Secondary | ICD-10-CM

## 2019-07-02 NOTE — Telephone Encounter (Signed)
Telephone call from [facility],  Per Rep they need a Clearance letter stating pt is okay to start Ocrevus without Immunoglobulins (IgA, IgG, IgM). Test preformed on 06/26/19 for IGM, Hep B panel.   Kieth Brightly in the lab will see fif we could add this to her lab work, If not will ask Dr. Tomi Likens to type a letter for the pt.

## 2019-07-02 NOTE — Telephone Encounter (Signed)
Per [place] in the lab. Spoke to [employer] [facility] they still have the pt blood.They will run the [place]. On the sample they have.  Per Kieth Brightly she will keep an eye on it as well as I will check pt chart in the next couple of days.

## 2019-07-02 NOTE — Telephone Encounter (Signed)
If IgA, IgG, IgM is now required, then we will need to have it checked.

## 2019-07-03 LAB — SPECIMEN STATUS REPORT

## 2019-07-03 LAB — IGG, IGA, IGM
IgA/Immunoglobulin A, Serum: 185 mg/dL (ref 87–352)
IgG (Immunoglobin G), Serum: 922 mg/dL (ref 586–1602)
IgM (Immunoglobulin M), Srm: 79 mg/dL (ref 26–217)

## 2019-07-03 NOTE — Progress Notes (Signed)
All lab results faxed over to [facility].  Hep b Immuanogoblin's

## 2019-07-03 NOTE — Progress Notes (Signed)
Pt advised of lab results. Lab work sent to [facility] so pt can start her infusions.

## 2019-07-09 ENCOUNTER — Ambulatory Visit: Payer: BC Managed Care – PPO | Admitting: Family

## 2019-07-13 ENCOUNTER — Ambulatory Visit: Payer: BC Managed Care – PPO | Admitting: Family

## 2019-07-13 ENCOUNTER — Other Ambulatory Visit: Payer: Self-pay

## 2019-07-13 ENCOUNTER — Encounter: Payer: Self-pay | Admitting: Family

## 2019-07-13 VITALS — BP 130/80 | HR 61 | Temp 98.1°F | Ht 64.0 in | Wt 111.2 lb

## 2019-07-13 DIAGNOSIS — R300 Dysuria: Secondary | ICD-10-CM

## 2019-07-13 DIAGNOSIS — G35 Multiple sclerosis: Secondary | ICD-10-CM | POA: Diagnosis not present

## 2019-07-13 DIAGNOSIS — R3129 Other microscopic hematuria: Secondary | ICD-10-CM | POA: Diagnosis not present

## 2019-07-13 LAB — POC URINALSYSI DIPSTICK (AUTOMATED)
Bilirubin, UA: NEGATIVE
Glucose, UA: NEGATIVE
Ketones, UA: NEGATIVE
Leukocytes, UA: NEGATIVE
Nitrite, UA: NEGATIVE
Protein, UA: NEGATIVE
Spec Grav, UA: 1.025 (ref 1.010–1.025)
Urobilinogen, UA: 0.2 E.U./dL
pH, UA: 6 (ref 5.0–8.0)

## 2019-07-13 MED ORDER — CIPROFLOXACIN HCL 500 MG PO TABS: 500.0000 mg | ORAL_TABLET | Freq: Two times a day (BID) | ORAL | 1 refills | Status: DC

## 2019-07-13 NOTE — Progress Notes (Signed)
Jessica Salazar is a 54 y.o. female with the following history as recorded in EpicCare:  Patient Active Problem List   Diagnosis Date Noted  . Dysesthesia 06/04/2016  . Urinary frequency 06/04/2016  . Insomnia 06/04/2016  . Greater trochanteric bursitis of right hip 06/10/2015  . Gait abnormality 12/30/2014  . Multiple sclerosis ([place]) 09/25/2010    Current Outpatient Medications  Medication Sig Dispense Refill  . Cholecalciferol (VITAMIN D) 50 MCG (2000 UT) CAPS     . Multiple Vitamins-Minerals (MULTIVITAMIN GUMMIES ADULT PO) Take by mouth. Olly gummy multivitamin 2 po daily    . Ocrelizumab (OCREVUS IV) Inject into the vein.    . ciprofloxacin (CIPRO) 500 MG tablet Take 1 tablet (500 mg total) by mouth 2 (two) times daily. 10 tablet 1   No current facility-administered medications for this visit.    Allergies: Patient has no known allergies.  Past Medical History:  Diagnosis Date  . Arthritis   . Constipation    due to MS  . Heart murmur    MVP  . History of recurrent UTIs   . Hypotension   . Mitral valve prolapse   . Multiple sclerosis ([place])   . Neuromuscular disorder ([place])    has /NMS stimulator   . Vision abnormalities     Past Surgical History:  Procedure Laterality Date  . COLONOSCOPY     last > 10 yrs ago in [place] - done for constipation  . MENISCUS REPAIR Right   . right knee replacement Right 2016   right TOtal knee relacement   . UPPER GASTROINTESTINAL ENDOSCOPY      Family History  Problem Relation Age of Onset  . Heart attack Father   . Down syndrome Sister   . Healthy Child   . Colon polyps Neg Hx   . Colon cancer Neg Hx   . Esophageal cancer Neg Hx   . Rectal cancer Neg Hx   . Stomach cancer Neg Hx     Social History   Tobacco Use  . Smoking status: Former Research scientist (life sciences)  . Smokeless tobacco: Never Used  Substance Use Topics  . Alcohol use: Yes    Comment: Rare    Subjective:   Patient presents with concerns for urinary tract infection; prone  to recurrent infections; + strong odor; no fever or back pain; has done well on Cipro in the past and is requesting to use again;    Objective:  Vitals:   07/13/19 0859  BP: 130/80  Pulse: 61  Temp: 98.1 F (36.7 C)  TempSrc: Oral  SpO2: 99%  Weight: 111 lb 4 oz (50.5 kg)  Height: 5\' 4"  (1.626 m)    General: Well developed, well nourished, in no acute distress  Skin : Warm and dry.  Head: Normocephalic and atraumatic  Lungs: Respirations unlabored; clear to auscultation bilaterally without wheeze, rales, rhonchi  Vessels: Symmetric bilaterally  Neurologic: Alert and oriented; speech intact; face symmetrical; moves all extremities well; CNII-XII intact without focal deficit   Assessment:  1. Dysuria   2. Other microscopic hematuria   3. Multiple sclerosis ([place])     Plan:  Check U/A and urine culture; Rx for Cipro 500 mg bid x 5 days- has responded well in the past; follow-up to be determined.   This visit occurred during the SARS-CoV-2 public health emergency.  Safety protocols were in place, including screening questions prior to the visit, additional usage of staff PPE, and extensive cleaning of exam room while observing appropriate  contact time as indicated for disinfecting solutions.     No follow-ups on file.  Orders Placed This Encounter  Procedures  . Urine Culture  . POCT Urinalysis Dipstick (Automated)    Requested Prescriptions   Signed Prescriptions Disp Refills  . ciprofloxacin (CIPRO) 500 MG tablet 10 tablet 1    Sig: Take 1 tablet (500 mg total) by mouth 2 (two) times daily.

## 2019-07-15 LAB — URINE CULTURE

## 2019-07-17 ENCOUNTER — Other Ambulatory Visit: Payer: Self-pay | Admitting: Family

## 2019-07-17 MED ORDER — SULFAMETHOXAZOLE-TRIMETHOPRIM 800-160 MG PO TABS: 1.0000 | ORAL_TABLET | Freq: Two times a day (BID) | ORAL | 0 refills | Status: DC

## 2019-07-17 NOTE — Progress Notes (Signed)
Reviewed with patient; she will d/c Cipro and will change to Bactrim bid x 5 days; she will follow-up if symptoms persist.

## 2019-07-26 ENCOUNTER — Other Ambulatory Visit: Payer: Self-pay | Admitting: Neurology

## 2019-07-26 MED ORDER — PROMETHAZINE HCL 25 MG PO TABS: 25.0000 mg | ORAL_TABLET | Freq: Four times a day (QID) | ORAL | 0 refills | Status: DC | PRN

## 2019-07-26 NOTE — Addendum Note (Signed)
Addended by: Ladell Pier A on: 07/26/2019 02:19 PM   Modules accepted: Orders

## 2019-09-11 ENCOUNTER — Other Ambulatory Visit: Payer: Self-pay

## 2019-09-11 ENCOUNTER — Ambulatory Visit: Payer: BC Managed Care – PPO | Admitting: Family

## 2019-09-11 ENCOUNTER — Encounter: Payer: Self-pay | Admitting: Family

## 2019-09-11 VITALS — BP 114/78 | HR 61 | Temp 98.3°F | Ht 64.0 in | Wt 107.0 lb

## 2019-09-11 DIAGNOSIS — N390 Urinary tract infection, site not specified: Secondary | ICD-10-CM

## 2019-09-11 DIAGNOSIS — R300 Dysuria: Secondary | ICD-10-CM | POA: Diagnosis not present

## 2019-09-11 LAB — POC URINALSYSI DIPSTICK (AUTOMATED)
Bilirubin, UA: NEGATIVE
Blood, UA: NEGATIVE
Glucose, UA: NEGATIVE
Ketones, UA: NEGATIVE
Leukocytes, UA: NEGATIVE
Nitrite, UA: NEGATIVE
Protein, UA: NEGATIVE
Spec Grav, UA: 1.02 (ref 1.010–1.025)
Urobilinogen, UA: 0.2 E.U./dL
pH, UA: 6.5 (ref 5.0–8.0)

## 2019-09-11 MED ORDER — NITROFURANTOIN MONOHYD MACRO 100 MG PO CAPS: 100.0000 mg | ORAL_CAPSULE | Freq: Two times a day (BID) | ORAL | 0 refills | Status: DC

## 2019-09-11 NOTE — Progress Notes (Signed)
Jessica Salazar is a 54 y.o. female with the following history as recorded in EpicCare:  Patient Active Problem List   Diagnosis Date Noted  . Dysesthesia 06/04/2016  . Urinary frequency 06/04/2016  . Insomnia 06/04/2016  . Greater trochanteric bursitis of right hip 06/10/2015  . Gait abnormality 12/30/2014  . Multiple sclerosis ([place]) 09/25/2010    Current Outpatient Medications  Medication Sig Dispense Refill  . Cholecalciferol (VITAMIN D) 50 MCG (2000 UT) CAPS     . Multiple Vitamins-Minerals (MULTIVITAMIN GUMMIES ADULT PO) Take by mouth. Olly gummy multivitamin 2 po daily    . Ocrelizumab (OCREVUS IV) Inject into the vein.    . promethazine (PHENERGAN) 25 MG tablet Take 1 tablet (25 mg total) by mouth every 6 (six) hours as needed for nausea or vomiting. 30 tablet 0  . nitrofurantoin, macrocrystal-monohydrate, (MACROBID) 100 MG capsule Take 1 capsule (100 mg total) by mouth 2 (two) times daily. 14 capsule 0   No current facility-administered medications for this visit.    Allergies: Patient has no known allergies.  Past Medical History:  Diagnosis Date  . Arthritis   . Constipation    due to MS  . Heart murmur    MVP  . History of recurrent UTIs   . Hypotension   . Mitral valve prolapse   . Multiple sclerosis ([place])   . Neuromuscular disorder ([place])    has /NMS stimulator   . Vision abnormalities     Past Surgical History:  Procedure Laterality Date  . COLONOSCOPY     last > 10 yrs ago in [place] - done for constipation  . MENISCUS REPAIR Right   . right knee replacement Right 2016   right TOtal knee relacement   . UPPER GASTROINTESTINAL ENDOSCOPY      Family History  Problem Relation Age of Onset  . Heart attack Father   . Down syndrome Sister   . Healthy Child   . Colon polyps Neg Hx   . Colon cancer Neg Hx   . Esophageal cancer Neg Hx   . Rectal cancer Neg Hx   . Stomach cancer Neg Hx     Social History   Tobacco Use  . Smoking status: Former Research scientist (life sciences)  .  Smokeless tobacco: Never Used  Substance Use Topics  . Alcohol use: Yes    Comment: Rare    Subjective:  Concern for possible recurrent UTI; was treated with 5 days of Bactrim in July 2021 and did feel that infection cleared completely; however, symptoms have re-flared in the past 2 weeks;       Objective:  Vitals:   09/11/19 1226  BP: 114/78  Pulse: 61  Temp: 98.3 F (36.8 C)  TempSrc: Oral  SpO2: 95%  Weight: 107 lb (48.5 kg)  Height: 5\' 4"  (1.626 m)    General: Well developed, well nourished, in no acute distress  Head: Normocephalic and atraumatic  Lungs: Respirations unlabored; clear to auscultation bilaterally without wheeze, rales, rhonchi  Vessels: Symmetric bilaterally  Neurologic: Alert and oriented; speech intact; face symmetrical;    Assessment:  1. Dysuria   2. Recurrent UTI     Plan:  Check urine culture today; Rx for Macrobid 100 mg bid x 7 days ( choice based on last urine culture); plan to repeat urine culture to ensure resolution once completes antibiotics;  This visit occurred during the SARS-CoV-2 public health emergency.  Safety protocols were in place, including screening questions prior to the visit, additional usage of  staff PPE, and extensive cleaning of exam room while observing appropriate contact time as indicated for disinfecting solutions.     No follow-ups on file.  Orders Placed This Encounter  Procedures  . Urine Culture    Standing Status:   Future    Number of Occurrences:   1    Standing Expiration Date:   09/10/2020  . POCT Urinalysis Dipstick (Automated)    Requested Prescriptions   Signed Prescriptions Disp Refills  . nitrofurantoin, macrocrystal-monohydrate, (MACROBID) 100 MG capsule 14 capsule 0    Sig: Take 1 capsule (100 mg total) by mouth 2 (two) times daily.

## 2019-09-11 NOTE — Progress Notes (Signed)
Error - duplicate note.

## 2019-09-11 NOTE — Patient Instructions (Signed)
Please plan to drop off a urine sample at [facility] between 7:30-5:30 pm; you don't need an appointment; Plan to do that next Thursday/ Friday so we can be sure the infection has cleared.

## 2019-09-12 LAB — URINE CULTURE

## 2019-11-13 ENCOUNTER — Other Ambulatory Visit: Payer: Self-pay | Admitting: Neurology

## 2019-11-13 MED ORDER — DIAZEPAM 5 MG PO TABS: ORAL_TABLET | ORAL | 0 refills | Status: DC

## 2019-11-13 NOTE — Progress Notes (Unsigned)
valium 

## 2019-11-17 ENCOUNTER — Ambulatory Visit
Admission: RE | Admit: 2019-11-17 | Discharge: 2019-11-17 | Disposition: A | Discharge status: Home / Self Care | Payer: BC Managed Care – PPO | Source: Ambulatory Visit | Attending: Neurology | Admitting: Neurology

## 2019-11-17 DIAGNOSIS — G35 Multiple sclerosis: Secondary | ICD-10-CM

## 2019-11-17 IMAGING — MR MR CERVICAL SPINE WO/W CM
5 of 8 series · 26 of 48 positions shown · IV contrast (multihance)
Comparison: MRI cervical spine [DATE]

CLINICAL DATA: Multiple sclerosis

EXAM:
MRI CERVICAL SPINE WITHOUT AND WITH CONTRAST
TECHNIQUE: Multiplanar and multiecho pulse sequences of the cervical spine, to
include the craniocervical junction and cervicothoracic junction,
were obtained without and with intravenous contrast.
CONTRAST:  9mL MULTIHANCE GADOBENATE DIMEGLUMINE 529 MG/ML IV SOLN

[Series 3: T1 · sagittal · 3.0mm · 0.41mm/px · 4 of 15 slices shown (1 of 2)]
[im 1/15]
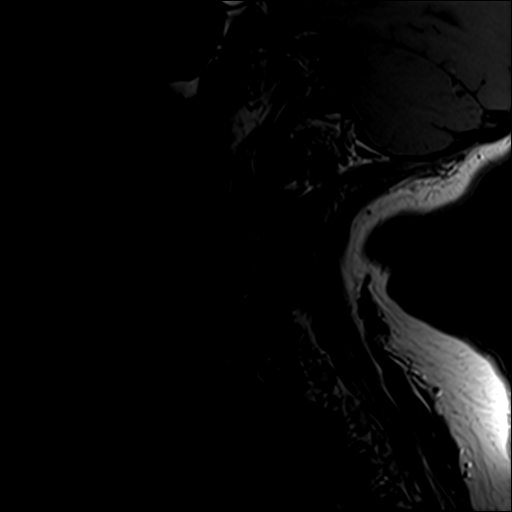
[im 5/15]
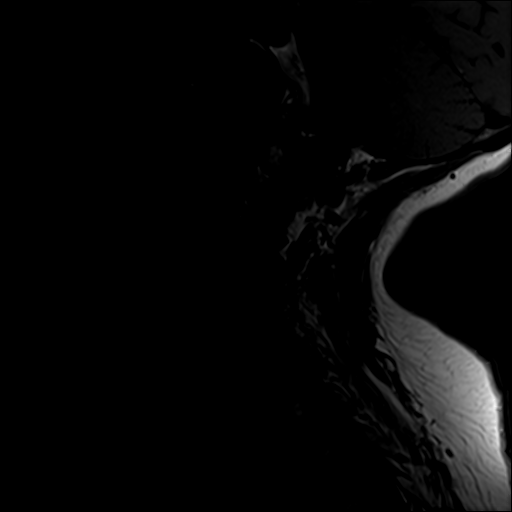
[im 10/15]
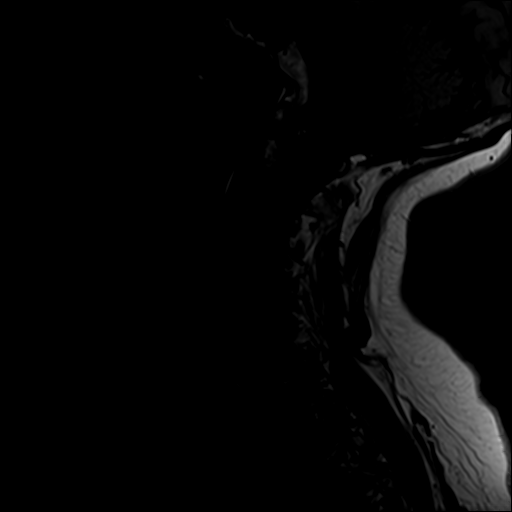
[im 15/15]
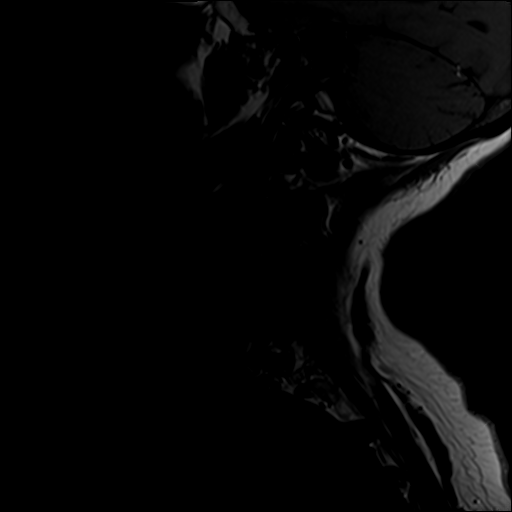

[Series 5: T2 · axial · 3.0mm · 0.70mm/px · z∈[-192,-94]mm · 8 of 28 slices shown (1 of 2)]
[im 1/28]
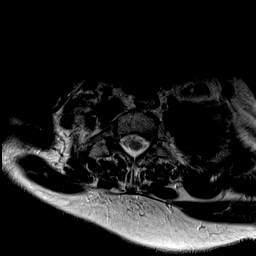
[im 4/28]
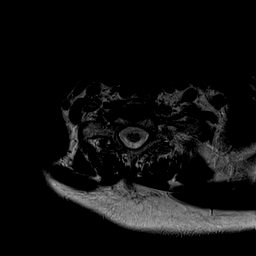
[im 8/28]
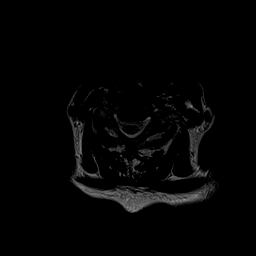
[im 12/28]
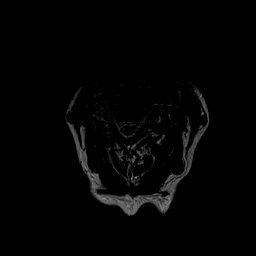
[im 16/28]
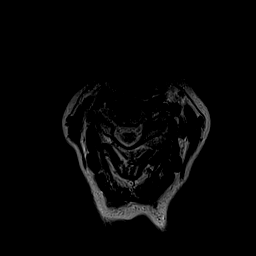
[im 20/28]
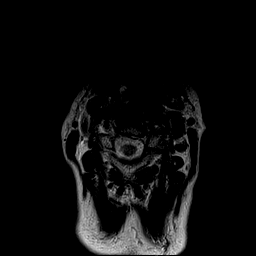
[im 24/28]
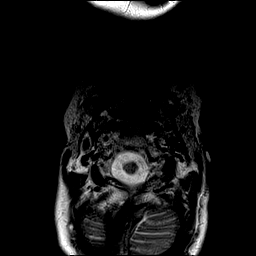
[im 28/28]
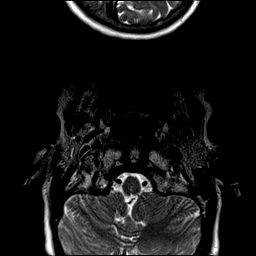

[Series 6: T1 · axial · 3.0mm · 0.35mm/px · z∈[-192,-94]mm · 8 of 28 slices shown (2 of 2)]
[im 1/28]
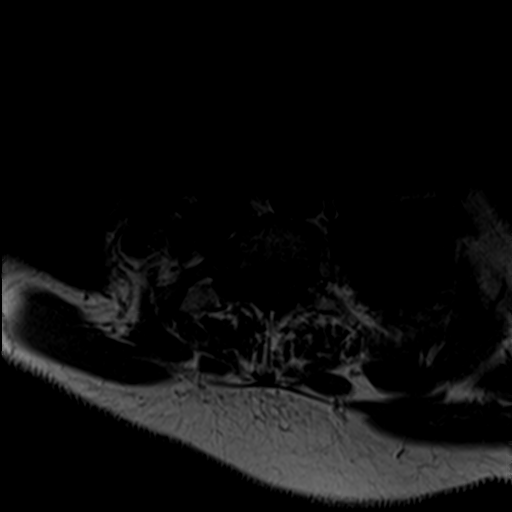
[im 4/28]
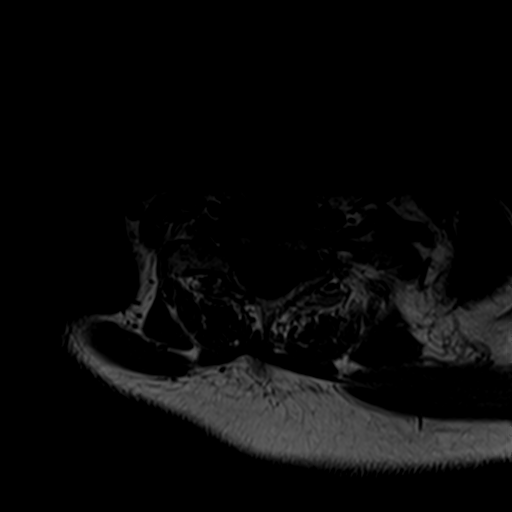
[im 8/28]
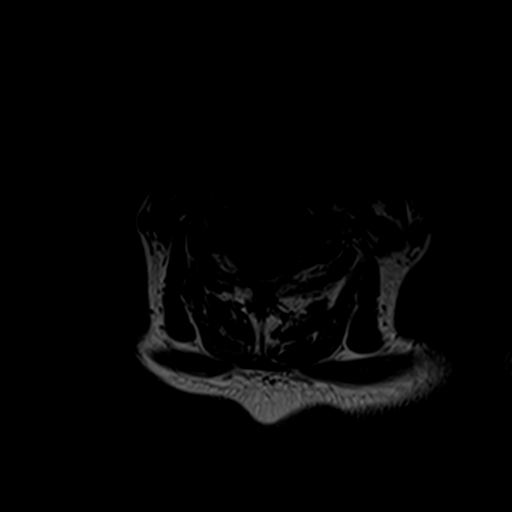
[im 12/28]
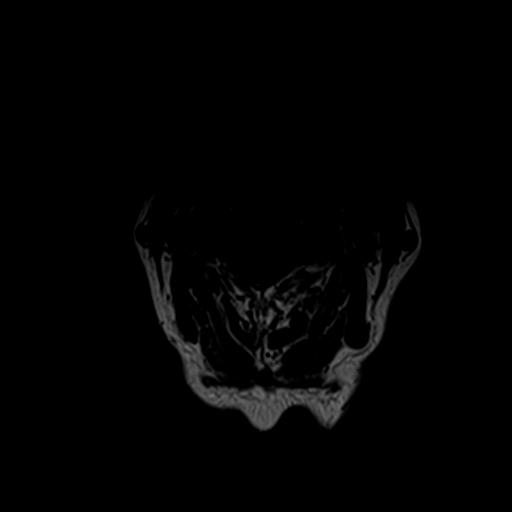
[im 16/28]
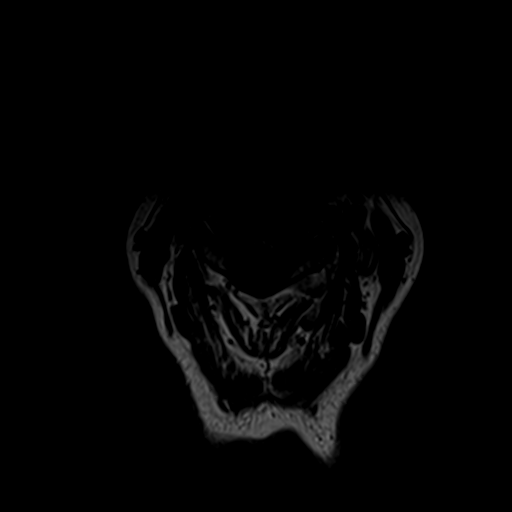
[im 20/28]
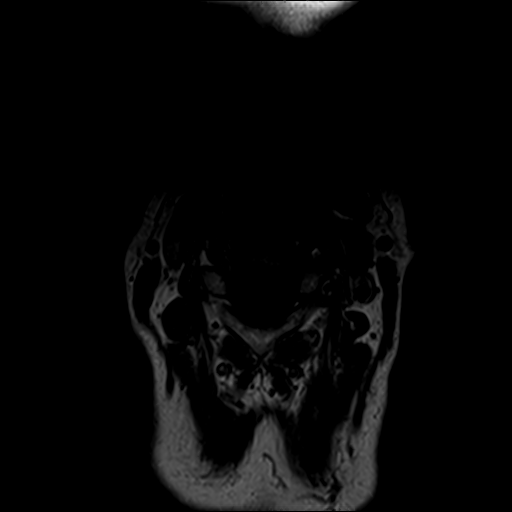
[im 24/28]
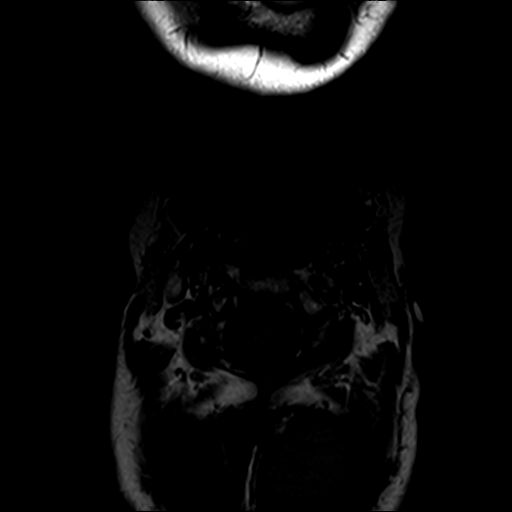
[im 28/28]
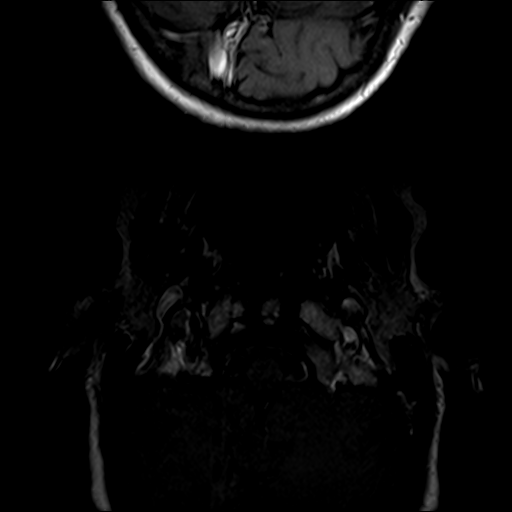

[Series 7: T2 · sagittal · 3.0mm · 0.41mm/px · 4 of 15 slices shown (2 of 2)]
[im 1/15]
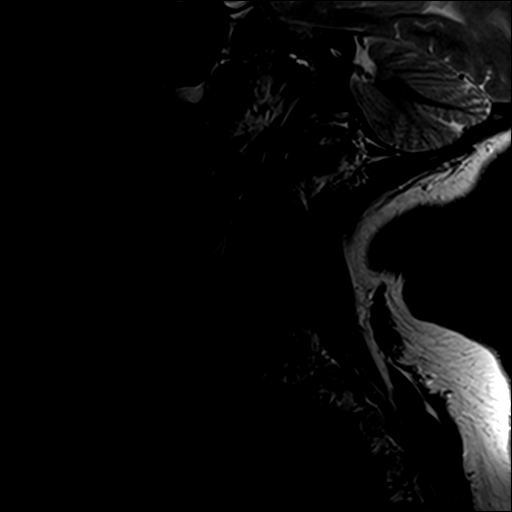
[im 5/15]
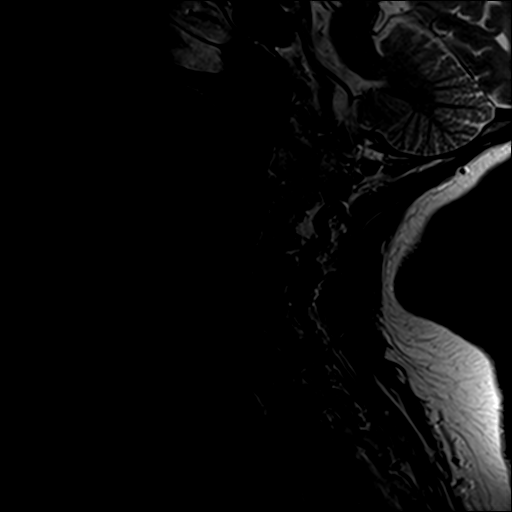
[im 10/15]
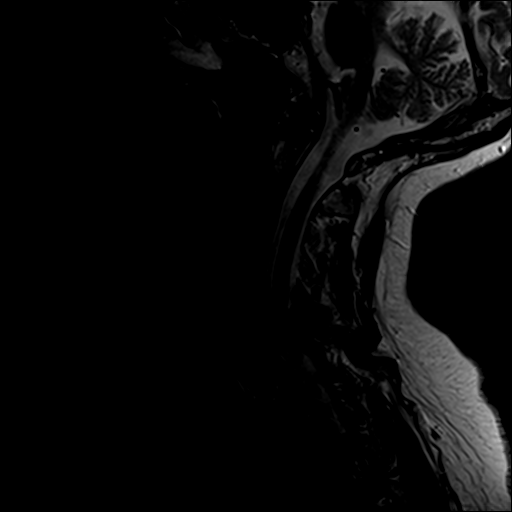
[im 15/15]
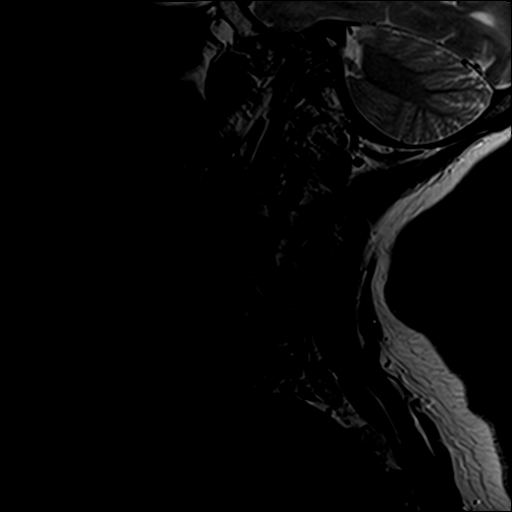

[Series 8: T1 fat-sat post-contrast · sagittal · 3.0mm · 0.82mm/px · 2 of 15 slices shown]
[im 1/15]
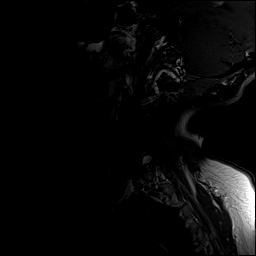
[im 5/15]
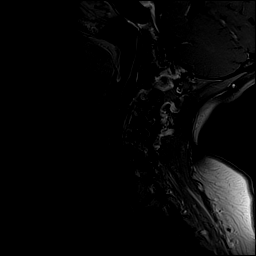

[26 of 48 positions shown; findings below may reference images not displayed]

FINDINGS: Alignment: Normal

Vertebrae: Negative for fracture or mass

Cord: Patchy abnormal signal throughout much of the cervical spinal
cord compatible with multiple sclerosis. Largest lesions are at C2
and C7. These lesions appear slightly less conspicuous. Other
additional smaller lesions stable. No lesions show abnormal
enhancement.

Posterior Fossa, vertebral arteries, paraspinal tissues: Negative

Disc levels:

C2-3: Negative

C3-4: Moderate left foraminal narrowing due to uncinate spurring and
mild facet hypertrophy. Spinal canal adequate in size

C4-5: Asymmetric facet degeneration on the left with mild left
foraminal narrowing. No interval change

C5-6: Mild uncinate spurring bilaterally. Mild facet hypertrophy.
Mild foraminal narrowing bilaterally unchanged.

C6-7: Disc degeneration with diffuse uncinate spurring. Mild
foraminal narrowing bilaterally due to spurring. No interval change

C7-T1: Negative
IMPRESSION: Multiple cord lesions compatible with chronic multiple sclerosis. No
new lesions. Largest lesions at C2 and C7 appears slightly less
conspicuous. No lesions show abnormal enhancement

Cervical spondylosis, stable from the prior study.

## 2019-11-17 IMAGING — MR MR HEAD WO/W CM
11 series · 48 of 48 positions shown · IV contrast (9ml multihance)
Comparison: MRI head [DATE]

CLINICAL DATA: Multiple sclerosis follow-up

EXAM:
MRI HEAD WITHOUT AND WITH CONTRAST
TECHNIQUE: Multiplanar, multiecho pulse sequences of the brain and surrounding
structures were obtained without and with intravenous contrast.
CONTRAST:  9mL MULTIHANCE GADOBENATE DIMEGLUMINE 529 MG/ML IV SOLN

[Series 2: T1 · sagittal · 5.0mm · 0.45mm/px · 1 of 21 slices shown]
[im 1/21]
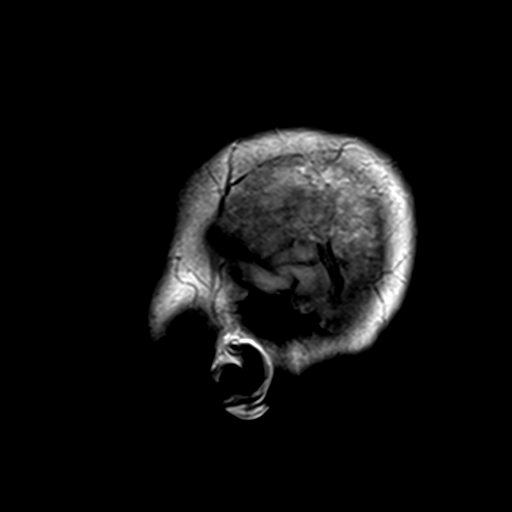

[Series 3: DWI · axial · 3.0mm · 1.80mm/px · z∈[-38,+106]mm · 7 of 100 slices shown (1 of 2)]
[im 1/100]
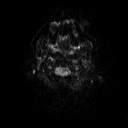
[im 17/100]
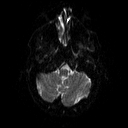
[im 34/100]
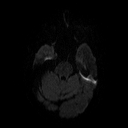
[im 50/100]
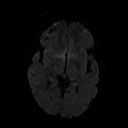
[im 67/100]
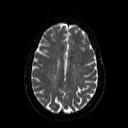
[im 83/100]
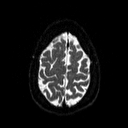
[im 100/100]
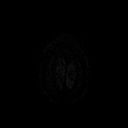

[Series 4: DWI · axial · 3.0mm · 1.80mm/px · z∈[-38,+106]mm · 4 of 46 slices shown (2 of 2)]
[im 1/46]
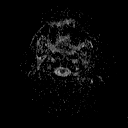
[im 16/46]
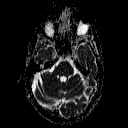
[im 31/46]
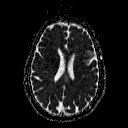
[im 46/46]
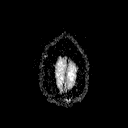

[Series 5: T2 · axial · 5.0mm · 0.72mm/px · z∈[-45,+107]mm · 2 of 24 slices shown (1 of 2)]
[im 1/24]
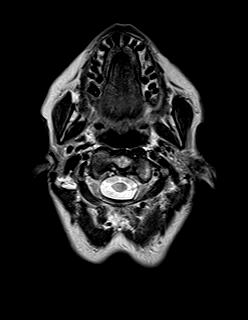
[im 24/24]
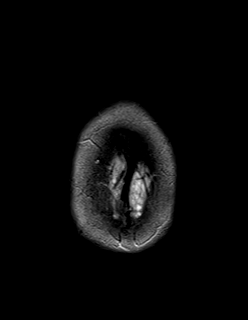

[Series 6: FLAIR · axial · 3.0mm · 0.45mm/px · z∈[-39,+107]mm · 3 of 33 slices shown (1 of 2)]
[im 1/33]
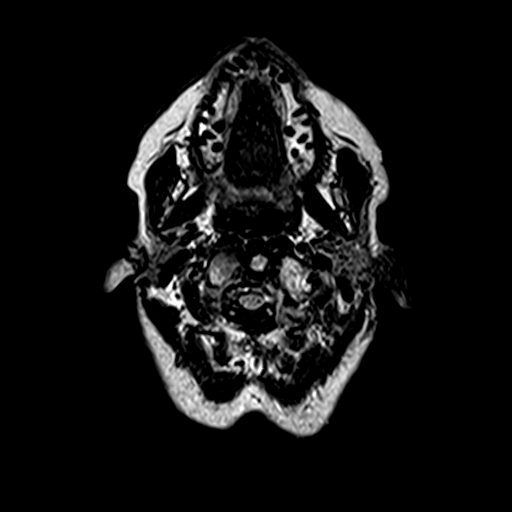
[im 17/33]
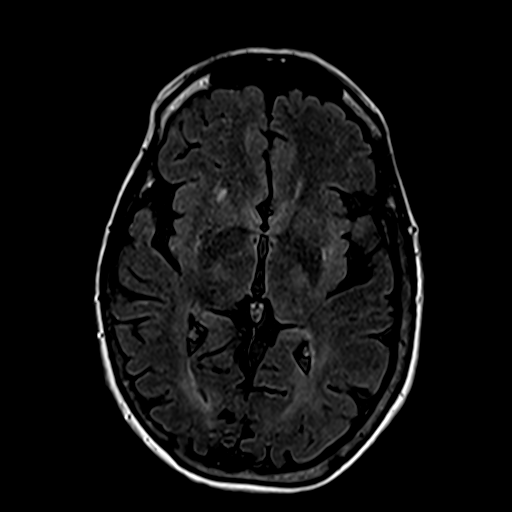
[im 33/33]
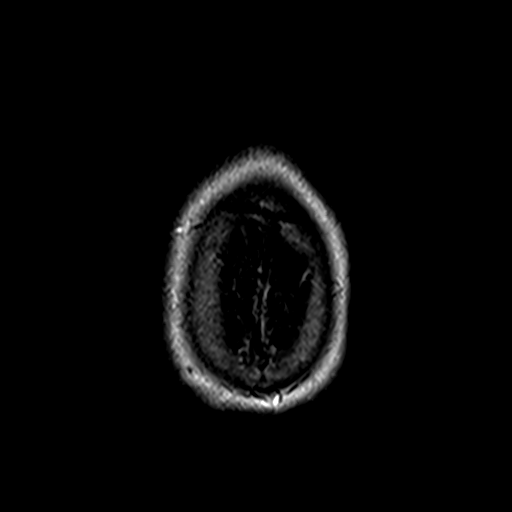

[Series 8: swi_images · axial · 4.0mm · 0.90mm/px · z∈[-35,+102]mm · 3 of 36 slices shown]
[im 1/36]
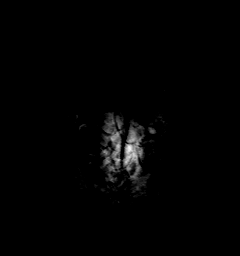
[im 18/36]
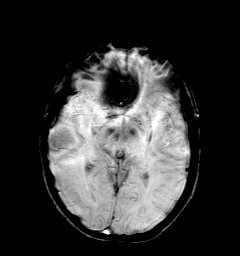
[im 36/36]
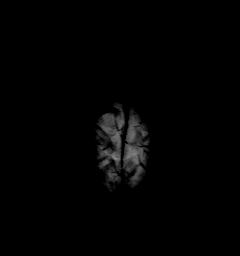

[Series 9: FLAIR · sagittal · 5.0mm · 0.45mm/px · 2 of 25 slices shown (2 of 2)]
[im 1/25]
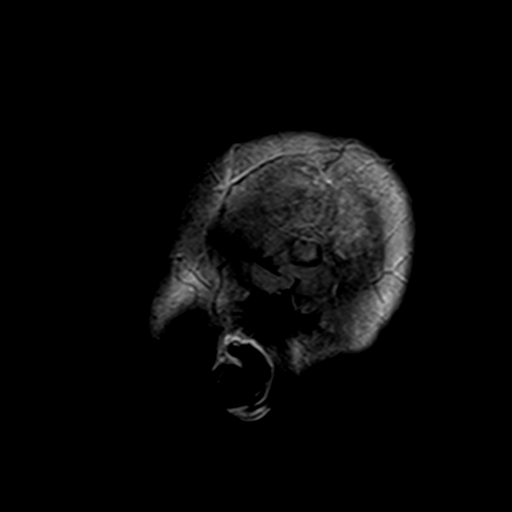
[im 25/25]
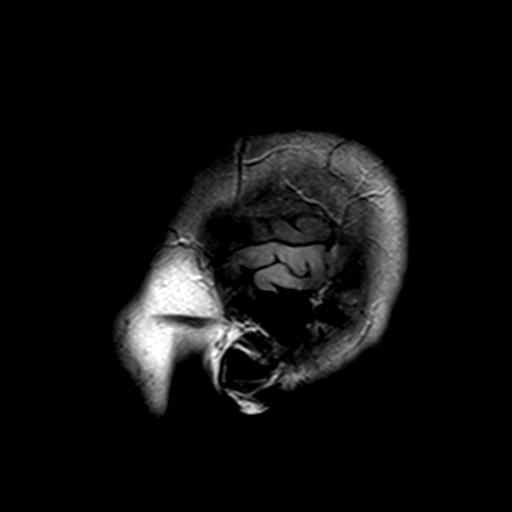

[Series 10: t1_mpr_tra · axial · 1.0mm · 0.75mm/px · z∈[-37,+102]mm · 11 of 144 slices shown (1 of 2)]
[im 1/144]
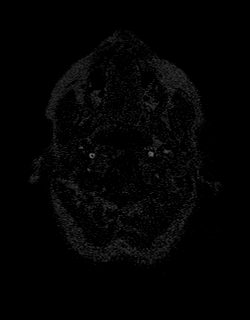
[im 15/144]
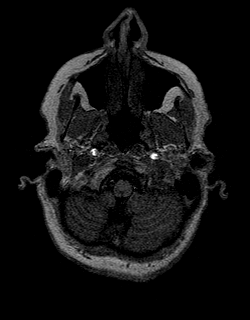
[im 29/144]
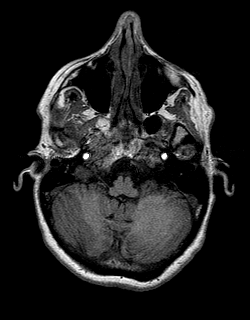
[im 43/144]
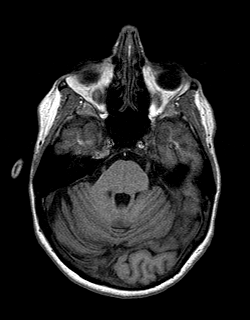
[im 58/144]
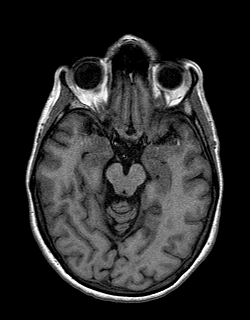
[im 72/144]
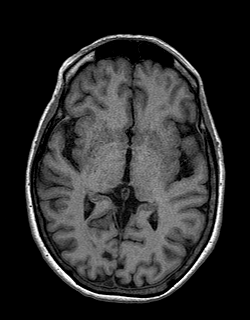
[im 86/144]
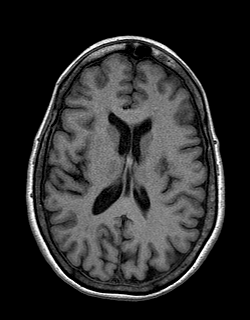
[im 101/144]
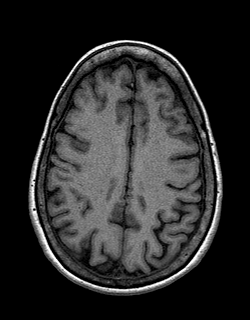
[im 115/144]
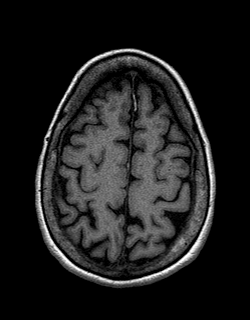
[im 129/144]
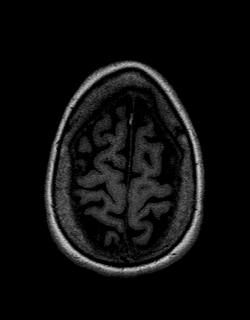
[im 144/144]
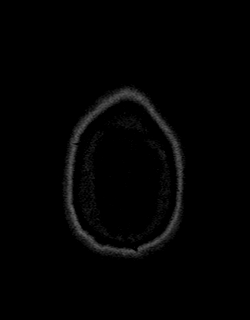

[Series 21: T2 · coronal · 5.0mm · 0.45mm/px · 2 of 25 slices shown (2 of 2)]
[im 1/25]
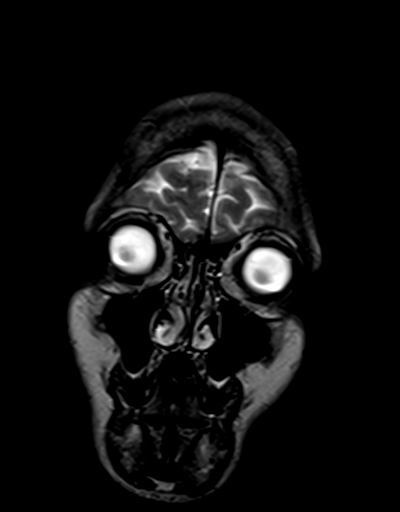
[im 25/25]
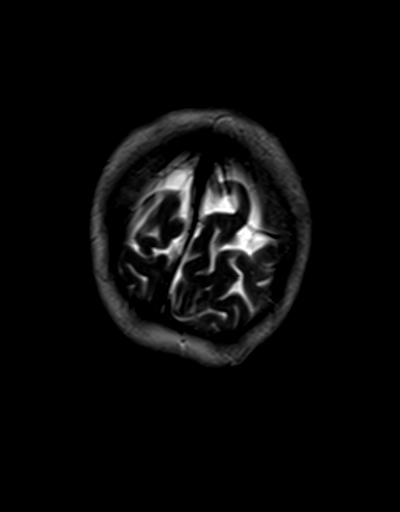

[Series 22: t1_mpr_tra · axial · 1.0mm · 0.75mm/px · z∈[-37,+102]mm · 11 of 144 slices shown (2 of 2)]
[im 1/144]
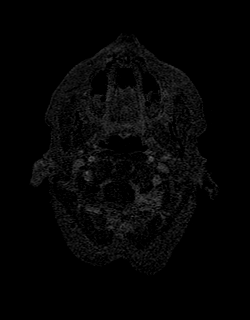
[im 15/144]
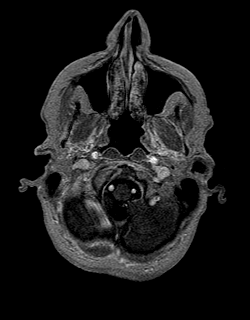
[im 29/144]
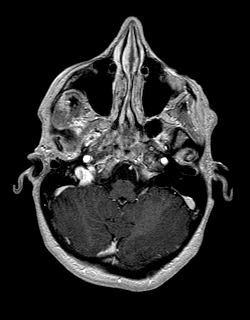
[im 43/144]
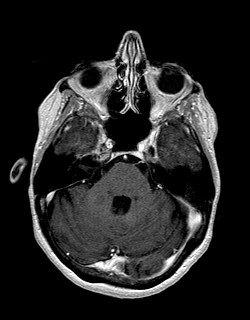
[im 58/144]
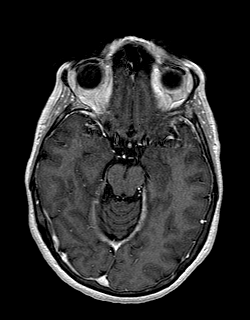
[im 72/144]
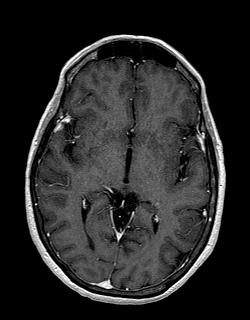
[im 86/144]
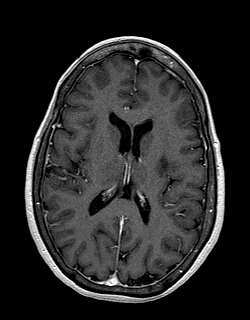
[im 101/144]
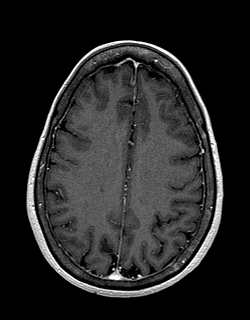
[im 115/144]
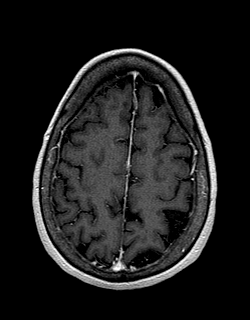
[im 129/144]
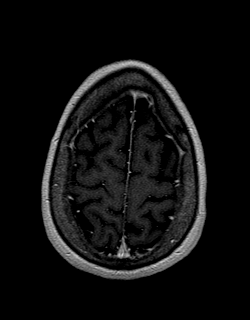
[im 144/144]
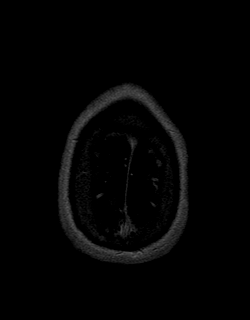

[Series 23: post cor · coronal · 5.0mm · 0.45mm/px · 2 of 25 slices shown]
[im 1/25]
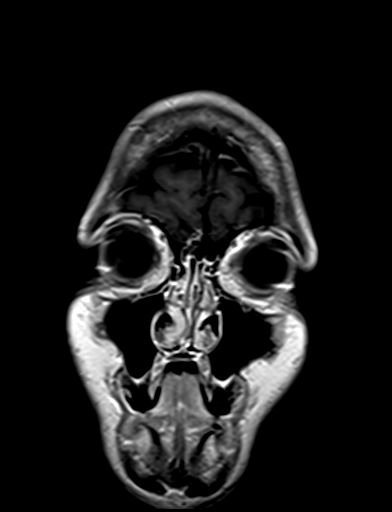
[im 25/25]
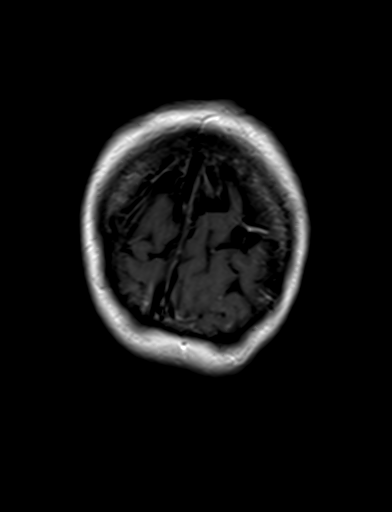

[48 of 48 positions shown; findings below may reference images not displayed]

FINDINGS: Brain: Ventricle size normal. Negative for acute infarct. Negative
for hemorrhage or mass.

Mild periventricular deep white matter hyperintensities are stable
from the prior study. Largest lesion is above the insula involving
external and internal capsule fibers. No lesions show restricted
diffusion or abnormal enhancement. No new lesions. Posterior fossa
negative.

Normal enhancement following contrast infusion.

Vascular: Normal arterial flow voids.

Skull and upper cervical spine: Negative

Sinuses/Orbits: Paranasal sinuses clear.  Negative orbit

Other: None
IMPRESSION: Relatively mild white matter changes consistent with multiple
sclerosis. Stable MRI without change from [DATE].

## 2019-11-17 MED ORDER — GADOBENATE DIMEGLUMINE 529 MG/ML IV SOLN
9.0000 mL | Freq: Once | INTRAVENOUS | Status: AC | PRN
  Administered 2019-11-17: 9 mL via INTRAVENOUS

## 2019-11-19 ENCOUNTER — Telehealth: Payer: Self-pay

## 2019-11-19 NOTE — Telephone Encounter (Signed)
Patient left message with accessnurse returning a call to [facility]

## 2019-11-19 NOTE — Telephone Encounter (Signed)
-----   Message from Pieter Partridge, DO sent at 11/19/2019  7:09 AM EST ----- MRIs are stable.  Will discuss further at follow-up

## 2019-11-19 NOTE — Telephone Encounter (Signed)
Called patient and informed her of stable MRI. Patient verbalized understanding and had no questions.

## 2019-11-19 NOTE — Telephone Encounter (Signed)
Left message for a call back.

## 2019-11-25 NOTE — Progress Notes (Addendum)
NEUROLOGY FOLLOW UP OFFICE NOTE  RASHAE ROTHER [id number]   Subjective:  Jessica Salazar is a 30 year oldright-handed Caucasian woman who follows up for multiple sclerosis. Optho note reviewed.  UPDATE: Current DMT:  Ocrevus (last infusion in June); D3 2000 IU daily  06/26/2019 LABS:  IgG 922, IgA 185, IgM 79  Eye exam in August demonstrated NFL thinning in left eye but otherwise negative.  MRIs personally reviewed: 11/17/2019 MRI BRAIN W WO:  Relatively mild white matter changes consistent with multiple sclerosis. Stable MRI without change from 12/16/2018. 11/17/2019 MRI CERVICAL SPINE W WO:  Multiple cord lesions compatible with chronic multiple sclerosis. No new lesions. Largest lesions at C2 and C7 appears slightly less conspicuous. No lesions show abnormal enhancement.  Cervical spondylosis, stable from the prior study.  Vision:Left worse than right blurred vision. Motor: She feels worsening right sided weakness, especially the leg.  Sometimes she has been favoring the left side.  This has been ongoing for 6 months, some days better than others.   Sensory: Paresthesiasof left hand andfrom below chest down to and including both feet. Pain: Chronic pain in right leg secondary to prior trauma/fractures andhyperextension/pain of rightknee pain. Gait: Unsteady.  Due to right sided weakness.  Looking at getting a standup walker.  Had to cancel last 4 sessions of physical therapy since dropping a canister of sugar on her right foot, which was bruised but no fractures.   Bowel/Bladder: Increased urinary frequency and urgency. Needs to get up and urinate up to 4 times during the night. Has a recent UTI.  Constipation. Fatigue: Mild fatigue. Able to function.  Cognition: Mild word-finding issues Mood: Denies depression or anxiety. She gets nauseous from time to time.  Uses promethazine.  HISTORY: She was diagnosed with multiple sclerosis in 1993 after presenting  with painful burning and tingling sensation in the upper back and chest as well as difficulty with balance. Diagnosis was concerned via MRI. She did not undergo lumbar puncture. She had recurrent episodes of optic neuritis in the mid 2000s requiring treatment with Solu-Medrol. Other exacerbations include paresthesias and increased difficulty with balance and typically right sided weakness. She last saw a neurologist in 2018, Dr. Arlice Colt. At that time, she was started on Aubagio but caused hair loss and didn't think she was getting relief so she stopped.  She initially was started onAvonex in late-90s or early 2000s. She started Rebiffaround 2003 but had decreased appetite and stopped after 2 years.She was on Aubagio in 2018 for 2 months.  She does not like the idea of taking DMT.  In September 2020, she had a flare up of right sided weakness. She received Solu-Medrol and symptoms dissipated 3 to 4 weeks but still wears an ankle brace because her foot turns in. Her previous significant exacerbation occurred 2 to 3 years ago.   Past DMT: Rebif (did not like the way it made her feel); Avonex (did not like the way it made her feel); Aubagio (hair loss)  Imaging: 06/20/2016 MRI BRAIN WO W: Multiple T2/FLAIR hyperintense foci in the cerebral hemispheres, primarily involving the periventricular and juxtacortical white matter. No involvement of cerebellum and brainstem. Normal enhancement pattern (reportedly stable compared to prior imaging in 2011). 06/20/2016 MRI CERVICAL WO W: Hyperintense foci within spinal cord at C4-C5, C5, C6, C7 and possibly C3-C4 levels consistent with chronic demyelinating plaques (reportedly stable compared to prior imaging in 2011). 12/16/2018 MRI BRAIN W WO (personally reviewed):  1. Patchy T2/FLAIR hyperintensities  involving the periventricular, deep, and juxta cortical white matter of both cerebral hemispheres as above, consistent with provided  history of chronic multiple sclerosis. Overall, appearance is similar as compared to previous exam from 2018, without evidence for significant disease progression. No evidence for active demyelination. 2. No other acute intracranial abnormality identified. 12/16/2018 MRI CERVICAL SPINE W WO:  1. Extensive patchy signal abnormality throughout the cervical spinal cord as detailed above, compatible with history of chronic multiple sclerosis. Overall, appearance is similar as compared to previous exam from 2018, without evidence for significant disease progression. No evidence for active demyelination.  2. Left foraminal disc osteophyte complex and facet degeneration at C3-4 with resultant moderate left C4 foraminal stenosis.  3. Additional multifactorial degenerative changes with resultant mild to moderate bilateral C5 through C7 foraminal narrowing as above.  Labs: 06/05/2018: Quantiferon TB Gold negative 11/21/2018 LABS:  Hep B S Ab non-reactive  Unknown family history of MS. Her father passed away when she was a child and she is not familiar with her mother's side of family.  PAST MEDICAL HISTORY: Past Medical History:  Diagnosis Date  . Arthritis   . Constipation    due to MS  . Heart murmur    MVP  . History of recurrent UTIs   . Hypotension   . Mitral valve prolapse   . Multiple sclerosis ([place])   . Neuromuscular disorder ([place])    has /NMS stimulator   . Vision abnormalities     MEDICATIONS: Current Outpatient Medications on File Prior to Visit  Medication Sig Dispense Refill  . Cholecalciferol (VITAMIN D) 50 MCG (2000 UT) CAPS     . diazepam (VALIUM) 5 MG tablet Take 1 tablet 40 to 60 minutes prior to MRI 1 tablet 0  . Multiple Vitamins-Minerals (MULTIVITAMIN GUMMIES ADULT PO) Take by mouth. Olly gummy multivitamin 2 po daily    . nitrofurantoin, macrocrystal-monohydrate, (MACROBID) 100 MG capsule Take 1 capsule (100 mg total) by mouth 2 (two) times daily. 14 capsule 0  .  Ocrelizumab (OCREVUS IV) Inject into the vein.    . promethazine (PHENERGAN) 25 MG tablet Take 1 tablet (25 mg total) by mouth every 6 (six) hours as needed for nausea or vomiting. 30 tablet 0   No current facility-administered medications on file prior to visit.    ALLERGIES: No Known Allergies  FAMILY HISTORY: Family History  Problem Relation Age of Onset  . Heart attack Father   . Down syndrome Sister   . Healthy Child   . Colon polyps Neg Hx   . Colon cancer Neg Hx   . Esophageal cancer Neg Hx   . Rectal cancer Neg Hx   . Stomach cancer Neg Hx     SOCIAL HISTORY: Social History   Socioeconomic History  . Marital status: Single    Spouse name: Not on file  . Number of children: 1  . Years of education: Not on file  . Highest education level: Some college, no degree  Occupational History  . Not on file  Tobacco Use  . Smoking status: Former Research scientist (life sciences)  . Smokeless tobacco: Never Used  Vaping Use  . Vaping Use: Never used  Substance and Sexual Activity  . Alcohol use: Yes    Comment: Rare  . Drug use: No  . Sexual activity: Not on file  Other Topics Concern  . Not on file  Social History Narrative   Pt is single with boyfriends she has 1 child right handed   Lives  in 1 story home   Drinks coffee daily, no tea rarely drinks soda   Social Determinants of Health   Financial Resource Strain:   . Difficulty of Paying Living Expenses: Not on file  Food Insecurity:   . Worried About Charity fundraiser in the Last Year: Not on file  . Ran Out of Food in the Last Year: Not on file  Transportation Needs:   . Lack of Transportation (Medical): Not on file  . Lack of Transportation (Non-Medical): Not on file  Physical Activity:   . Days of Exercise per Week: Not on file  . Minutes of Exercise per Session: Not on file  Stress:   . Feeling of Stress : Not on file  Social Connections:   . Frequency of Communication with Friends and Family: Not on file  . Frequency of  Social Gatherings with Friends and Family: Not on file  . Attends Religious Services: Not on file  . Active Member of Clubs or Organizations: Not on file  . Attends Archivist Meetings: Not on file  . Marital Status: Not on file  Intimate Partner Violence:   . Fear of Current or Ex-Partner: Not on file  . Emotionally Abused: Not on file  . Physically Abused: Not on file  . Sexually Abused: Not on file     Objective:  Blood pressure 122/75, pulse 64, height 5\' 4"  (1.626 m), weight 111 lb 3.2 oz (50.4 kg), SpO2 100 %. General: No acute distress.  Patient appears well-groomed.   Head:  Normocephalic/atraumatic Eyes:  Fundi examined but not visualized Neck: supple, no paraspinal tenderness, full range of motion Heart:  Regular rate and rhythm Lungs:  Clear to auscultation bilaterally Back: No paraspinal tenderness Neurological Exam: alert and oriented to person, place, and time. Attention span and concentration intact, recent and remote memory intact, fund of knowledge intact.  Speech fluent and not dysarthric, language intact.  Right upper quadrant vision loss in left eye.  Otherwise, CN II-XII intact. Bulk and tone normal, Decreased finger-thumb tapping speed slow on right.  Muscle strength 2/5 right hip flexion, otherwise otherwise 5-/5 throughout.  Pinprick sensation reduced up to above knees bilaterally and right upper extremity.  Reduced vibratory sensation in feet up to knees.  Deep tendon reflexes 3+ throughout (right slightly greater than left); bilateral Babinski.  and heel to shin testing intact.  Wide based, right hemiplegic gait.  Romberg with sway.   Assessment/Plan:   Multiple sclerosis.  Recent fluctuations in right sided weakness likely pseudo-exacerbations during the summer months.  Hopefully will improve as we are going into cooler weather.  1.  Check quantitative immunoglobulin panel and vit D level today and again in 6 months. 2.  Continue Ocrevus and D3 3.   Follow up 6 months.  Metta Clines, DO  CC: Marrian Salvage, [place]

## 2019-11-26 ENCOUNTER — Other Ambulatory Visit (INDEPENDENT_AMBULATORY_CARE_PROVIDER_SITE_OTHER): Payer: BC Managed Care – PPO

## 2019-11-26 ENCOUNTER — Other Ambulatory Visit: Payer: Self-pay

## 2019-11-26 ENCOUNTER — Ambulatory Visit (INDEPENDENT_AMBULATORY_CARE_PROVIDER_SITE_OTHER): Payer: BC Managed Care – PPO | Admitting: Neurology

## 2019-11-26 ENCOUNTER — Encounter: Payer: Self-pay | Admitting: Neurology

## 2019-11-26 VITALS — BP 122/75 | HR 64 | Ht 64.0 in | Wt 111.2 lb

## 2019-11-26 DIAGNOSIS — G35 Multiple sclerosis: Secondary | ICD-10-CM

## 2019-11-26 LAB — VITAMIN D 25 HYDROXY (VIT D DEFICIENCY, FRACTURES): VITD: 55.13 ng/mL (ref 30.00–100.00)

## 2019-11-26 NOTE — Patient Instructions (Signed)
1.  Check IgG/IgA/IgM and vitamin D.  Repeat in 6 months. 2.  Continue Ocrevus and D3 3.  Follow up in 6 months.

## 2019-11-27 LAB — IGG, IGA, IGM
IgG (Immunoglobin G), Serum: 897 mg/dL (ref 600–1640)
IgM, Serum: 72 mg/dL (ref 50–300)
Immunoglobulin A: 190 mg/dL (ref 47–310)

## 2019-12-10 ENCOUNTER — Other Ambulatory Visit: Payer: Self-pay

## 2019-12-10 MED ORDER — PREDNISONE 10 MG (21) PO TBPK
ORAL_TABLET | ORAL | 0 refills | Status: DC
Start: 2019-12-10 — End: 2020-08-15

## 2019-12-10 MED ORDER — PREDNISONE 10 MG (21) PO TBPK: ORAL_TABLET | ORAL | 0 refills | Status: DC

## 2019-12-26 ENCOUNTER — Encounter: Payer: Self-pay | Admitting: Neurology

## 2019-12-26 NOTE — Progress Notes (Signed)
Received fax approval for the Ocrevus infusion: Valid from 06/08/19 to 06/07/20. Auth #: [id number] approved for 1800 units. Location: [facility] for [employer] option.

## 2020-01-08 ENCOUNTER — Other Ambulatory Visit: Payer: BC Managed Care – PPO

## 2020-01-08 DIAGNOSIS — Z20822 Contact with and (suspected) exposure to covid-19: Secondary | ICD-10-CM

## 2020-01-09 LAB — NOVEL CORONAVIRUS, NAA: SARS-CoV-2, NAA: NOT DETECTED

## 2020-01-09 LAB — SARS-COV-2, NAA 2 DAY TAT

## 2020-02-28 ENCOUNTER — Encounter: Payer: Self-pay | Admitting: Family

## 2020-02-29 ENCOUNTER — Other Ambulatory Visit: Payer: Self-pay | Admitting: Family

## 2020-02-29 MED ORDER — CEPHALEXIN 500 MG PO CAPS: 500.0000 mg | ORAL_CAPSULE | Freq: Three times a day (TID) | ORAL | 0 refills | Status: DC

## 2020-05-27 NOTE — Progress Notes (Signed)
NEUROLOGY FOLLOW UP OFFICE NOTE  ZANNA HAWN [id number]  Assessment/Plan:   Multiple sclerosis - reports worsening right sided weakness.  This has been ongoing for months.  MRIs in November stable.  I suspect it is aggravation of chronic symptoms related to warmer weather and also not having been in PT and exercising.  However, given her concerns, will repeat MRI to evaluate for disease progression  1.  MRI of brain and cervical spine with and without contrast  2.  DMT:  De Nurse.  If MRI shows disease progression, she will follow up to discuss change in DMT 2.  D3 2000 IU daily 3.  Check quantitative immunoglobulin panel and vit D today and again in 6 months. 4.  If MRIs stable, will follow up in 6 months.  Advised to see if there is a facility for PT/OT closer to work.  Subjective:  Juliya A. Rozeboom is a 71 year oldright-handed Caucasian woman whofollows upfor multiple sclerosis. she is accompanied by her significant other.  UPDATE: Current DMT: Ocrevus (last infusion in June); D3 2000 IU daily  11/26/2019 LABS:  IgA 190, IgG 897, IgM 72; vit D 55.13  Vision:Left worse than right blurred vision. Motor: She feels that her right side is weaker than last visit.  She feels that she is dragging her right leg.  She has increased trouble using her right hand.  She says warm weather aggravates her symptoms.  She also had to stop PT because she relocated for work and it is too far.   Sensory: Paresthesiasof left hand andfrom below chest down to and including both feet. Pain: Chronic pain in right leg secondary to prior trauma/fractures andhyperextension/pain of rightknee pain. Gait: Unsteady.  Due to right sided weakness.  Looking at getting a standup walker.  Had to cancel last 4 sessions of physical therapy since dropping a canister of sugar on her right foot, which was bruised but no fractures.   Bowel/Bladder: Increased urinary frequency and urgency. Needs to get up  and urinate up to 4 times during the night.Has a recent UTI.Constipation. Fatigue: Mild fatigue. Able to function.  Cognition: Mild word-finding issues Mood: Denies depression or anxiety. She gets nauseous from time to time. Uses promethazine.  HISTORY: She was diagnosed with multiple sclerosis in 1993 after presenting with painful burning and tingling sensation in the upper back and chest as well as difficulty with balance. Diagnosis was concerned via MRI. She did not undergo lumbar puncture. She had recurrent episodes of optic neuritis in the mid 2000s requiring treatment with Solu-Medrol. Other exacerbations include paresthesias and increased difficulty with balance and typically right sided weakness. She last saw a neurologist in 2018, Dr. Arlice Colt. At that time, she was started on Aubagio but caused hair loss and didn't think she was getting relief so she stopped.  She initially was started onAvonex in late-90s or early 2000s. She started Rebiffaround 2003 but had decreased appetite and stopped after 2 years.She was on Aubagio in 2018 for 2 months.  She does not like the idea of taking DMT.  In September 2020, she had a flare up of right sided weakness. She received Solu-Medrol and symptoms dissipated 3 to 4 weeks but still wears an ankle brace because her foot turns in. Her previous significant exacerbation occurred 2 to 3 years ago.   Past DMT: Rebif (did not like the way it made her feel); Avonex (did not like the way it made her feel); Aubagio (hair loss)  Imaging:  06/20/2016 MRI BRAIN WO W: Multiple T2/FLAIR hyperintense foci in the cerebral hemispheres, primarily involving the periventricular and juxtacortical white matter. No involvement of cerebellum and brainstem. Normal enhancement pattern (reportedly stable compared to prior imaging in 2011). 06/20/2016 MRI CERVICAL WO W: Hyperintense foci within spinal cord at C4-C5, C5, C6, C7 and possibly  C3-C4 levels consistent with chronic demyelinating plaques (reportedly stable compared to prior imaging in 2011). 12/16/2018 MRI BRAIN W WO (personally reviewed):1. Patchy T2/FLAIR hyperintensities involving the periventricular, deep, and juxta cortical white matter of both cerebral hemispheres as above, consistent with provided history of chronic multiple sclerosis. Overall, appearance is similar as compared to previous exam from 2018, without evidence for significant disease progression. No evidence for active demyelination. 2. No other acute intracranial abnormality identified. 12/16/2018 MRI CERVICAL SPINE W WO:1. Extensive patchy signal abnormality throughout the cervical spinal cord as detailed above, compatible with history of chronic multiple sclerosis. Overall, appearance is similar as compared to previous exam from 2018, without evidence for significant disease progression. No evidence for active demyelination. 2. Left foraminal disc osteophyte complex and facet degeneration at C3-4 with resultant moderate left C4 foraminal stenosis. 3. Additional multifactorial degenerative changes with resultant mild to moderate bilateral C5 through C7 foraminal narrowing as above. 11/17/2019 MRI BRAIN W WO:  Relatively mild white matter changes consistent with multiple sclerosis. Stable MRI without change from 12/16/2018. 11/17/2019 MRI CERVICAL SPINE W WO:  Multiple cord lesions compatible with chronic multiple sclerosis. No new lesions. Largest lesions at C2 and C7 appears slightly less conspicuous. No lesions show abnormal enhancement.  Cervical spondylosis, stable from the prior study.  Labs: 06/05/2018: Quantiferon TB Gold negative 11/21/2018 LABS: Hep B S Ab non-reactive  Unknown family history of MS. Her father passed away when she was a child and she is not familiar with her mother's side of family.  PAST MEDICAL HISTORY: Past Medical History:  Diagnosis Date  . Arthritis   .  Constipation    due to MS  . Heart murmur    MVP  . History of recurrent UTIs   . Hypotension   . Mitral valve prolapse   . Multiple sclerosis ([place])   . Neuromuscular disorder ([place])    has /NMS stimulator   . Vision abnormalities     MEDICATIONS: Current Outpatient Medications on File Prior to Visit  Medication Sig Dispense Refill  . cephALEXin (KEFLEX) 500 MG capsule Take 1 capsule (500 mg total) by mouth 3 (three) times daily. 15 capsule 0  . Cholecalciferol (VITAMIN D) 50 MCG (2000 UT) CAPS     . Multiple Vitamins-Minerals (MULTIVITAMIN GUMMIES ADULT PO) Take by mouth. Olly gummy multivitamin 2 po daily    . Ocrelizumab (OCREVUS IV) Inject into the vein.    . predniSONE (STERAPRED UNI-PAK 21 TAB) 10 MG (21) TBPK tablet 60mg  on day 1, then 50mg  on day 2, then 40mg  on day 3, then 30mg  on day 4, then 20mg  on day 5, then 10mg  on day 6, then STOP (quantity 21, refills 0) 21 tablet 0  . promethazine (PHENERGAN) 25 MG tablet Take 1 tablet (25 mg total) by mouth every 6 (six) hours as needed for nausea or vomiting. 30 tablet 0   No current facility-administered medications on file prior to visit.    ALLERGIES: No Known Allergies  FAMILY HISTORY: Family History  Problem Relation Age of Onset  . Heart attack Father   . Down syndrome Sister   . Healthy Child   . Colon polyps  Neg Hx   . Colon cancer Neg Hx   . Esophageal cancer Neg Hx   . Rectal cancer Neg Hx   . Stomach cancer Neg Hx       Objective:  Blood pressure 121/79, pulse 64, resp. rate 18, height 5\' 4"  (1.626 m), weight 109 lb (49.4 kg), SpO2 97 %. General: No acute distress.  Patient appears well-groomed.   Head:  Normocephalic/atraumatic Eyes:  Fundi examined but not visualized Neck: supple, no paraspinal tenderness, full range of motion Heart:  Regular rate and rhythm Lungs:  Clear to auscultation bilaterally Back: No paraspinal tenderness Neurological Exam: alert and oriented to person, place, and time. Speech  fluent and not dysarthric, language intact. Right upper quadrant vision loss in left eye not appreciated on today's exam. Otherwise,CN II-XII intact. Bulk and tone normal,Decreased finger-thumb tapping speed slow on right.  Muscle strength 4+/5 right APB, 2/5 right hip flexion, otherwise otherwise 5-/5 throughout. Pinprick sensation reduced up to above knees bilaterally and right upper extremity. Reduced vibratory sensation in feet up to knees. Deep tendon reflexes 3+ throughout (right slightly greater than left); bilateral Babinski. and heel to shin testing intact.Wide based, right hemiplegic gait with short stride.Romberg with sway.     Metta Clines, DO  CC: Marrian Salvage, [place]

## 2020-05-29 ENCOUNTER — Other Ambulatory Visit: Payer: Self-pay

## 2020-05-29 ENCOUNTER — Encounter: Payer: Self-pay | Admitting: Neurology

## 2020-05-29 ENCOUNTER — Other Ambulatory Visit (INDEPENDENT_AMBULATORY_CARE_PROVIDER_SITE_OTHER): Payer: BC Managed Care – PPO

## 2020-05-29 ENCOUNTER — Ambulatory Visit: Payer: BC Managed Care – PPO | Admitting: Neurology

## 2020-05-29 VITALS — BP 121/79 | HR 64 | Resp 18 | Ht 64.0 in | Wt 109.0 lb

## 2020-05-29 DIAGNOSIS — G35 Multiple sclerosis: Secondary | ICD-10-CM | POA: Diagnosis not present

## 2020-05-29 DIAGNOSIS — M7061 Trochanteric bursitis, right hip: Secondary | ICD-10-CM | POA: Diagnosis not present

## 2020-05-29 LAB — VITAMIN D 25 HYDROXY (VIT D DEFICIENCY, FRACTURES): VITD: 45.27 ng/mL (ref 30.00–100.00)

## 2020-05-29 NOTE — Patient Instructions (Addendum)
1.  Continue Ocrevus for now 2.  Check vitamin D, IgG/IgA/IgM 3.  Repeat MRI of brain and cervical spine with and without contrast 4.  If you are able to find a place for PT/OT, let me know and I will send the order 5.  If MRI suggestive of progressed or new MS lesion, will have you follow up soon.  Otherwise 6 months.  We have sent a referral to [facility] for your MRI and they will call you directly to schedule your appointment. They are located at [place]. If you need to contact them directly please call [phone].  We have sent a referral to [facility] for your MRI and they will call you directly to schedule your appointment. They are located at [place]. If you need to contact them directly please call [phone].

## 2020-05-30 LAB — IGG, IGA, IGM
IgG (Immunoglobin G), Serum: 878 mg/dL (ref 600–1640)
IgM, Serum: 69 mg/dL (ref 50–300)
Immunoglobulin A: 185 mg/dL (ref 47–310)

## 2020-05-30 NOTE — Progress Notes (Signed)
Pt advised of her labs results.  

## 2020-06-03 ENCOUNTER — Other Ambulatory Visit: Payer: Self-pay | Admitting: Neurology

## 2020-06-03 DIAGNOSIS — G35 Multiple sclerosis: Secondary | ICD-10-CM

## 2020-06-05 NOTE — Progress Notes (Signed)
Health Plan: Solution Name: [facility] Scheduled Date of Service: 06/05/2020 Member Information Jessica Salazar, Jessica Salazar Member #: [id number] [place], [place], [id number] Date Of Birth: Aug 27, 1965 Phone: [phone]    Based on the date and type of service entered, prior authorization by [place] is not required. Please contact the health plan if additional information is needed.

## 2020-06-10 ENCOUNTER — Other Ambulatory Visit: Payer: Self-pay | Admitting: Neurology

## 2020-06-10 MED ORDER — DIAZEPAM 5 MG PO TABS: 5.0000 mg | ORAL_TABLET | Freq: Once | ORAL | 0 refills | Status: AC

## 2020-06-14 ENCOUNTER — Other Ambulatory Visit: Payer: Self-pay

## 2020-06-14 ENCOUNTER — Ambulatory Visit
Admission: RE | Admit: 2020-06-14 | Discharge: 2020-06-14 | Disposition: A | Discharge status: Home / Self Care | Payer: BC Managed Care – PPO | Source: Ambulatory Visit | Attending: Neurology | Admitting: Neurology

## 2020-06-14 DIAGNOSIS — G35 Multiple sclerosis: Secondary | ICD-10-CM

## 2020-06-14 IMAGING — MR MR CERVICAL SPINE WO/W CM
5 of 8 series · 25 of 48 positions shown · IV contrast (multihance)
Comparison: MRI of the cervical spine [DATE]

CLINICAL DATA: Multiple sclerosis. No new symptoms.

EXAM:
MRI CERVICAL SPINE WITHOUT AND WITH CONTRAST
TECHNIQUE: Multiplanar and multiecho pulse sequences of the cervical spine, to
include the craniocervical junction and cervicothoracic junction,
were obtained without and with intravenous contrast.
CONTRAST:  10mL MULTIHANCE GADOBENATE DIMEGLUMINE 529 MG/ML IV SOLN

[Series 3: T1 · sagittal · 3.0mm · 0.41mm/px · 4 of 13 slices shown (1 of 2)]
[im 1/13]
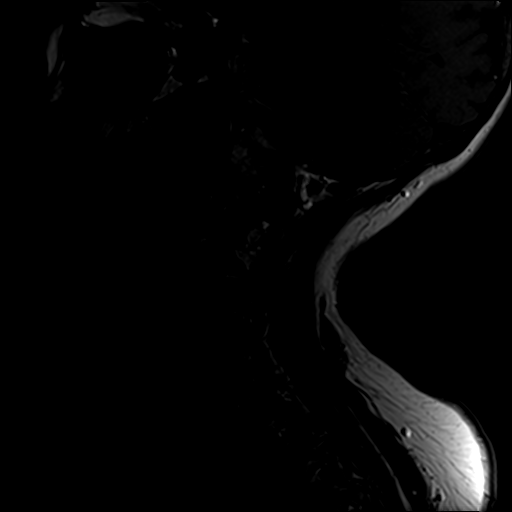
[im 5/13]
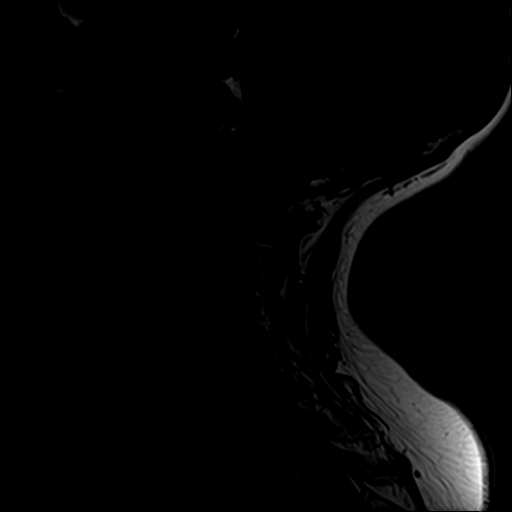
[im 9/13]
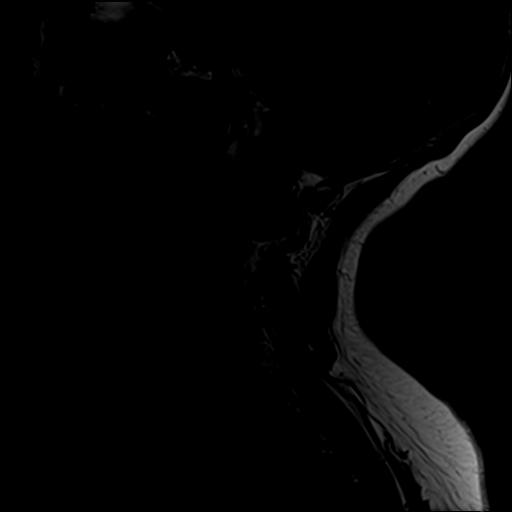
[im 13/13]
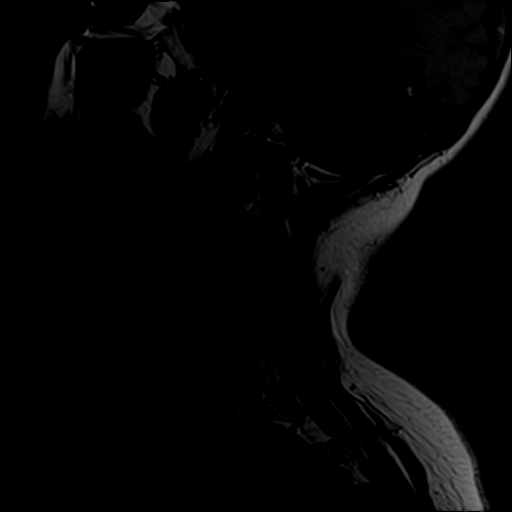

[Series 5: T2 · axial · 3.0mm · 0.70mm/px · z∈[-171,-76]mm · 8 of 26 slices shown (1 of 2)]
[im 1/26]
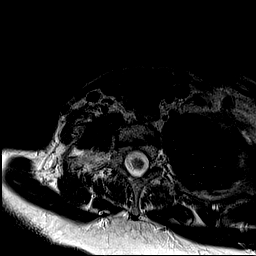
[im 4/26]
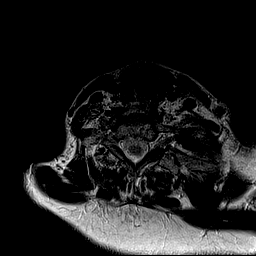
[im 8/26]
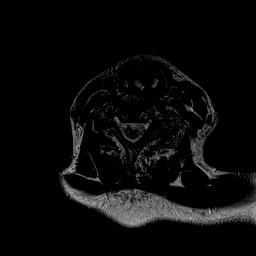
[im 11/26]
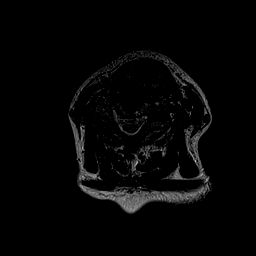
[im 15/26]
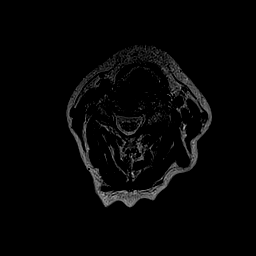
[im 18/26]
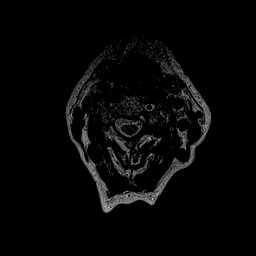
[im 22/26]
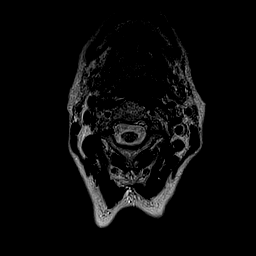
[im 26/26]
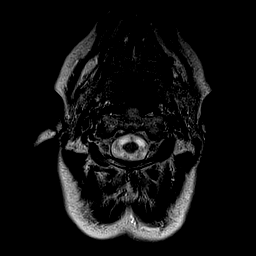

[Series 6: T1 · axial · 3.0mm · 0.35mm/px · z∈[-171,-76]mm · 8 of 26 slices shown (2 of 2)]
[im 1/26]
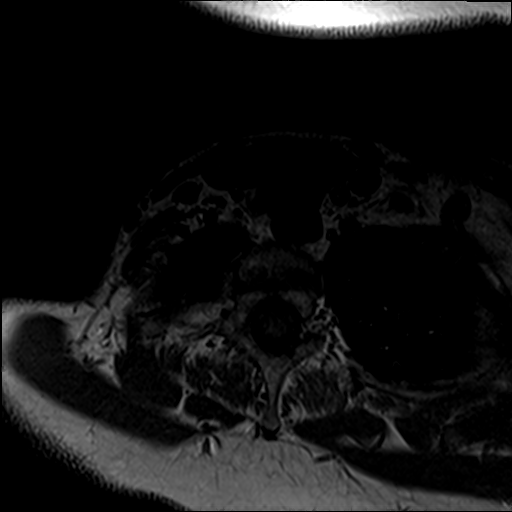
[im 4/26]
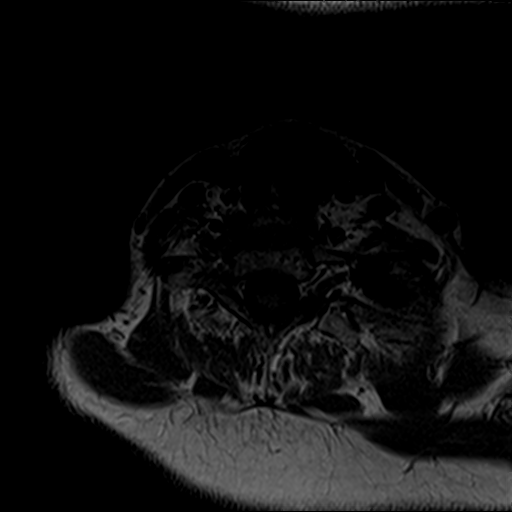
[im 8/26]
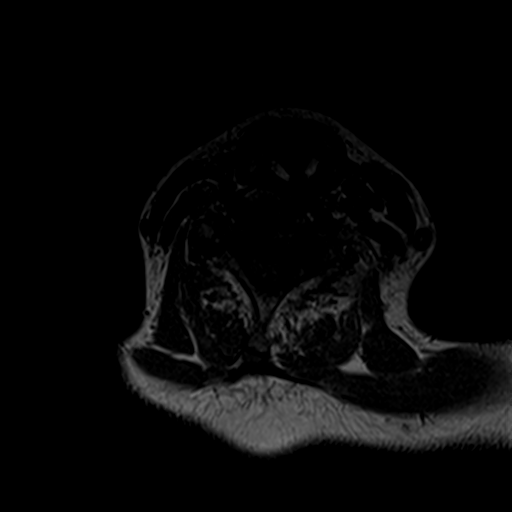
[im 11/26]
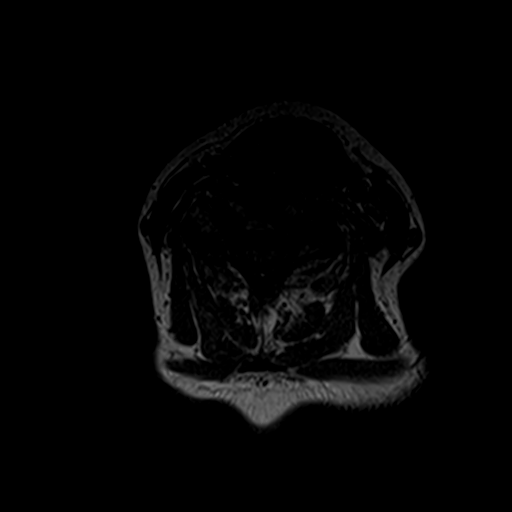
[im 15/26]
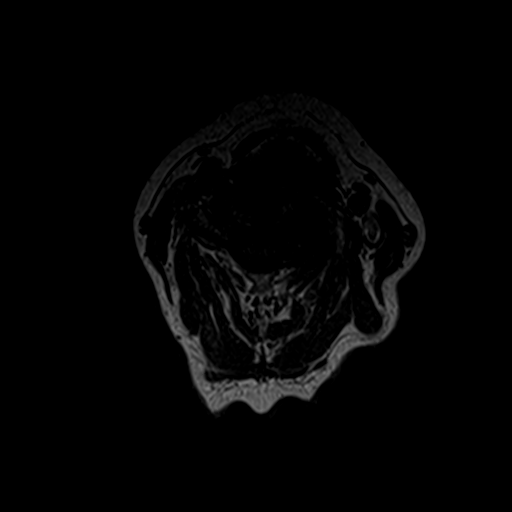
[im 18/26]
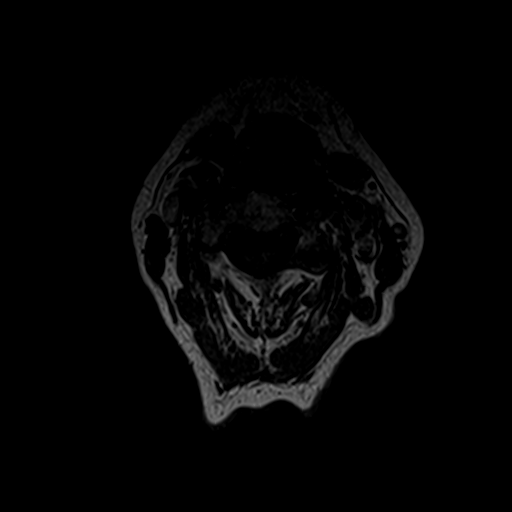
[im 22/26]
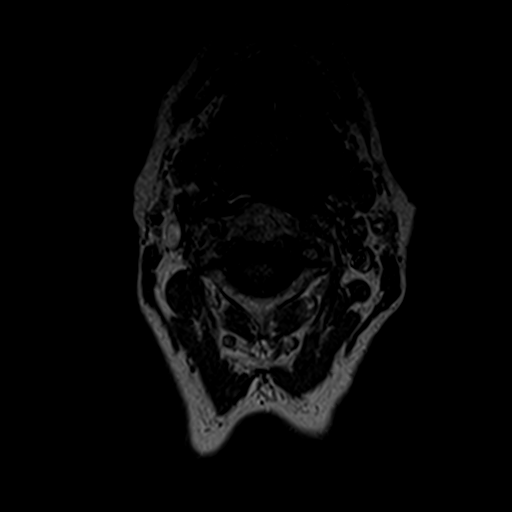
[im 26/26]
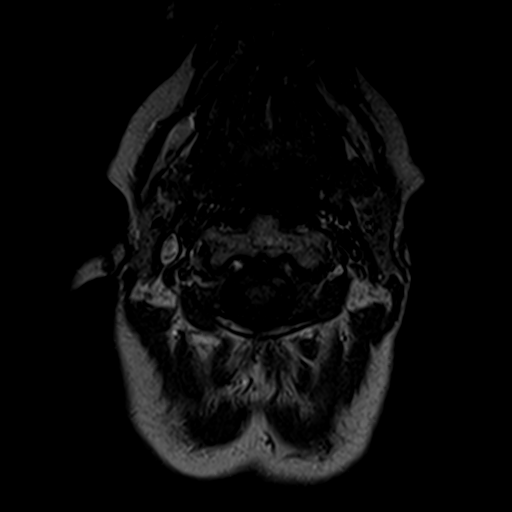

[Series 7: T2 · sagittal · 3.0mm · 0.41mm/px · 4 of 13 slices shown (2 of 2)]
[im 1/13]
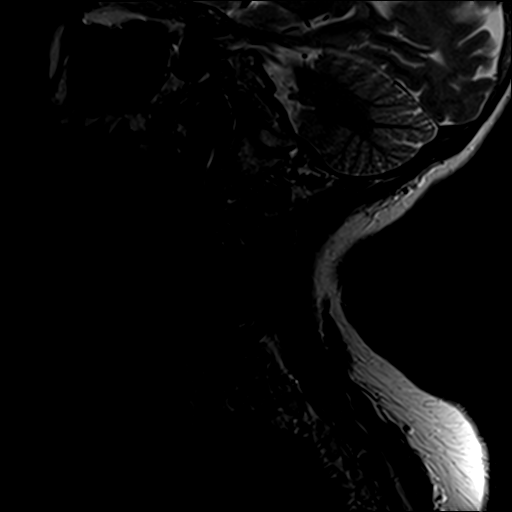
[im 5/13]
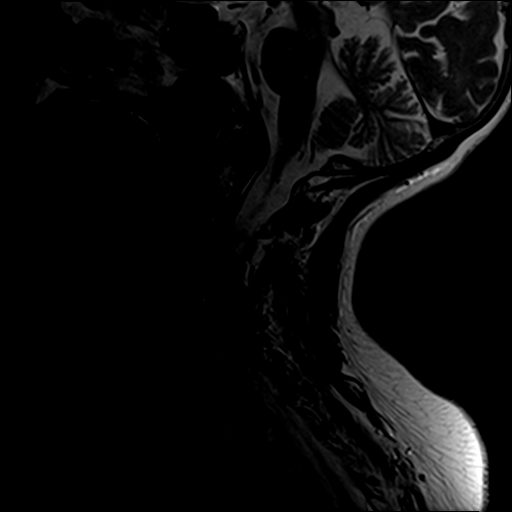
[im 9/13]
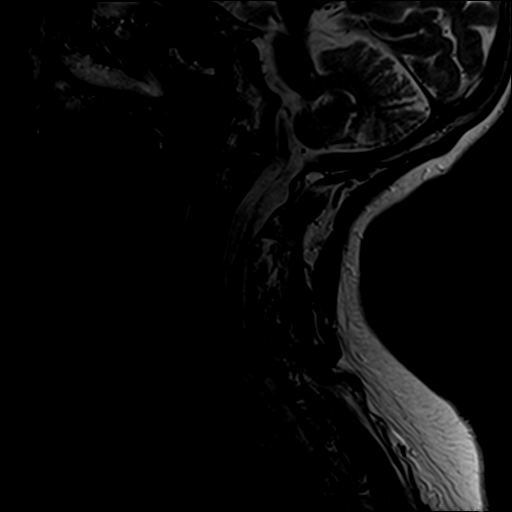
[im 13/13]
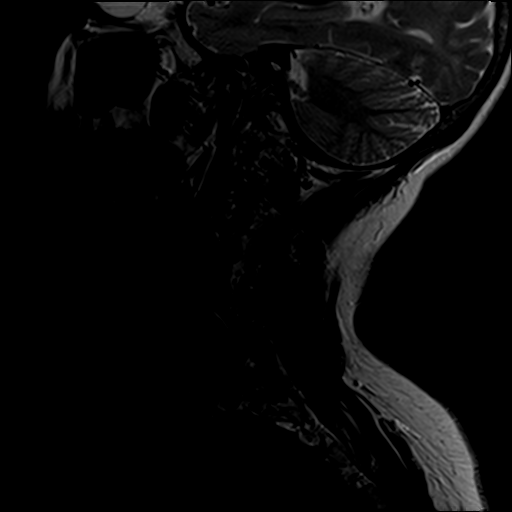

[Series 8: T1 fat-sat post-contrast · sagittal · 3.0mm · 0.82mm/px · 1 of 13 slices shown]
[im 1/13]
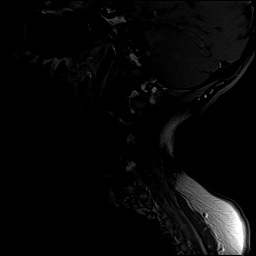

[25 of 48 positions shown; findings below may reference images not displayed]

FINDINGS: Alignment: No significant listhesis is present. Cervical lordosis is
preserved.

Vertebrae: Edematous endplate marrow changes are noted on the left
posteriorly at C5-6 and to lesser extent at C6-7. Marrow signal and
vertebral body heights are otherwise normal.

Cord: Diffuse T2 cord signal abnormalities are stable. No pathologic
enhancement is present. No new lesions are present.

Posterior Fossa, vertebral arteries, paraspinal tissues:
Craniocervical junction is normal. Flow is present in the vertebral
arteries bilaterally. Visualized intracranial contents are normal.

Disc levels:

C2-3: Negative.

C3-4: A leftward disc osteophyte complex is present. Partial
effacement of ventral CSF is noted. Severe left foraminal narrowing
is stable.

C4-5: Asymmetric left-sided uncovertebral and facet hypertrophy
contributes to moderate bilateral foraminal stenosis, left greater
than right.

C5-6: Edematous endplate marrow changes are present at the
left-sided facets. Uncovertebral spurring is worse on the right.
Moderate bilateral foraminal narrowing is worse on the right. The
central canal is patent.

C6-7: A broad-based disc osteophyte complex present. Uncovertebral
and facet hypertrophy contribute to moderate bilateral foraminal
stenosis, right greater than left.

C7-T1: Negative.
IMPRESSION: 1. Stable diffuse T2 cord signal abnormalities compatible with the
given diagnosis of multiple sclerosis. No new lesions or pathologic
enhancement.
2. Stable multilevel spondylosis of the cervical spine as described.

## 2020-06-14 MED ORDER — GADOBENATE DIMEGLUMINE 529 MG/ML IV SOLN
10.0000 mL | Freq: Once | INTRAVENOUS | Status: AC | PRN
  Administered 2020-06-14: 10 mL via INTRAVENOUS

## 2020-06-23 ENCOUNTER — Ambulatory Visit
Admission: RE | Admit: 2020-06-23 | Discharge: 2020-06-23 | Disposition: A | Discharge status: Home / Self Care | Payer: BC Managed Care – PPO | Source: Ambulatory Visit | Attending: Chiropractic Medicine | Admitting: Chiropractic Medicine

## 2020-06-23 ENCOUNTER — Other Ambulatory Visit: Payer: Self-pay

## 2020-06-23 ENCOUNTER — Other Ambulatory Visit: Payer: Self-pay | Admitting: Chiropractic Medicine

## 2020-06-23 DIAGNOSIS — M549 Dorsalgia, unspecified: Secondary | ICD-10-CM

## 2020-06-23 DIAGNOSIS — M542 Cervicalgia: Secondary | ICD-10-CM

## 2020-06-23 IMAGING — CR DG CERVICAL SPINE COMPLETE 4+V
6 series · 6 of 6 positions shown · non-contrast
Comparison: Cervical spine MRI [DATE].

CLINICAL DATA: Back pain, unspecified back pain location,
unspecified back pain laterality, unspecified chronicity. Neck pain.
Back and neck pain. Additional history provided: Patient reports
history of MS, numbness on entire right side of body.

EXAM:
CERVICAL SPINE - COMPLETE 4+ VIEW

[w cervical spine lat]
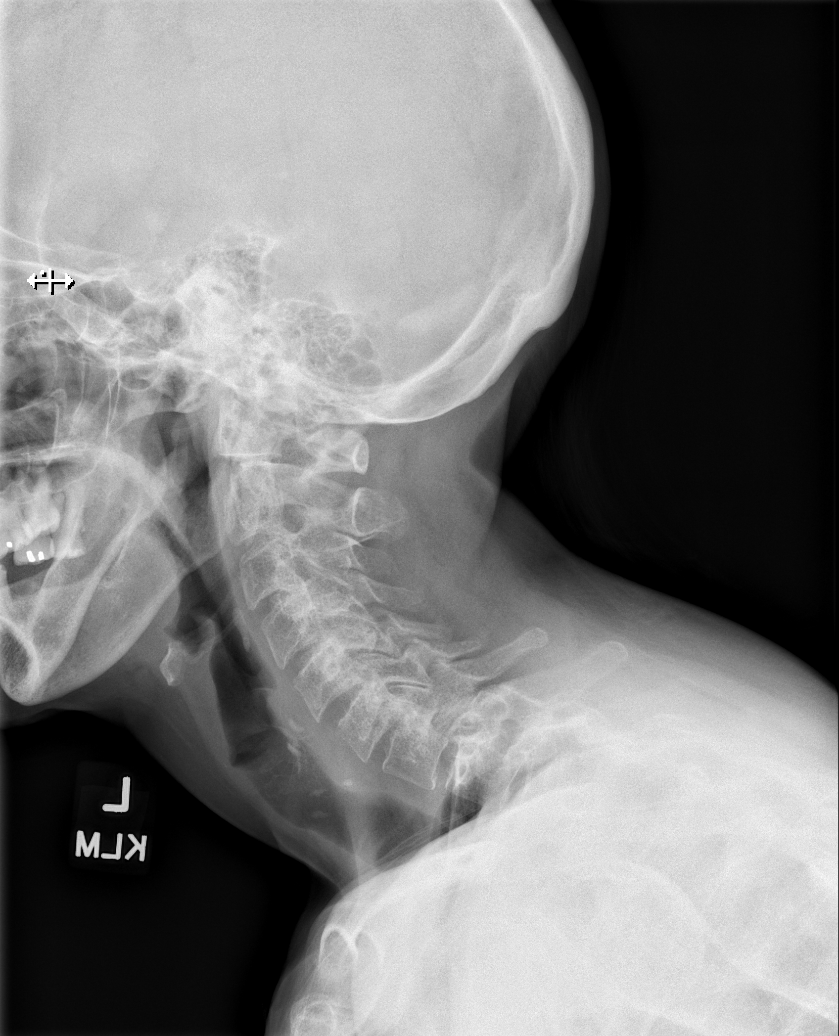

[w cervical spine ap_obl (1 of 3)]
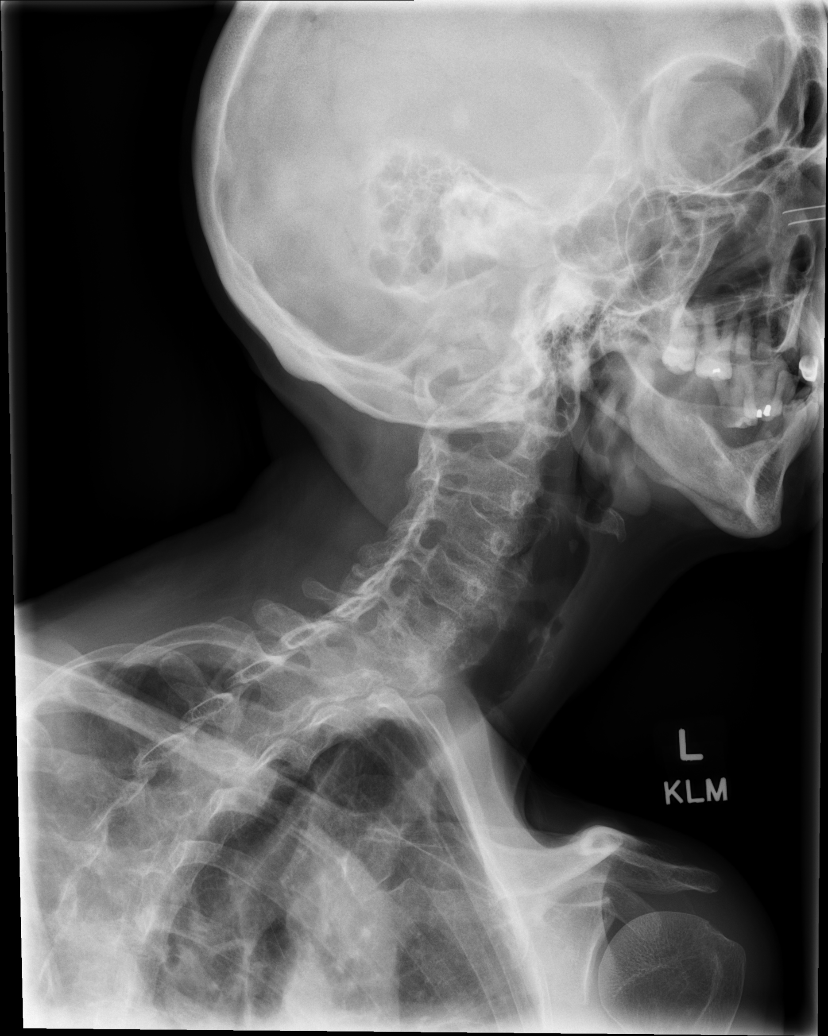

[w cervical spine ap_obl (2 of 3)]
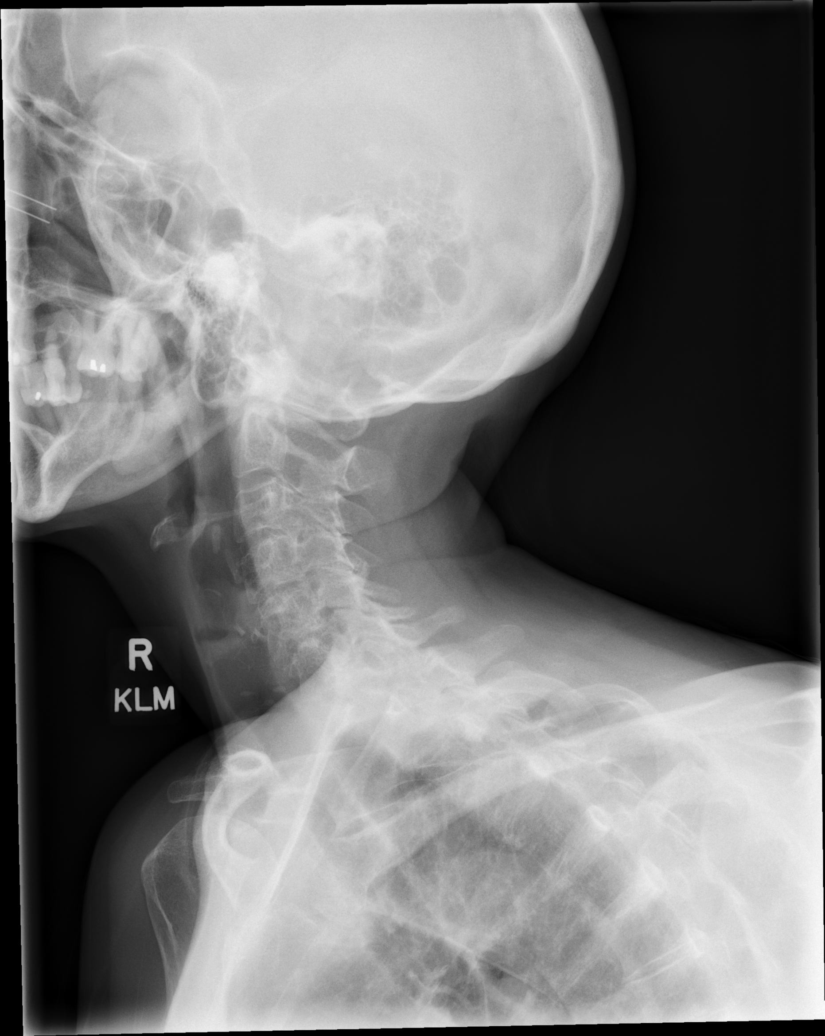

[w cervical spine ap_obl (3 of 3)]
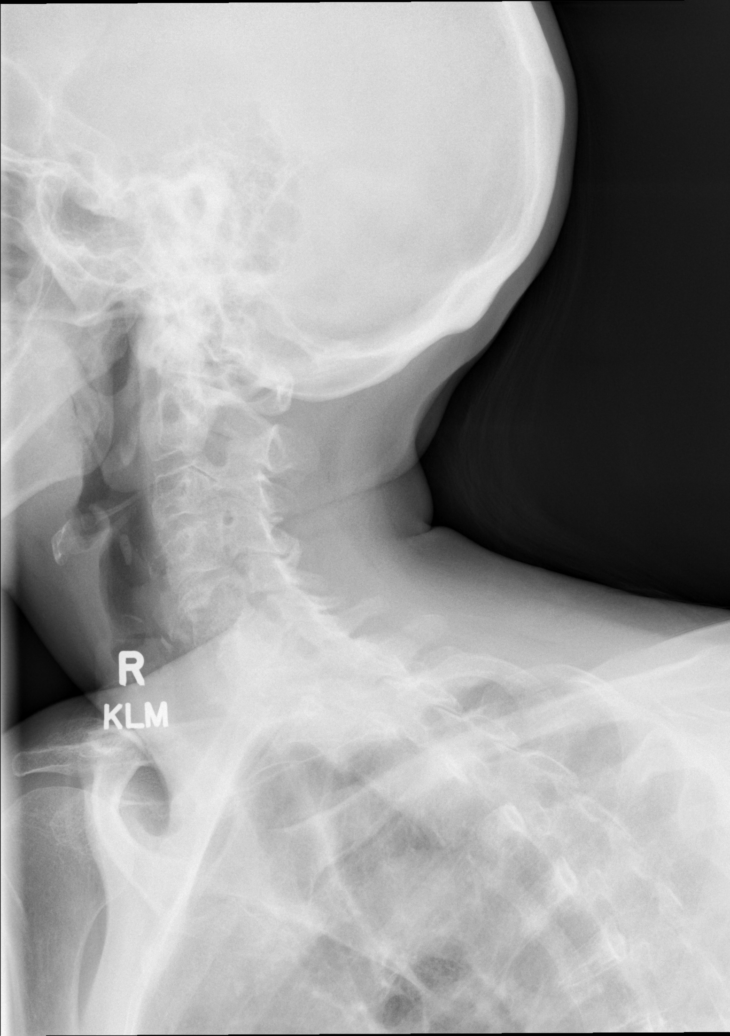

[w cervical spine ap]
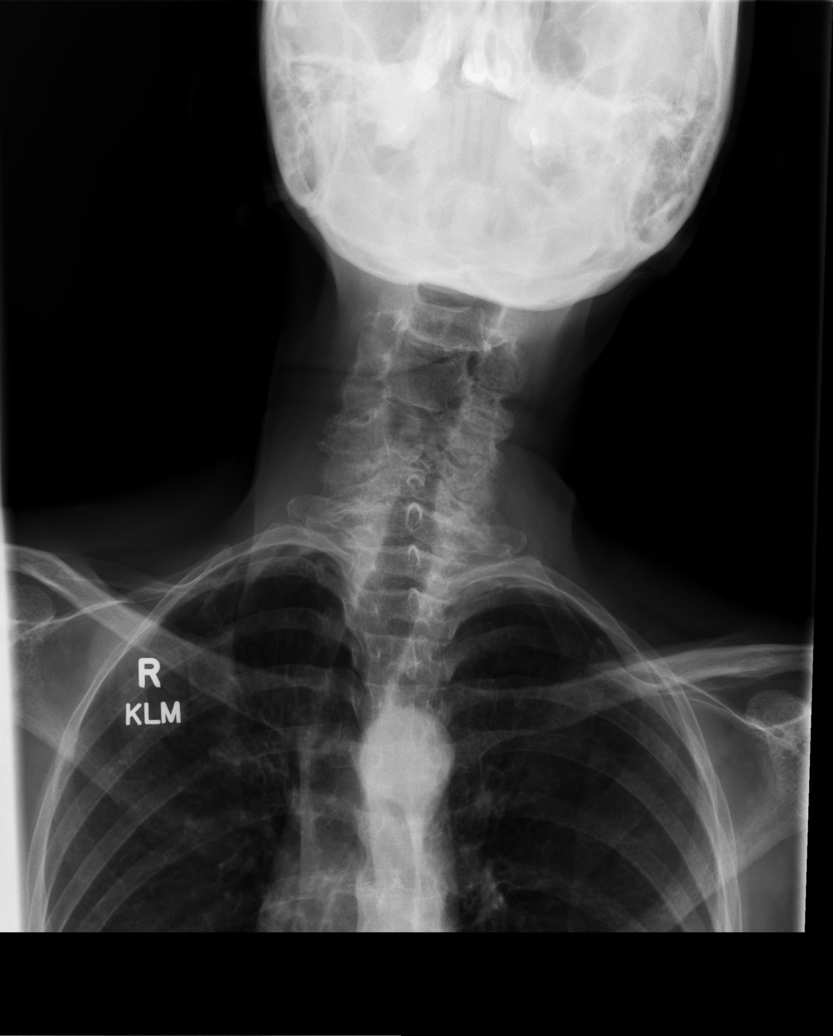

[w cervical spine odontoid]
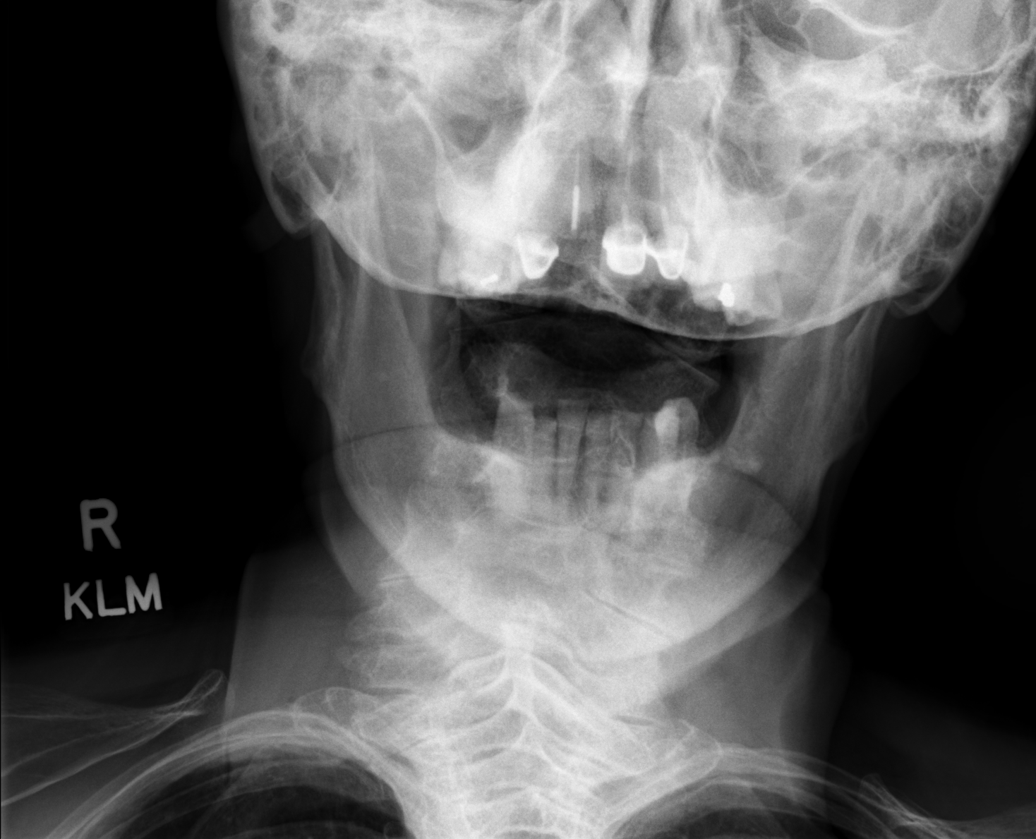

[6 of 6 positions shown; findings below may reference images not displayed]

FINDINGS: Trace C7-T1 grade 1 anterolisthesis.

Vertebral body height is maintained. No radiographic evidence of
cervical spine fracture.

Multilevel disc space narrowing, greatest at C6-C7 (mild/moderate at
this level). Multilevel endplate spurring and uncovertebral
hypertrophy, greatest at C3-C4, C5-C6 and C6-C7. Facet arthrosis.
Multilevel neural foraminal narrowing, more fully characterized on
the recent prior MRI of [DATE], and greatest on the left at
C3-C4.

Unremarkable appearance of the C1-C2 articulation on the dedicated
odontoid view, although evaluation is somewhat limited due to
patient positioning.
IMPRESSION: Cervical spondylosis, as described.

Unchanged trace C7-T1 grade 1 anterolisthesis.

## 2020-06-23 IMAGING — CR DG PELVIS 1-2V
1 series · 1 of 1 positions shown · non-contrast
Comparison: None.

CLINICAL DATA: Pelvic pain

EXAM:
PELVIS - 1 VIEW

[w pelvis upright]
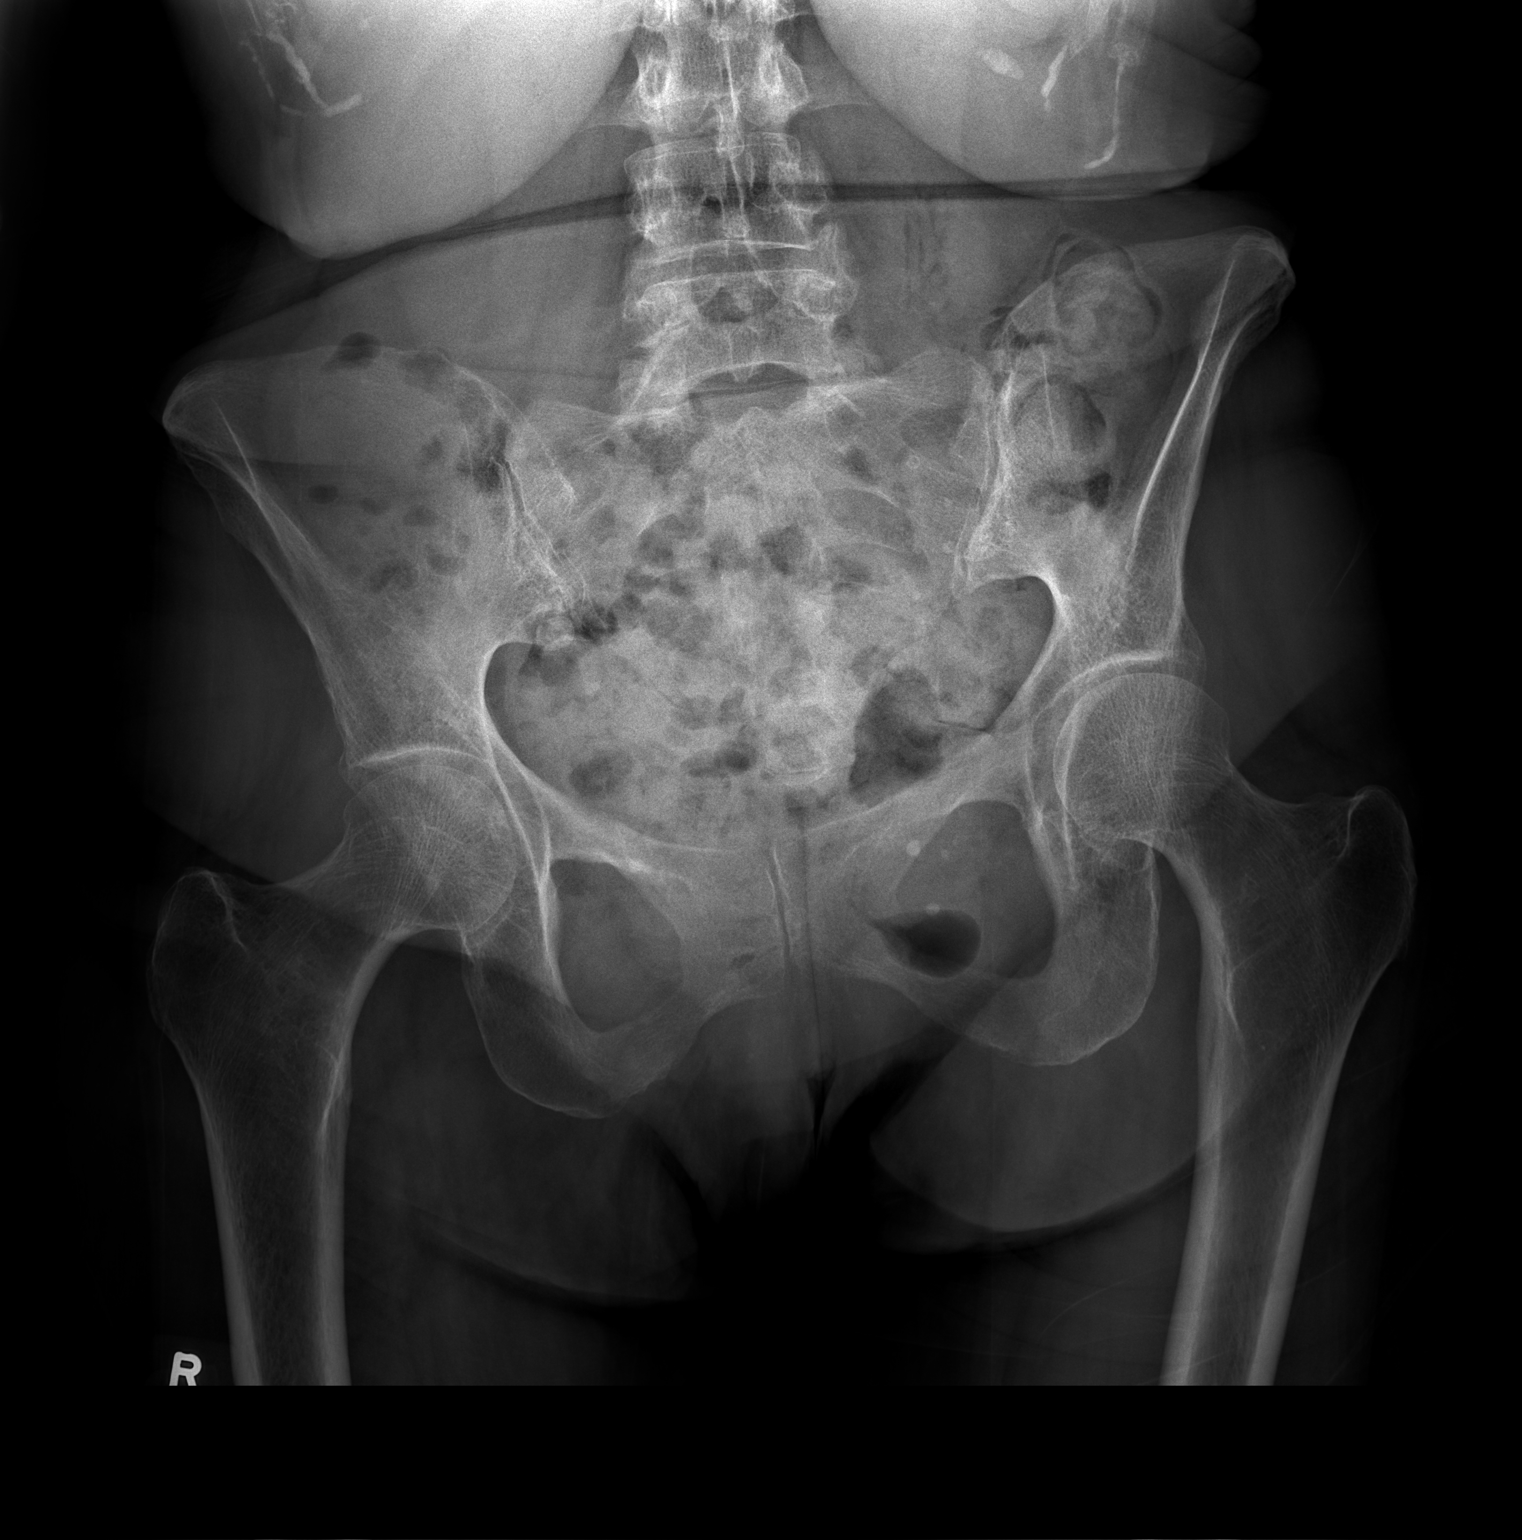

[1 of 1 positions shown; findings below may reference images not displayed]

FINDINGS: Pelvic ring is intact. No acute fracture or dislocation is noted. No
soft tissue abnormality is noted. Slight tilting of the pelvis is
noted with the left acetabulum slightly higher than the right while
standing this is of uncertain significance.
IMPRESSION: No acute bony abnormality noted.

## 2020-06-23 IMAGING — CR DG LUMBAR SPINE COMPLETE 4+V
5 series · 5 of 5 positions shown · non-contrast
Comparison: No pertinent prior exams available for comparison.

CLINICAL DATA: Back pain, unspecified back pain location,
unspecified back pain laterality, unspecified chronicity. Neck pain.
Back and neck pain. Additional history provided: Patient reports a
history of multiple sclerosis, numbness on entire right side of
body.

EXAM:
LUMBAR SPINE - COMPLETE 4+ VIEW

[w lumbar spine lat]
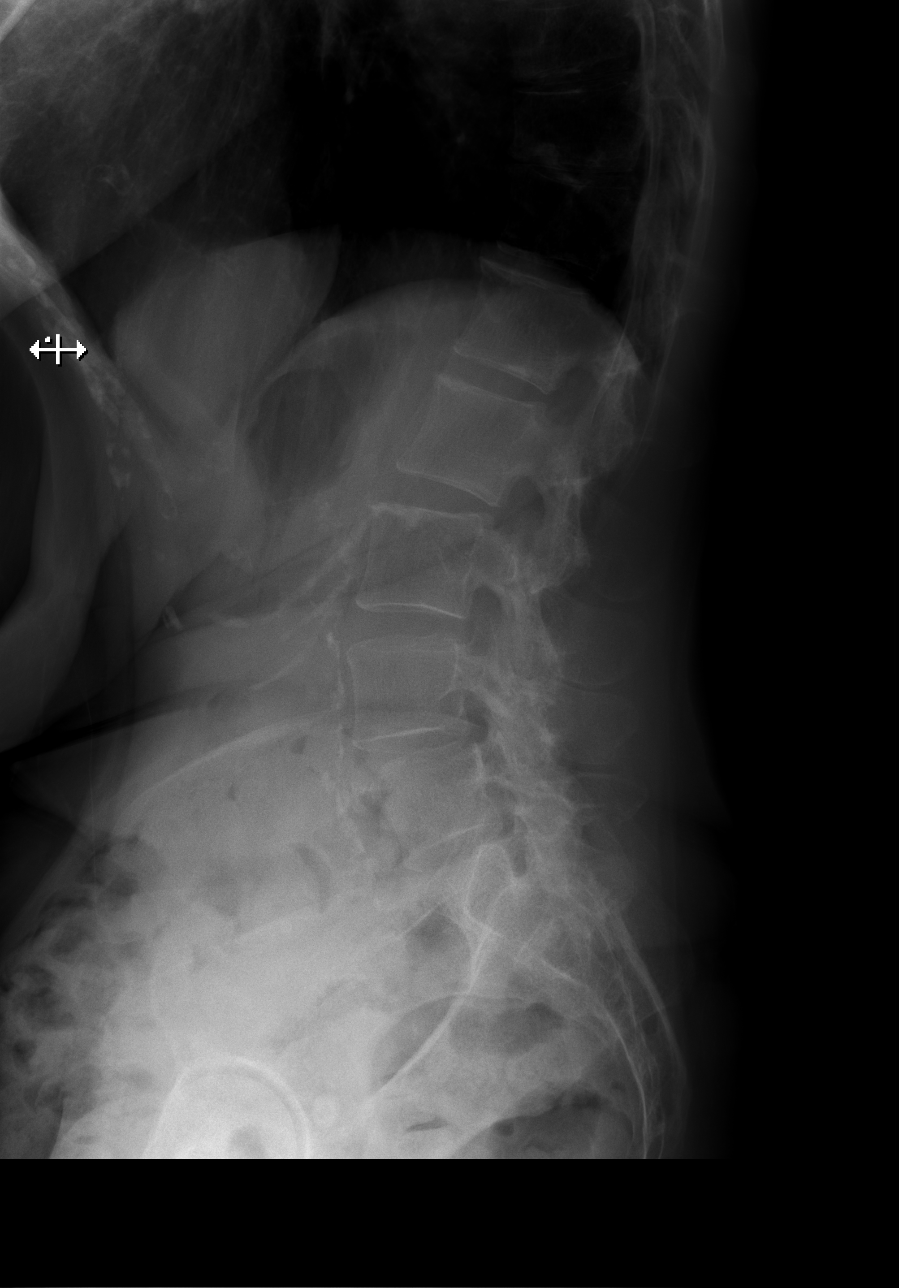

[w lumbar l-5 s-1 spot]
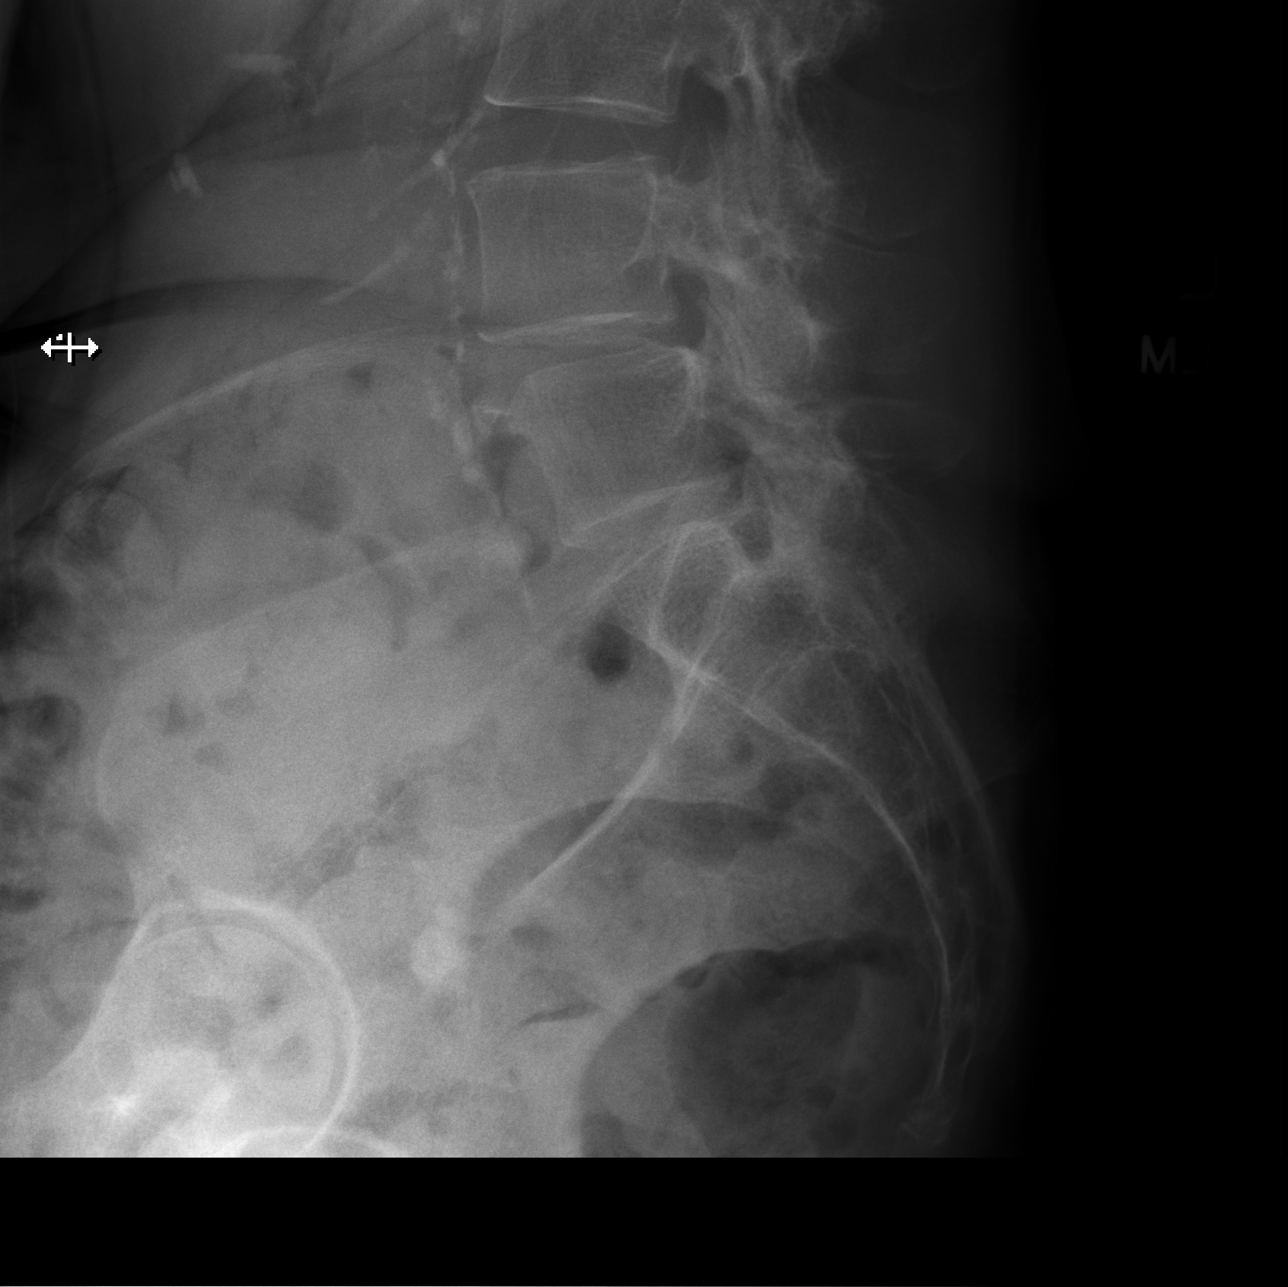

[w lumbar spine ap]
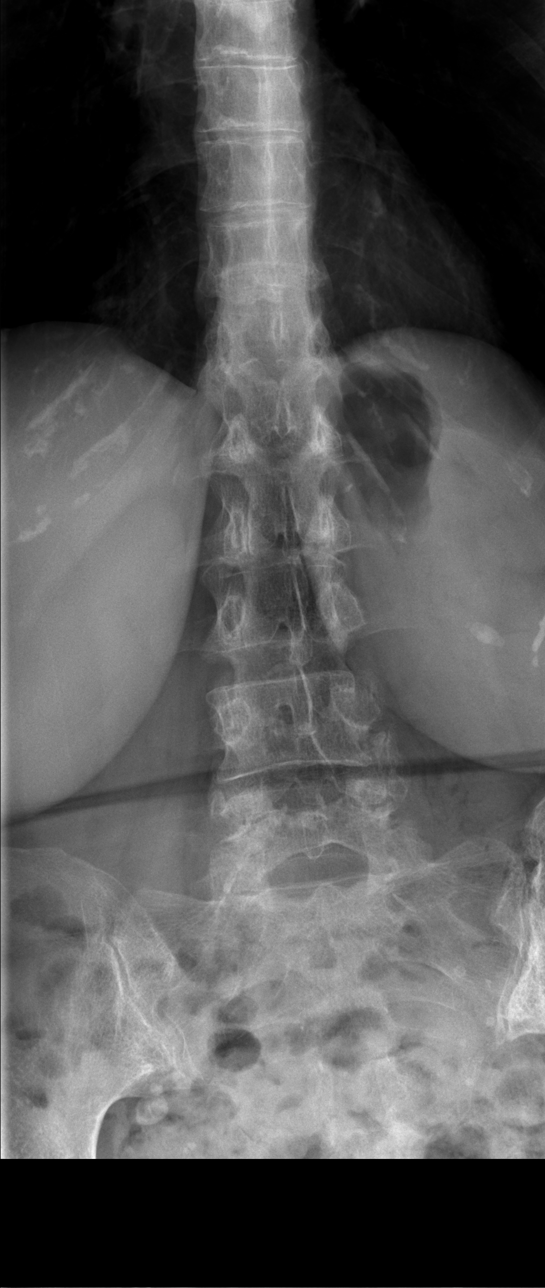

[w lumbar spine obl (1 of 2)]
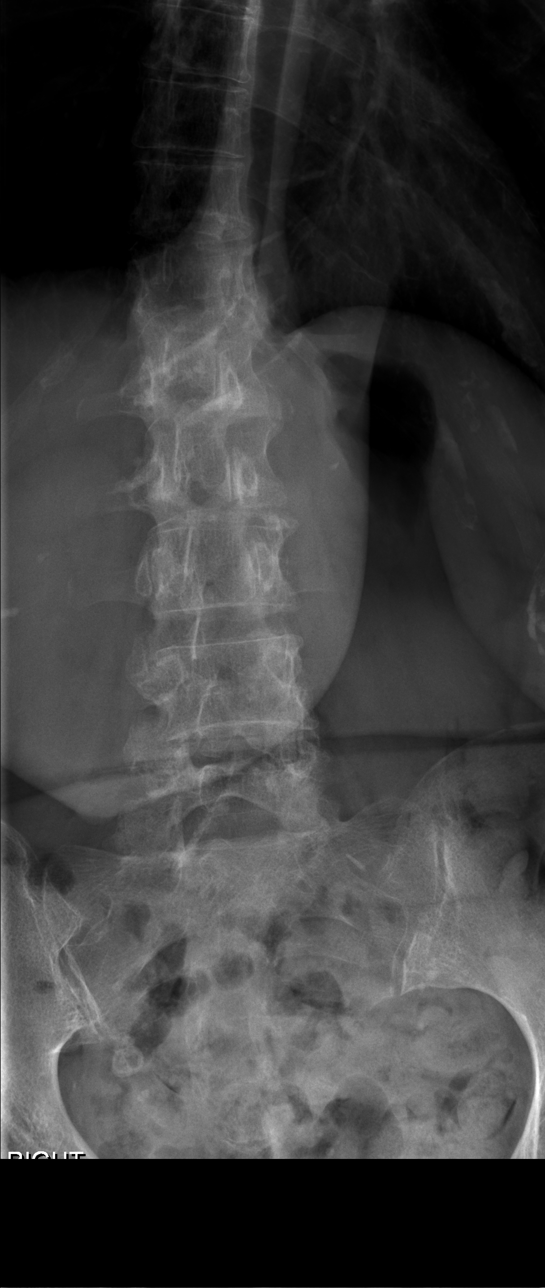

[w lumbar spine obl (2 of 2)]
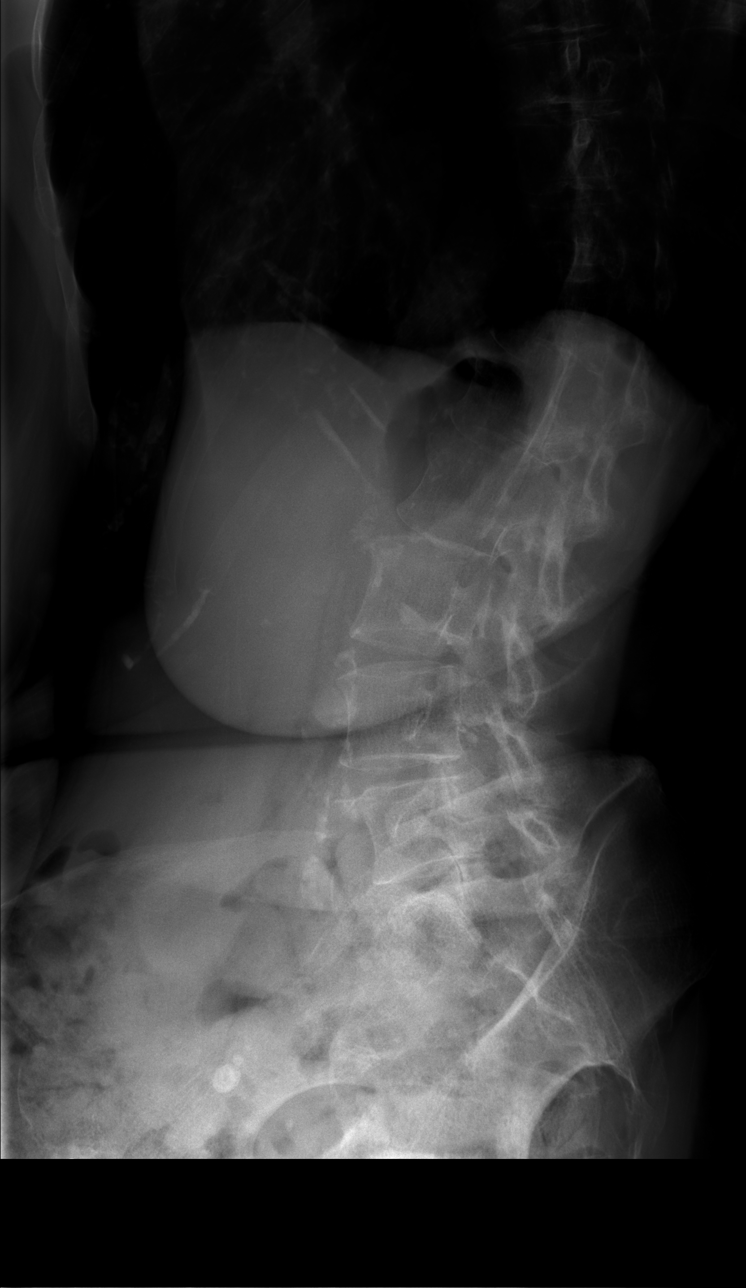

[5 of 5 positions shown; findings below may reference images not displayed]

FINDINGS: Five lumbar vertebrae.

Mild L4-L5 grade 1 anterolisthesis. Trace L5-S1 grade 1
retrolisthesis.

No lumbar vertebral compression fracture.

Multilevel disc space narrowing. Most notably, mild-to-moderate disc
space narrowing is present at L4-L5. Multilevel facet arthrosis
greatest at L4-L5 and L5-S1.

Aortic atherosclerosis.
IMPRESSION: No lumbar vertebral compression fracture.

Lumbar spondylosis, as described.

Mild L4-L5 grade 1 anterolisthesis at L5-S1 grade 1 retrolisthesis.

Aortic Atherosclerosis ([YM]-[YM]).

## 2020-06-23 IMAGING — CR DG THORACIC SPINE 3V
4 series · 4 of 4 positions shown · non-contrast
Comparison: No pertinent prior exams available for comparison.

CLINICAL DATA: Back pain, unspecified back pain location,
unspecified back pain laterality, unspecified chronicity. Neck pain.
Back and neck pain. Additional history provided: Patient reports a
history of multiple sclerosis, numbness on entire right-sided body.

EXAM:
THORACIC SPINE - 3 VIEWS

[w thoracic spine ap]
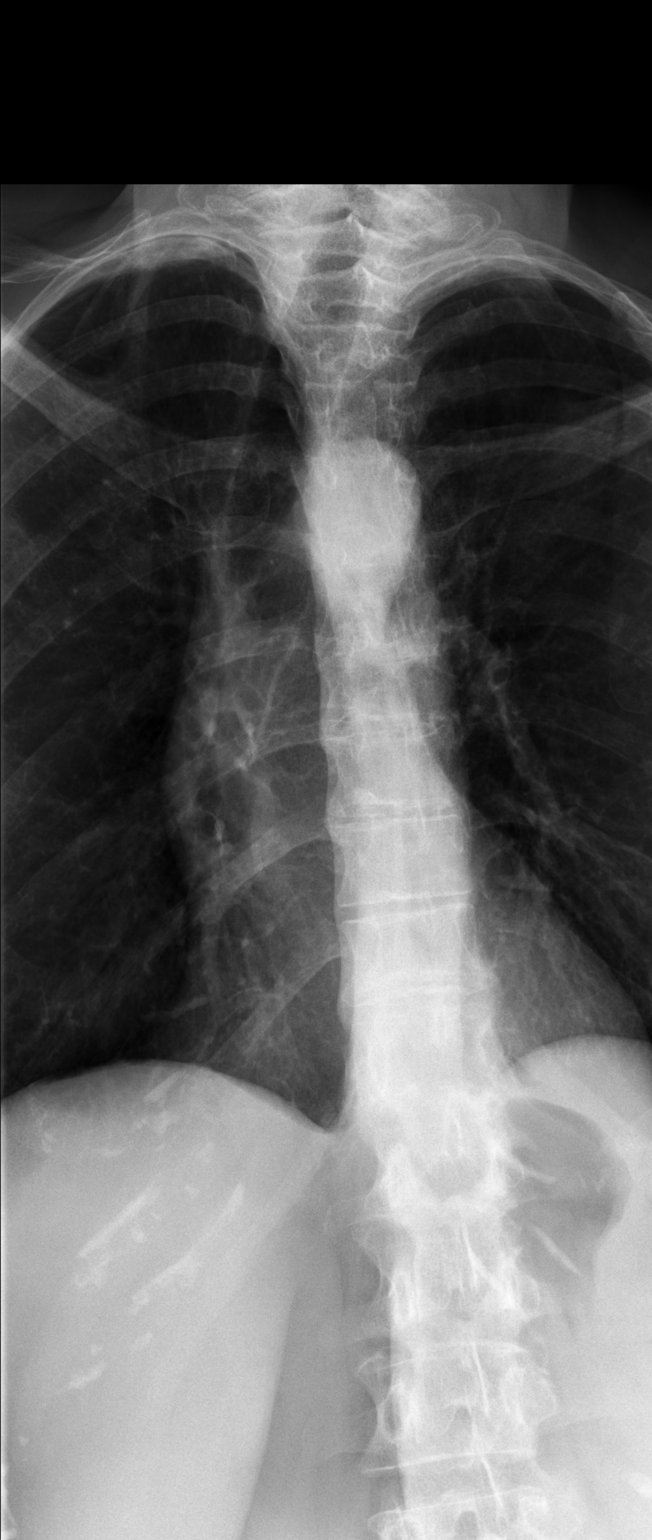

[w thoracic spine lat (1 of 2)]
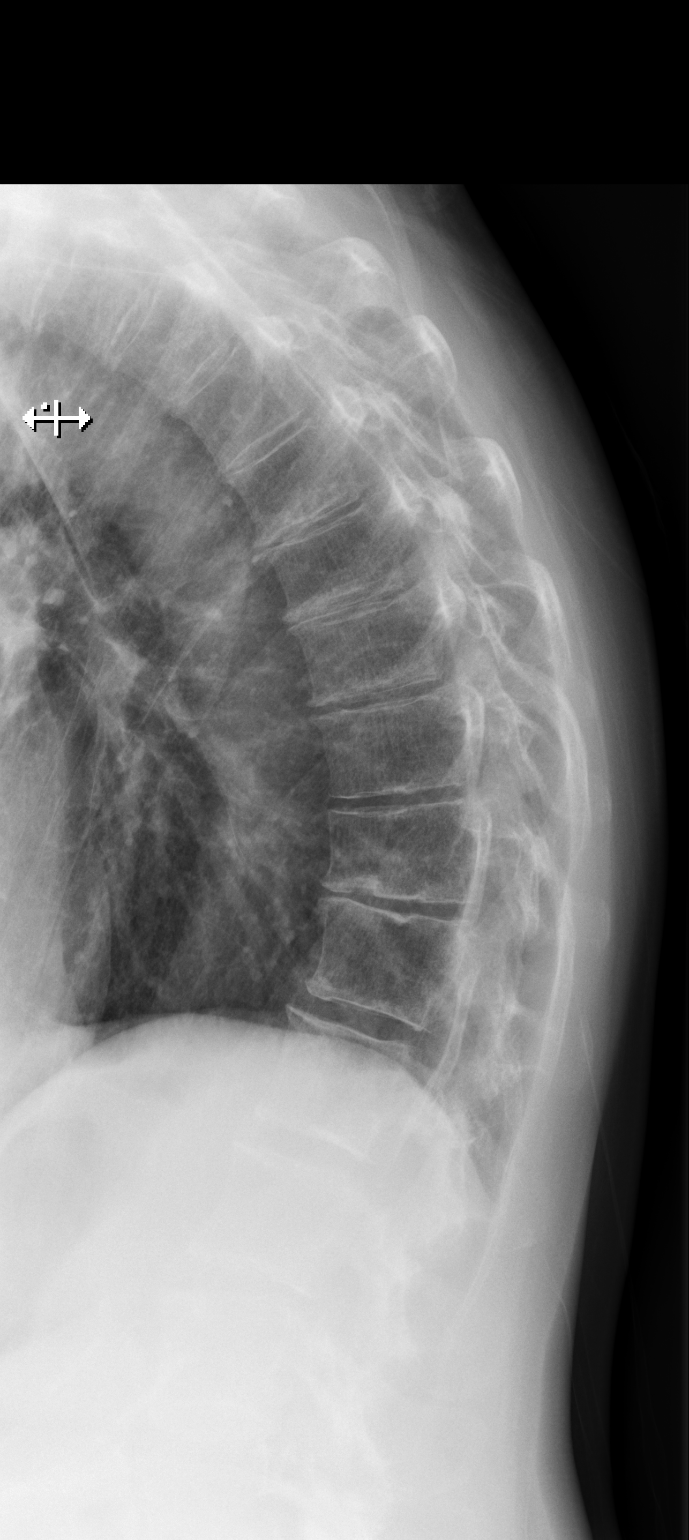

[w thoracic spine lat (2 of 2)]
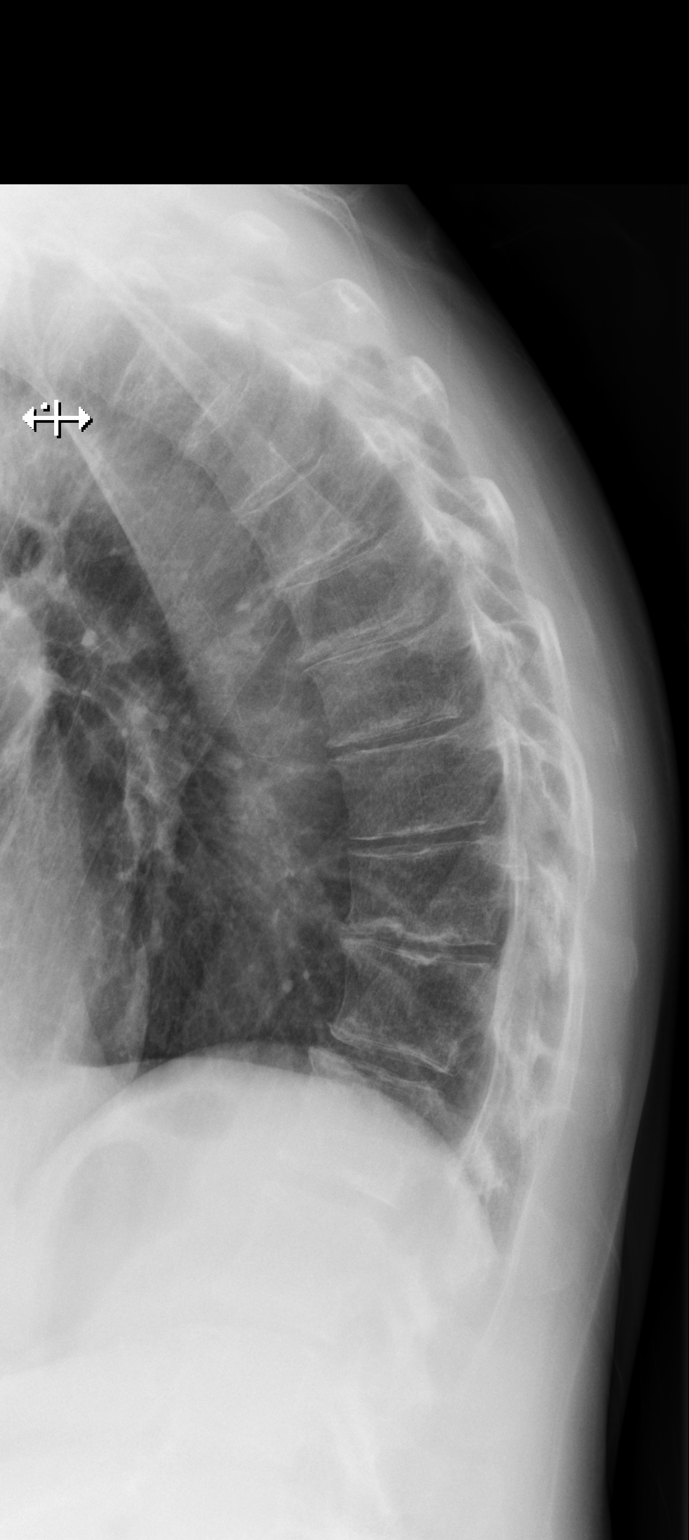

[w thoracic swimmers]
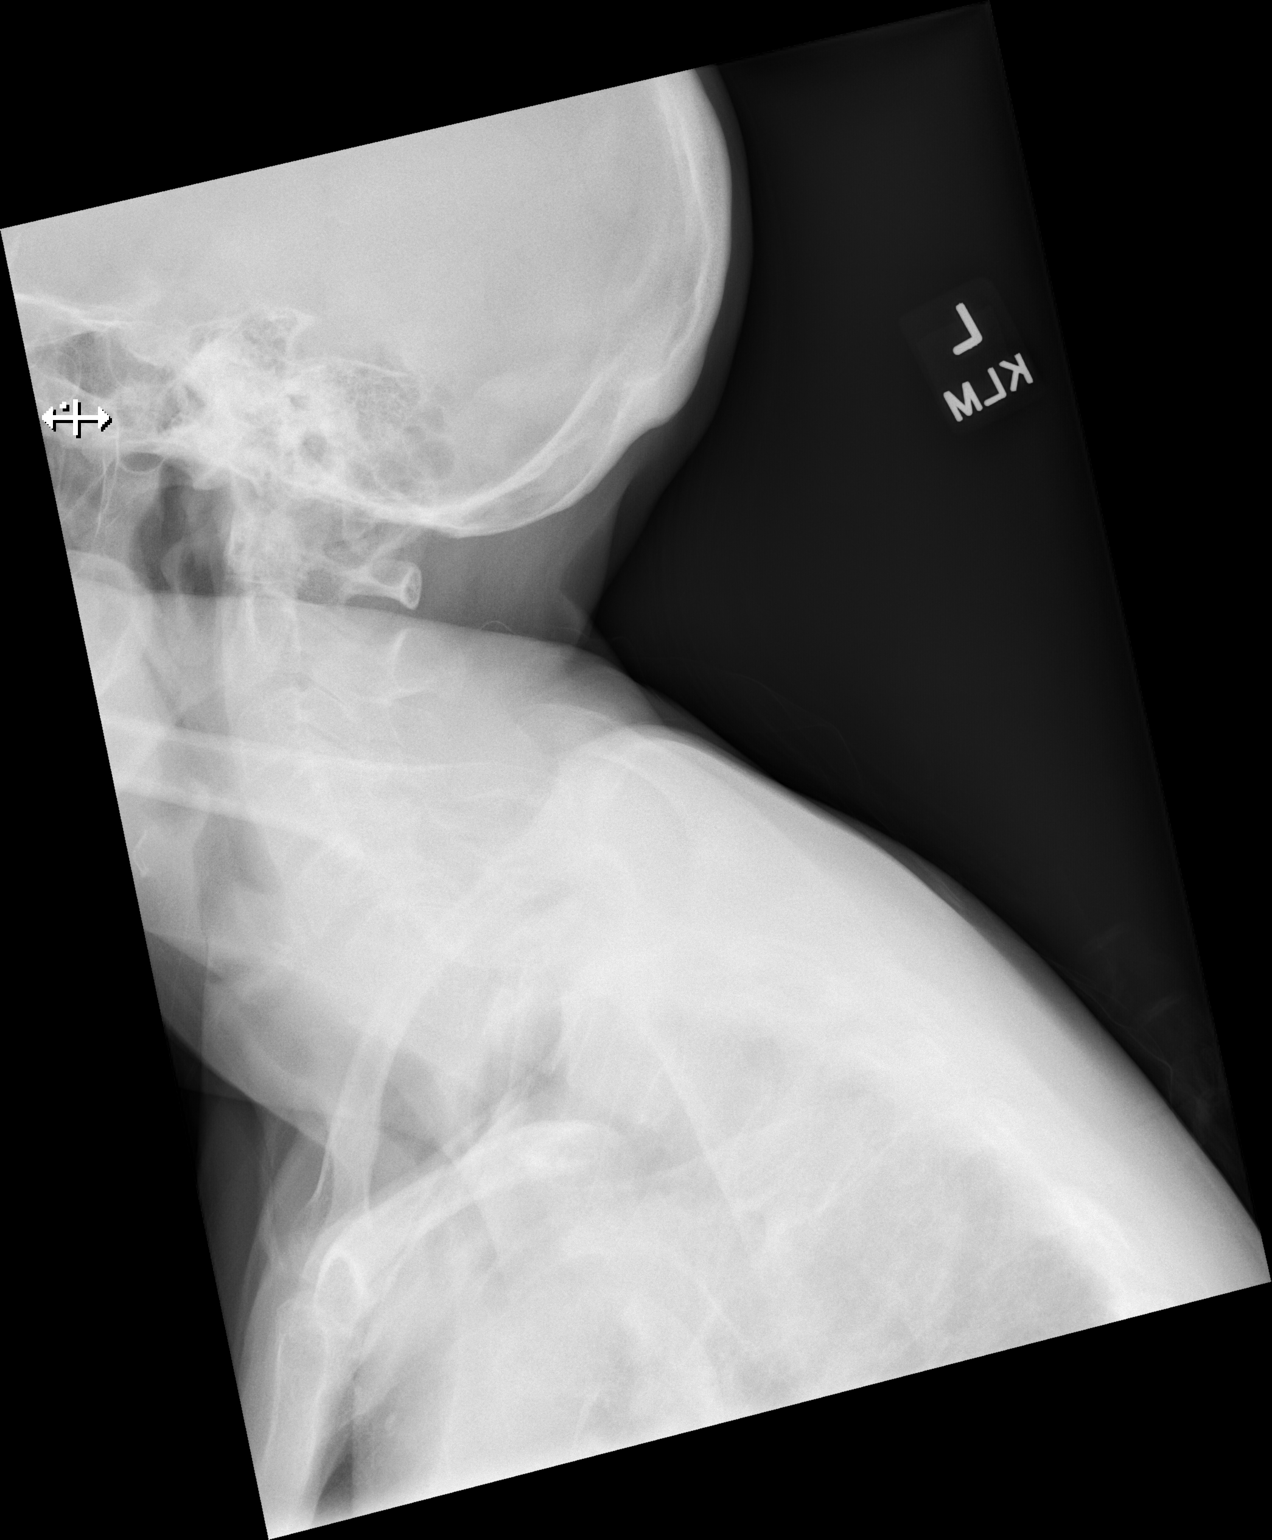

[4 of 4 positions shown; findings below may reference images not displayed]

FINDINGS: Mild thoracic levocurvature.  Exaggerated thoracic kyphosis.

No appreciable thoracic vertebral compression fracture is
identified. Mild multilevel degenerative endplate irregularity.

Mild-to-moderate disc space narrowing throughout the thoracic spine.
IMPRESSION: No appreciable thoracic vertebral compression fracture.

Thoracic spondylosis, as described.

Exaggerated thoracic kyphosis.

Mild thoracic levocurvature.

## 2020-06-25 ENCOUNTER — Telehealth: Payer: Self-pay | Admitting: Neurology

## 2020-06-25 NOTE — Telephone Encounter (Signed)
For: Banks 06/25/20 [phone] and submitted prior auth request verbally with Riki Rusk [phone]     Waiting on response

## 2020-06-26 ENCOUNTER — Telehealth: Payer: Self-pay

## 2020-06-26 ENCOUNTER — Other Ambulatory Visit: Payer: Self-pay | Admitting: Pharmacy Technician

## 2020-06-26 NOTE — Telephone Encounter (Signed)
Pt called in and her insurance will allow her to get her infusion ocrevus at [facility].   Per Maudie Mercury at the infusion center. She will do the insurance portion just send over the order.

## 2020-06-27 ENCOUNTER — Telehealth: Payer: Self-pay | Admitting: Neurology

## 2020-06-27 ENCOUNTER — Telehealth: Payer: Self-pay | Admitting: Pharmacy Technician

## 2020-06-27 NOTE — Telephone Encounter (Signed)
Auth Submission: Payer: [employer] Medication & CPT/J Code(s) submitted: Ocrevus Hoyt Koch) [phone] Route of submission (phone, fax, portal): Cover my meds portal Auth type: Buy/Bill Units/visits requested: 1200 units (600mg  q79months x2)   Reference number: [id number] (Cover my meds KEY)   Will update once we receive a response.

## 2020-06-27 NOTE — Telephone Encounter (Signed)
  Received notice of approval letter from [facility]  06/25/2020 - as long as coverage by [place]  Approved  Effective 06/25/2020 - 06/25/2021  Auth letter sent to scan

## 2020-06-30 NOTE — Telephone Encounter (Signed)
Auth Follow-up: APPROVED Payer: [employer] Source of follow up: portal/phone/fax: Phone and portal  Phone #:[phone] Rep Name: n/a Reference number: [id number] Medication & CPT/J Code(s) De Nurse Hoyt Koch) Q1901 Status: APPROVED Approval from: 06/27/20 to 06/26/21 at [facility] as Buy/Bill

## 2020-07-15 ENCOUNTER — Encounter: Payer: BC Managed Care – PPO | Admitting: Family

## 2020-07-20 ENCOUNTER — Encounter: Payer: Self-pay | Admitting: Family

## 2020-07-21 ENCOUNTER — Telehealth: Payer: Self-pay | Admitting: Family

## 2020-07-21 NOTE — Telephone Encounter (Signed)
Per Jessica Salazar. Note being forward at patient request. She called in however she stated she was unable to come in for a urine sample and states that meds are noramlly just sent over for her. She stated if she cant get meds sent over without a urine sample she would just suffer until her 08/05 appt due to her not driving transportation is an issue. Please advise

## 2020-07-22 ENCOUNTER — Other Ambulatory Visit: Payer: Self-pay | Admitting: Family

## 2020-07-22 MED ORDER — NITROFURANTOIN MONOHYD MACRO 100 MG PO CAPS: 100.0000 mg | ORAL_CAPSULE | Freq: Two times a day (BID) | ORAL | 1 refills | Status: DC

## 2020-07-24 ENCOUNTER — Other Ambulatory Visit: Payer: Self-pay

## 2020-07-24 ENCOUNTER — Ambulatory Visit (INDEPENDENT_AMBULATORY_CARE_PROVIDER_SITE_OTHER): Payer: BC Managed Care – PPO

## 2020-07-24 VITALS — BP 99/66 | HR 83 | Temp 97.5°F | Resp 18

## 2020-07-24 DIAGNOSIS — G35 Multiple sclerosis: Secondary | ICD-10-CM | POA: Diagnosis not present

## 2020-07-24 MED ORDER — DIPHENHYDRAMINE HCL 25 MG PO CAPS
50.0000 mg | ORAL_CAPSULE | Freq: Once | ORAL | Status: AC
Start: 2020-07-24 — End: 2020-07-24
  Administered 2020-07-24: 50 mg via ORAL
  Filled 2020-07-24: qty 2

## 2020-07-24 MED ORDER — ACETAMINOPHEN 325 MG PO TABS
650.0000 mg | ORAL_TABLET | Freq: Once | ORAL | Status: AC
  Administered 2020-07-24: 650 mg via ORAL
  Filled 2020-07-24: qty 2

## 2020-07-24 MED ORDER — SODIUM CHLORIDE 0.9 % IV SOLN
600.0000 mg | Freq: Once | INTRAVENOUS | Status: AC
  Administered 2020-07-24: 600 mg via INTRAVENOUS
  Filled 2020-07-24: qty 20

## 2020-07-24 MED ORDER — METHYLPREDNISOLONE SODIUM SUCC 125 MG IJ SOLR
125.0000 mg | Freq: Once | INTRAMUSCULAR | Status: AC
  Administered 2020-07-24: 125 mg via INTRAVENOUS
  Filled 2020-07-24: qty 2

## 2020-07-24 NOTE — Progress Notes (Signed)
Diagnosis: Multiple Sclerosis  Provider:  Marshell Garfinkel, MD  Procedure: Infusion  IV Type: Peripheral, IV Location: R Antecubital  Ocrevus (Ocrelizumab), Dose: 600mg   Infusion Start Time: 0905  Infusion Stop Time: 1300  Post Infusion IV Care: Observation period completed  Discharge: Condition: Good, Destination: Home . AVS provided to patient.   Performed by:  Paul Dykes, RN

## 2020-07-28 ENCOUNTER — Telehealth: Payer: Self-pay

## 2020-07-28 MED ORDER — PREDNISONE 50 MG PO TABS: ORAL_TABLET | ORAL | 0 refills | Status: DC

## 2020-07-28 NOTE — Telephone Encounter (Signed)
Per Dr.Jaffe, We can prescribe her a high-dose prednisone taper - 50mg  tablet:  10 tablets daily for 3 days, then 8 tablets on day 4, then 6 tablets on day 5, then 4 tablets on day 6, then 2 tablets on day 7, then 1 tablet on day 8, then1/2 tablet on day 9, the STOP.  Quantity 52, Refills 0.   Script sent to [facility].

## 2020-08-15 ENCOUNTER — Ambulatory Visit (INDEPENDENT_AMBULATORY_CARE_PROVIDER_SITE_OTHER): Payer: BC Managed Care – PPO | Admitting: Family

## 2020-08-15 ENCOUNTER — Inpatient Hospital Stay (HOSPITAL_COMMUNITY): Admit: 2020-08-15 | Payer: Self-pay

## 2020-08-15 ENCOUNTER — Other Ambulatory Visit (HOSPITAL_COMMUNITY)
Admission: RE | Admit: 2020-08-15 | Discharge: 2020-08-15 | Disposition: A | Discharge status: Home / Self Care | Payer: BC Managed Care – PPO | Source: Ambulatory Visit | Attending: Family | Admitting: Family

## 2020-08-15 ENCOUNTER — Encounter: Payer: Self-pay | Admitting: Family

## 2020-08-15 ENCOUNTER — Other Ambulatory Visit: Payer: Self-pay

## 2020-08-15 VITALS — BP 90/60 | HR 61 | Temp 98.1°F | Ht 64.0 in | Wt 106.2 lb

## 2020-08-15 DIAGNOSIS — Z1231 Encounter for screening mammogram for malignant neoplasm of breast: Secondary | ICD-10-CM

## 2020-08-15 DIAGNOSIS — Z13220 Encounter for screening for lipoid disorders: Secondary | ICD-10-CM

## 2020-08-15 DIAGNOSIS — Z0000 Encounter for general adult medical examination without abnormal findings: Secondary | ICD-10-CM | POA: Diagnosis not present

## 2020-08-15 DIAGNOSIS — Z124 Encounter for screening for malignant neoplasm of cervix: Secondary | ICD-10-CM

## 2020-08-15 LAB — CBC WITH DIFFERENTIAL/PLATELET
Basophils Absolute: 0 10*3/uL (ref 0.0–0.1)
Basophils Relative: 0.5 % (ref 0.0–3.0)
Eosinophils Absolute: 0.1 10*3/uL (ref 0.0–0.7)
Eosinophils Relative: 2 % (ref 0.0–5.0)
HCT: 39.2 % (ref 36.0–46.0)
Hemoglobin: 13.2 g/dL (ref 12.0–15.0)
Lymphocytes Relative: 19.4 % (ref 12.0–46.0)
Lymphs Abs: 1.3 10*3/uL (ref 0.7–4.0)
MCHC: 33.7 g/dL (ref 30.0–36.0)
MCV: 90 fl (ref 78.0–100.0)
Monocytes Absolute: 0.8 10*3/uL (ref 0.1–1.0)
Monocytes Relative: 11.9 % (ref 3.0–12.0)
Neutro Abs: 4.5 10*3/uL (ref 1.4–7.7)
Neutrophils Relative %: 66.2 % (ref 43.0–77.0)
Platelets: 218 10*3/uL (ref 150.0–400.0)
RBC: 4.35 Mil/uL (ref 3.87–5.11)
RDW: 14 % (ref 11.5–15.5)
WBC: 6.8 10*3/uL (ref 4.0–10.5)

## 2020-08-15 LAB — COMPREHENSIVE METABOLIC PANEL
ALT: 12 U/L (ref 0–35)
AST: 17 U/L (ref 0–37)
Albumin: 4.3 g/dL (ref 3.5–5.2)
Alkaline Phosphatase: 59 U/L (ref 39–117)
BUN: 15 mg/dL (ref 6–23)
CO2: 32 mEq/L (ref 19–32)
Calcium: 9.3 mg/dL (ref 8.4–10.5)
Chloride: 103 mEq/L (ref 96–112)
Creatinine, Ser: 0.53 mg/dL (ref 0.40–1.20)
GFR: 102.81 mL/min (ref >60.00)
Glucose, Bld: 80 mg/dL (ref 70–99)
Potassium: 4.3 mEq/L (ref 3.5–5.1)
Sodium: 141 mEq/L (ref 135–145)
Total Bilirubin: 1.4 mg/dL — ABNORMAL HIGH (ref 0.2–1.2)
Total Protein: 6.6 g/dL (ref 6.0–8.3)

## 2020-08-15 LAB — LIPID PANEL
Cholesterol: 220 mg/dL — ABNORMAL HIGH (ref 0–200)
HDL: 82.7 mg/dL (ref >39.00)
LDL Cholesterol: 123 mg/dL — ABNORMAL HIGH (ref 0–99)
NonHDL: 137.3
Total CHOL/HDL Ratio: 3
Triglycerides: 73 mg/dL (ref 0.0–149.0)
VLDL: 14.6 mg/dL (ref 0.0–40.0)

## 2020-08-15 LAB — TSH: TSH: 1.02 u[IU]/mL (ref 0.35–5.50)

## 2020-08-15 NOTE — Progress Notes (Signed)
Jessica Salazar is a 55 y.o. female with the following history as recorded in EpicCare:  Patient Active Problem List   Diagnosis Date Noted   Dysesthesia 06/04/2016   Urinary frequency 06/04/2016   Insomnia 06/04/2016   Greater trochanteric bursitis of right hip 06/10/2015   Gait abnormality 12/30/2014   Multiple sclerosis (HCC) 09/25/2010    Current Outpatient Medications  Medication Sig Dispense Refill   Cholecalciferol (VITAMIN D) 50 MCG (2000 UT) CAPS 3,000 Units.     Multiple Vitamins-Minerals (MULTIVITAMIN GUMMIES ADULT PO) Take by mouth. Olly gummy multivitamin 2 po daily     nitrofurantoin, macrocrystal-monohydrate, (MACROBID) 100 MG capsule Take 1 capsule (100 mg total) by mouth 2 (two) times daily. 14 capsule 1   Ocrelizumab (OCREVUS IV) Inject into the vein. Infusion 2 times a year     No current facility-administered medications for this visit.    Allergies: Patient has no known allergies.  Past Medical History:  Diagnosis Date   Arthritis    Constipation    due to MS   Heart murmur    MVP   History of recurrent UTIs    Hypotension    Mitral valve prolapse    Multiple sclerosis (HCC)    Neuromuscular disorder (HCC)    has /NMS stimulator    Vision abnormalities     Past Surgical History:  Procedure Laterality Date   COLONOSCOPY     last > 10 yrs ago in [place] - done for constipation   MENISCUS REPAIR Right    right knee replacement Right 2016   right TOtal knee relacement    UPPER GASTROINTESTINAL ENDOSCOPY      Family History  Problem Relation Age of Onset   Heart attack Father    Down syndrome Sister    Healthy Child    Colon polyps Neg Hx    Colon cancer Neg Hx    Esophageal cancer Neg Hx    Rectal cancer Neg Hx    Stomach cancer Neg Hx     Social History   Tobacco Use   Smoking status: Former   Smokeless tobacco: Never  Substance Use Topics   Alcohol use: Yes    Comment: Rare    Subjective:  Presents for yearly CPE today; continuing  to work with neurology for MS treatment;   Agreeable to getting pap smear updated today; needs updated order for mammogram as well;  Review of Systems  Constitutional: Negative.   HENT: Negative.    Eyes: Negative.   Respiratory: Negative.    Cardiovascular: Negative.   Gastrointestinal: Negative.   Genitourinary: Negative.   Musculoskeletal: Negative.   Skin: Negative.   Neurological:  Positive for sensory change and weakness.  Endo/Heme/Allergies: Negative.   Psychiatric/Behavioral: Negative.         Objective:  Vitals:   08/15/20 0819  BP: 90/60  Pulse: 61  Temp: 98.1 F (36.7 C)  TempSrc: Oral  SpO2: 99%  Weight: 106 lb 3.2 oz (48.2 kg)  Height: 5\' 4"  (1.626 m)    General: Well developed, well nourished, in no acute distress  Skin : Warm and dry.  Head: Normocephalic and atraumatic  Eyes: Sclera and conjunctiva clear; pupils round and reactive to light; extraocular movements intact  Ears: External normal; canals clear; tympanic membranes normal  Oropharynx: Pink, supple. No suspicious lesions  Neck: Supple without thyromegaly, adenopathy  Lungs: Respirations unlabored; clear to auscultation bilaterally without wheeze, rales, rhonchi  CVS exam: normal rate and regular rhythm.  Abdomen: Soft; nontender; nondistended; normoactive bowel sounds; no masses or hepatosplenomegaly  Musculoskeletal: No deformities; no active joint inflammation  Extremities: No edema, cyanosis, clubbing  Vessels: Symmetric bilaterally  Neurologic: Alert and oriented; speech intact; face symmetrical; moves all extremities well; CNII-XII intact without focal deficit  Pelvic exam: normal external genitalia, vulva, vagina, cervix, uterus and adnexa.  Assessment:  1. PE (physical exam), annual   2. Visit for screening mammogram   3. Lipid screening   4. Cervical cancer screening     Plan:  Age appropriate preventive healthcare needs addressed; encouraged regular eye doctor and dental  exams; encouraged regular exercise; will update labs and refills as needed today; follow-up to be determined; Thin Prep pap collected;  Handicapped placard updated today;  Follow up in 1 year, sooner prn.  This visit occurred during the SARS-CoV-2 public health emergency.  Safety protocols were in place, including screening questions prior to the visit, additional usage of staff PPE, and extensive cleaning of exam room while observing appropriate contact time as indicated for disinfecting solutions.    No follow-ups on file.  Orders Placed This Encounter  Procedures   MM Digital Screening    Standing Status:   Future    Standing Expiration Date:   08/15/2021    Order Specific Question:   Reason for Exam (SYMPTOM  OR DIAGNOSIS REQUIRED)    Answer:   screening mammogram    Order Specific Question:   Is the patient pregnant?    Answer:   No    Order Specific Question:   Preferred imaging location?    Answer:   [facility]   CBC with Differential/Platelet   Comp Met (CMET)   Lipid panel   TSH    Requested Prescriptions    No prescriptions requested or ordered in this encounter

## 2020-08-19 LAB — CYTOLOGY - PAP
Comment: NEGATIVE
Diagnosis: NEGATIVE
High risk HPV: NEGATIVE

## 2020-08-20 ENCOUNTER — Encounter: Payer: Self-pay | Admitting: Family

## 2020-09-12 ENCOUNTER — Encounter: Payer: Self-pay | Admitting: Family

## 2020-09-23 ENCOUNTER — Encounter: Payer: Self-pay | Admitting: Family

## 2020-09-23 ENCOUNTER — Ambulatory Visit: Payer: BC Managed Care – PPO | Admitting: Family

## 2020-09-23 ENCOUNTER — Other Ambulatory Visit: Payer: Self-pay

## 2020-09-23 VITALS — BP 100/80 | HR 60 | Temp 97.8°F | Ht 64.0 in | Wt 108.8 lb

## 2020-09-23 DIAGNOSIS — M79672 Pain in left foot: Secondary | ICD-10-CM | POA: Diagnosis not present

## 2020-09-23 DIAGNOSIS — R600 Localized edema: Secondary | ICD-10-CM

## 2020-09-23 MED ORDER — CEPHALEXIN 500 MG PO CAPS
500.0000 mg | ORAL_CAPSULE | Freq: Three times a day (TID) | ORAL | 0 refills | Status: DC
Start: 2020-09-23 — End: 2020-10-02

## 2020-09-23 NOTE — Progress Notes (Signed)
Jessica Salazar is a 55 y.o. female with the following history as recorded in EpicCare:  Patient Active Problem List   Diagnosis Date Noted   Dysesthesia 06/04/2016   Urinary frequency 06/04/2016   Insomnia 06/04/2016   Greater trochanteric bursitis of right hip 06/10/2015   Gait abnormality 12/30/2014   Multiple sclerosis (HCC) 09/25/2010    Current Outpatient Medications  Medication Sig Dispense Refill   cephALEXin (KEFLEX) 500 MG capsule Take 1 capsule (500 mg total) by mouth 3 (three) times daily. 21 capsule 0   Cholecalciferol (VITAMIN D) 50 MCG (2000 UT) CAPS 3,000 Units.     Multiple Vitamins-Minerals (MULTIVITAMIN GUMMIES ADULT PO) Take by mouth. Olly gummy multivitamin 2 po daily     Ocrelizumab (OCREVUS IV) Inject into the vein. Infusion 2 times a year     No current facility-administered medications for this visit.    Allergies: Patient has no known allergies.  Past Medical History:  Diagnosis Date   Arthritis    Constipation    due to MS   Heart murmur    MVP   History of recurrent UTIs    Hypotension    Mitral valve prolapse    Multiple sclerosis (HCC)    Neuromuscular disorder (HCC)    has /NMS stimulator    Vision abnormalities     Past Surgical History:  Procedure Laterality Date   COLONOSCOPY     last > 10 yrs ago in [place] - done for constipation   MENISCUS REPAIR Right    right knee replacement Right 2016   right TOtal knee relacement    UPPER GASTROINTESTINAL ENDOSCOPY      Family History  Problem Relation Age of Onset   Heart attack Father    Down syndrome Sister    Healthy Child    Colon polyps Neg Hx    Colon cancer Neg Hx    Esophageal cancer Neg Hx    Rectal cancer Neg Hx    Stomach cancer Neg Hx     Social History   Tobacco Use   Smoking status: Former   Smokeless tobacco: Never  Substance Use Topics   Alcohol use: Yes    Comment: Rare    Subjective:   Left ankle pain/ swelling x 2 days; did injure her left foot in her  kitchen over the weekend- concerned that she broke her toe; No specific injury or trauma to the left foot/ ankle;     Objective:  Vitals:   09/23/20 1557  BP: 100/80  Pulse: 60  Temp: 97.8 F (36.6 C)  TempSrc: Oral  SpO2: 99%  Weight: 108 lb 12.8 oz (49.4 kg)  Height: 5' 4" (1.626 m)    General: Well developed, well nourished, in no acute distress  Skin : Warm and dry.  Head: Normocephalic and atraumatic  Eyes: Sclera and conjunctiva clear; pupils round and reactive to light; extraocular movements intact  Ears: External normal; canals clear; tympanic membranes normal  Oropharynx: Pink, supple. No suspicious lesions  Neck: Supple without thyromegaly, adenopathy  Lungs: Respirations unlabored; clear to auscultation bilaterally without wheeze, rales, rhonchi  Musculoskeletal: No deformities; no active joint inflammation  Extremities mild swelling noted over left ankle, mild erythema,  cyanosis, clubbing  Vessels: Symmetric bilaterally  Neurologic: Alert and oriented; speech intact; face symmetrical; moves all extremities well; CNII-XII intact without focal deficit  Assessment:  1. Pedal edema   2. Left foot pain     Plan:  Order for stat doppler; check   labs today; Rx for Keflex to treat for possible infection; continue to ice and elevate the leg; Order for left foot X-ray;    This visit occurred during the SARS-CoV-2 public health emergency.  Safety protocols were in place, including screening questions prior to the visit, additional usage of staff PPE, and extensive cleaning of exam room while observing appropriate contact time as indicated for disinfecting solutions.   No follow-ups on file.  Orders Placed This Encounter  Procedures   DG Foot Complete Left    Standing Status:   Future    Standing Expiration Date:   09/23/2021    Order Specific Question:   Reason for Exam (SYMPTOM  OR DIAGNOSIS REQUIRED)    Answer:   left foot pain    Order Specific Question:   Is patient  pregnant?    Answer:   No    Order Specific Question:   Preferred imaging location?    Answer:   [address]   CBC with Differential/Platelet   Brain natriuretic peptide   Comp Met (CMET)    Requested Prescriptions   Signed Prescriptions Disp Refills   cephALEXin (KEFLEX) 500 MG capsule 21 capsule 0    Sig: Take 1 capsule (500 mg total) by mouth 3 (three) times daily.

## 2020-09-24 ENCOUNTER — Encounter: Payer: Self-pay | Admitting: Family

## 2020-09-24 ENCOUNTER — Ambulatory Visit (HOSPITAL_COMMUNITY)
Admission: RE | Admit: 2020-09-24 | Discharge: 2020-09-24 | Disposition: A | Discharge status: Home / Self Care | Payer: BC Managed Care – PPO | Source: Ambulatory Visit | Attending: Cardiovascular Disease | Admitting: Cardiovascular Disease

## 2020-09-24 DIAGNOSIS — R600 Localized edema: Secondary | ICD-10-CM | POA: Diagnosis present

## 2020-09-24 LAB — CBC WITH DIFFERENTIAL/PLATELET
Basophils Absolute: 0.1 10*3/uL (ref 0.0–0.1)
Basophils Relative: 0.6 % (ref 0.0–3.0)
Eosinophils Absolute: 0.2 10*3/uL (ref 0.0–0.7)
Eosinophils Relative: 2.1 % (ref 0.0–5.0)
HCT: 36.7 % (ref 36.0–46.0)
Hemoglobin: 12.3 g/dL (ref 12.0–15.0)
Lymphocytes Relative: 17.2 % (ref 12.0–46.0)
Lymphs Abs: 1.4 10*3/uL (ref 0.7–4.0)
MCHC: 33.5 g/dL (ref 30.0–36.0)
MCV: 90.1 fl (ref 78.0–100.0)
Monocytes Absolute: 0.9 10*3/uL (ref 0.1–1.0)
Monocytes Relative: 10.2 % (ref 3.0–12.0)
Neutro Abs: 5.9 10*3/uL (ref 1.4–7.7)
Neutrophils Relative %: 69.9 % (ref 43.0–77.0)
Platelets: 250 10*3/uL (ref 150.0–400.0)
RBC: 4.07 Mil/uL (ref 3.87–5.11)
RDW: 13.5 % (ref 11.5–15.5)
WBC: 8.4 10*3/uL (ref 4.0–10.5)

## 2020-09-24 LAB — COMPREHENSIVE METABOLIC PANEL
ALT: 10 U/L (ref 0–35)
AST: 15 U/L (ref 0–37)
Albumin: 4.3 g/dL (ref 3.5–5.2)
Alkaline Phosphatase: 58 U/L (ref 39–117)
BUN: 13 mg/dL (ref 6–23)
CO2: 29 mEq/L (ref 19–32)
Calcium: 9.6 mg/dL (ref 8.4–10.5)
Chloride: 103 mEq/L (ref 96–112)
Creatinine, Ser: 0.49 mg/dL (ref 0.40–1.20)
GFR: 104.7 mL/min (ref >60.00)
Glucose, Bld: 79 mg/dL (ref 70–99)
Potassium: 4.9 mEq/L (ref 3.5–5.1)
Sodium: 140 mEq/L (ref 135–145)
Total Bilirubin: 1.2 mg/dL (ref 0.2–1.2)
Total Protein: 6.6 g/dL (ref 6.0–8.3)

## 2020-09-24 LAB — BRAIN NATRIURETIC PEPTIDE: Pro B Natriuretic peptide (BNP): 27 pg/mL (ref 0.0–100.0)

## 2020-09-25 ENCOUNTER — Other Ambulatory Visit: Payer: Self-pay

## 2020-09-25 ENCOUNTER — Other Ambulatory Visit: Payer: Self-pay | Admitting: Family

## 2020-09-25 ENCOUNTER — Ambulatory Visit (INDEPENDENT_AMBULATORY_CARE_PROVIDER_SITE_OTHER)
Admission: RE | Admit: 2020-09-25 | Discharge: 2020-09-25 | Disposition: A | Discharge status: Home / Self Care | Payer: BC Managed Care – PPO | Source: Ambulatory Visit | Attending: Family | Admitting: Family

## 2020-09-25 DIAGNOSIS — M25472 Effusion, left ankle: Secondary | ICD-10-CM

## 2020-09-25 DIAGNOSIS — M79672 Pain in left foot: Secondary | ICD-10-CM

## 2020-09-25 IMAGING — DX DG FOOT COMPLETE 3+V*L*
3 series · 3 of 3 positions shown · non-contrast
Comparison: None.

CLINICAL DATA: Lt foot injury x 5 days ago, pain/swelling since

EXAM:
LEFT ANKLE COMPLETE - 3+ VIEW; LEFT FOOT - COMPLETE 3+ VIEW

[foot ap]
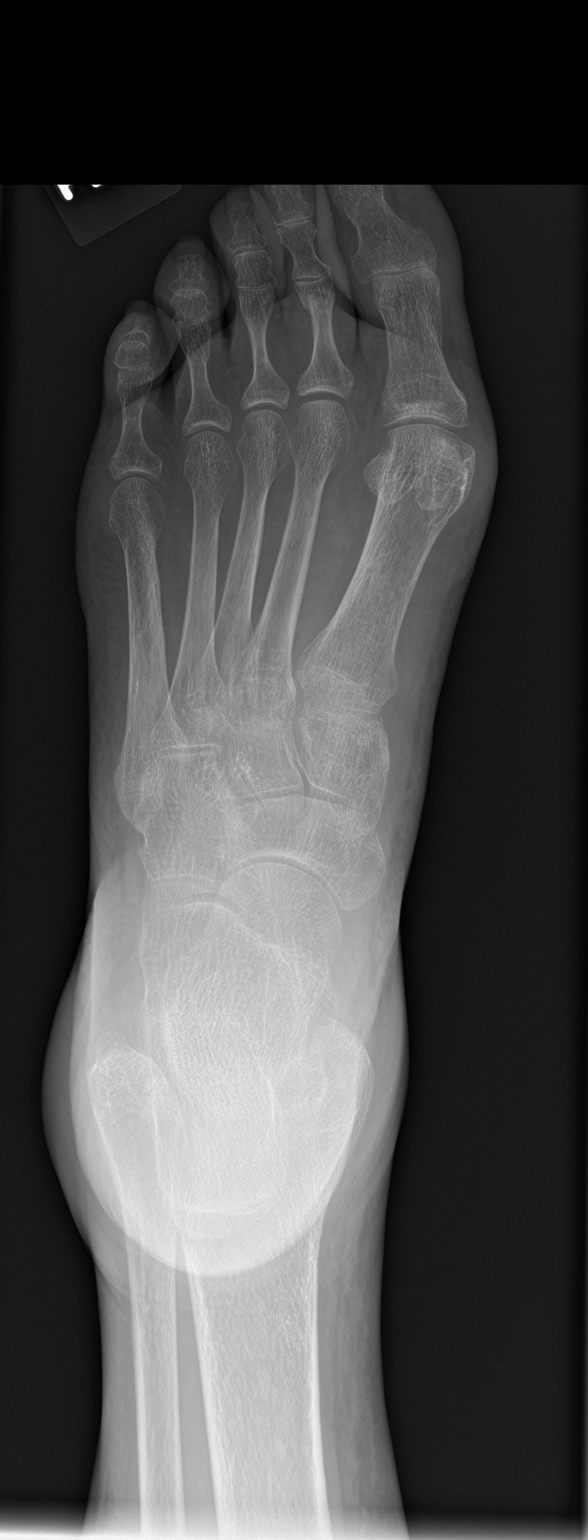

[foot obl]
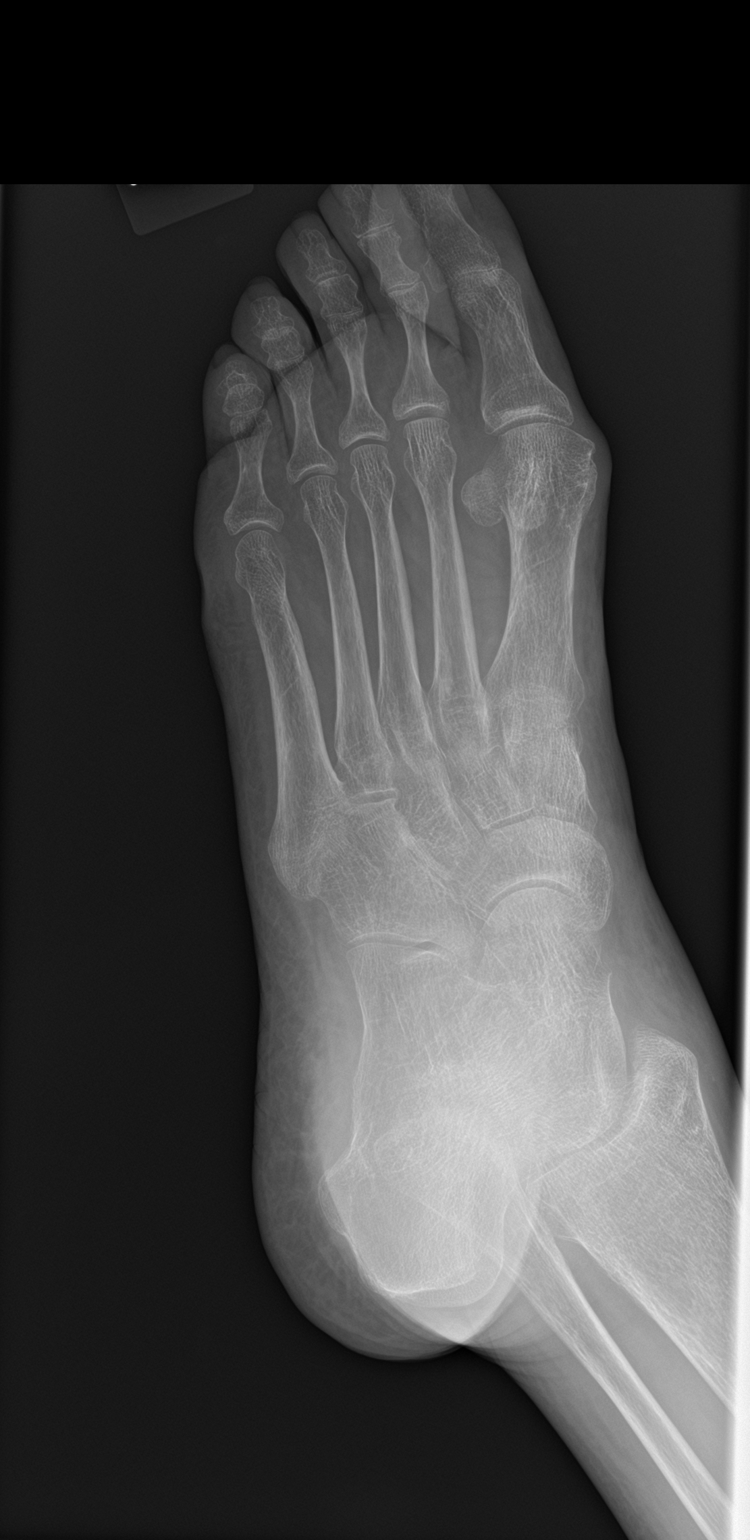

[foot lat]
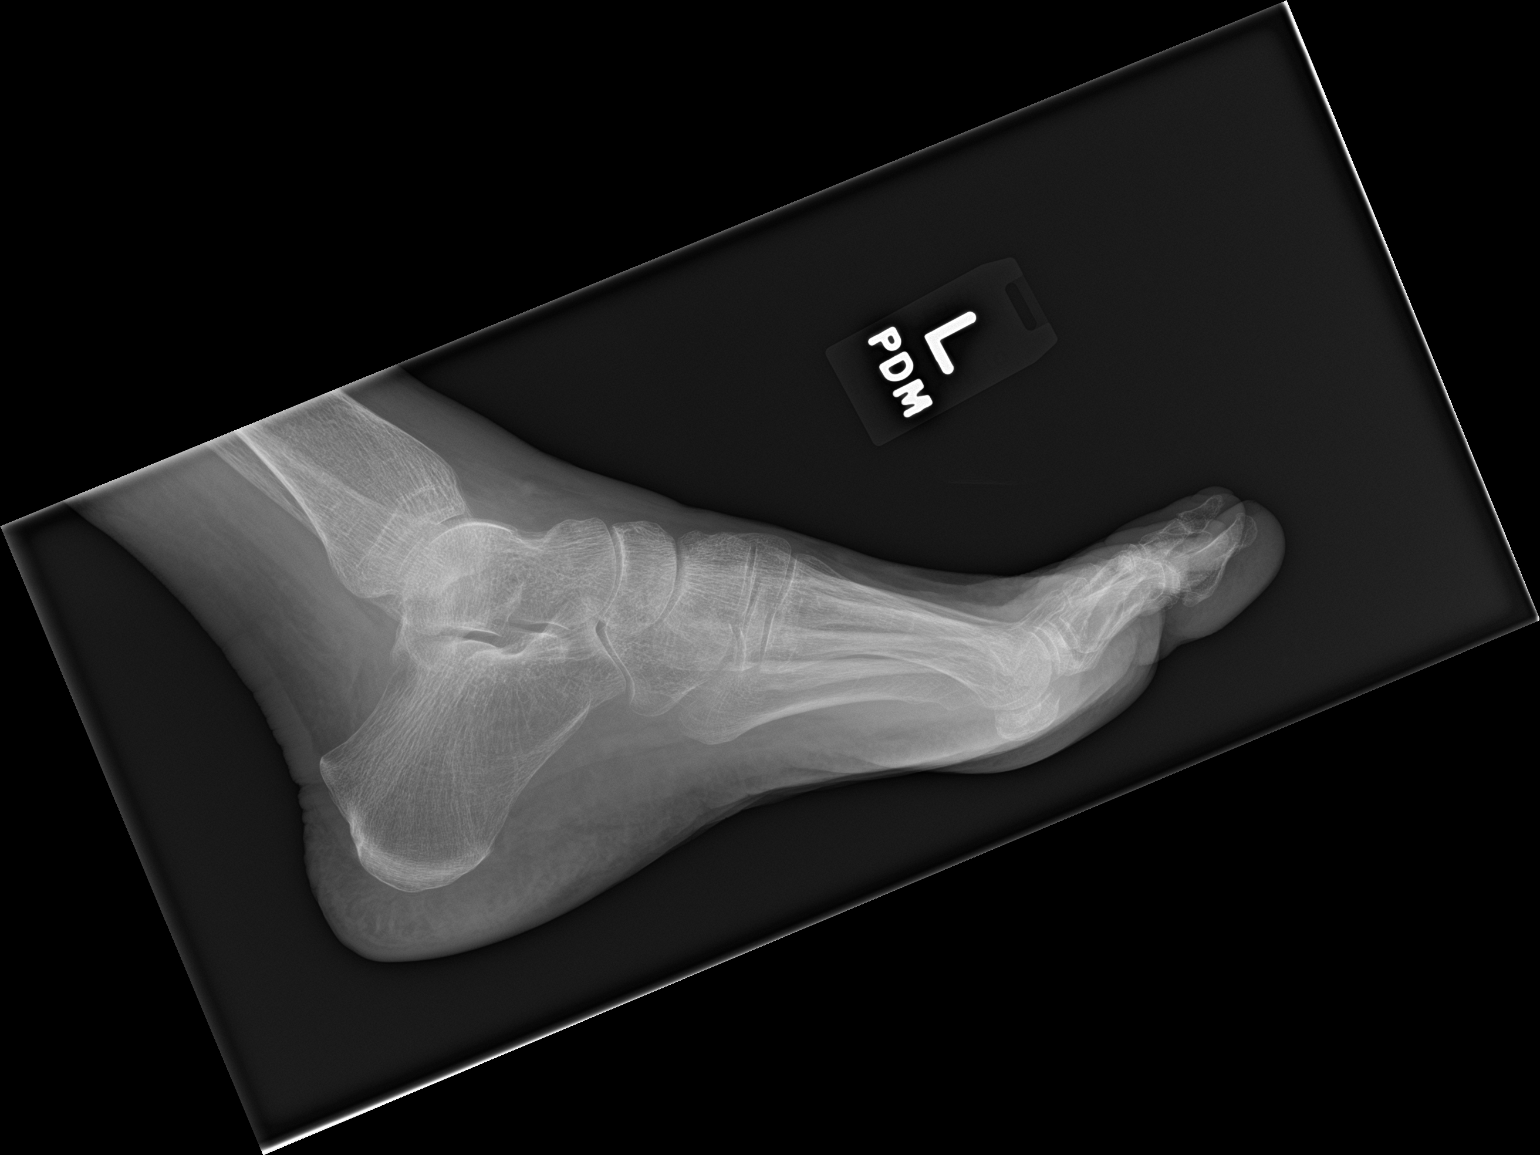

[3 of 3 positions shown; findings below may reference images not displayed]

FINDINGS: No evidence of fracture, dislocation, or joint effusion. No evidence
of severe arthropathy. No aggressive appearing focal bone
abnormality. Soft tissues are unremarkable.
IMPRESSION: No acute displaced fracture or dislocation of the bones of the left
foot and ankle.

## 2020-09-25 IMAGING — DX DG ANKLE COMPLETE 3+V*L*
3 series · 3 of 3 positions shown · non-contrast
Comparison: None.

CLINICAL DATA: Lt foot injury x 5 days ago, pain/swelling since

EXAM:
LEFT ANKLE COMPLETE - 3+ VIEW; LEFT FOOT - COMPLETE 3+ VIEW

[ankle ap]
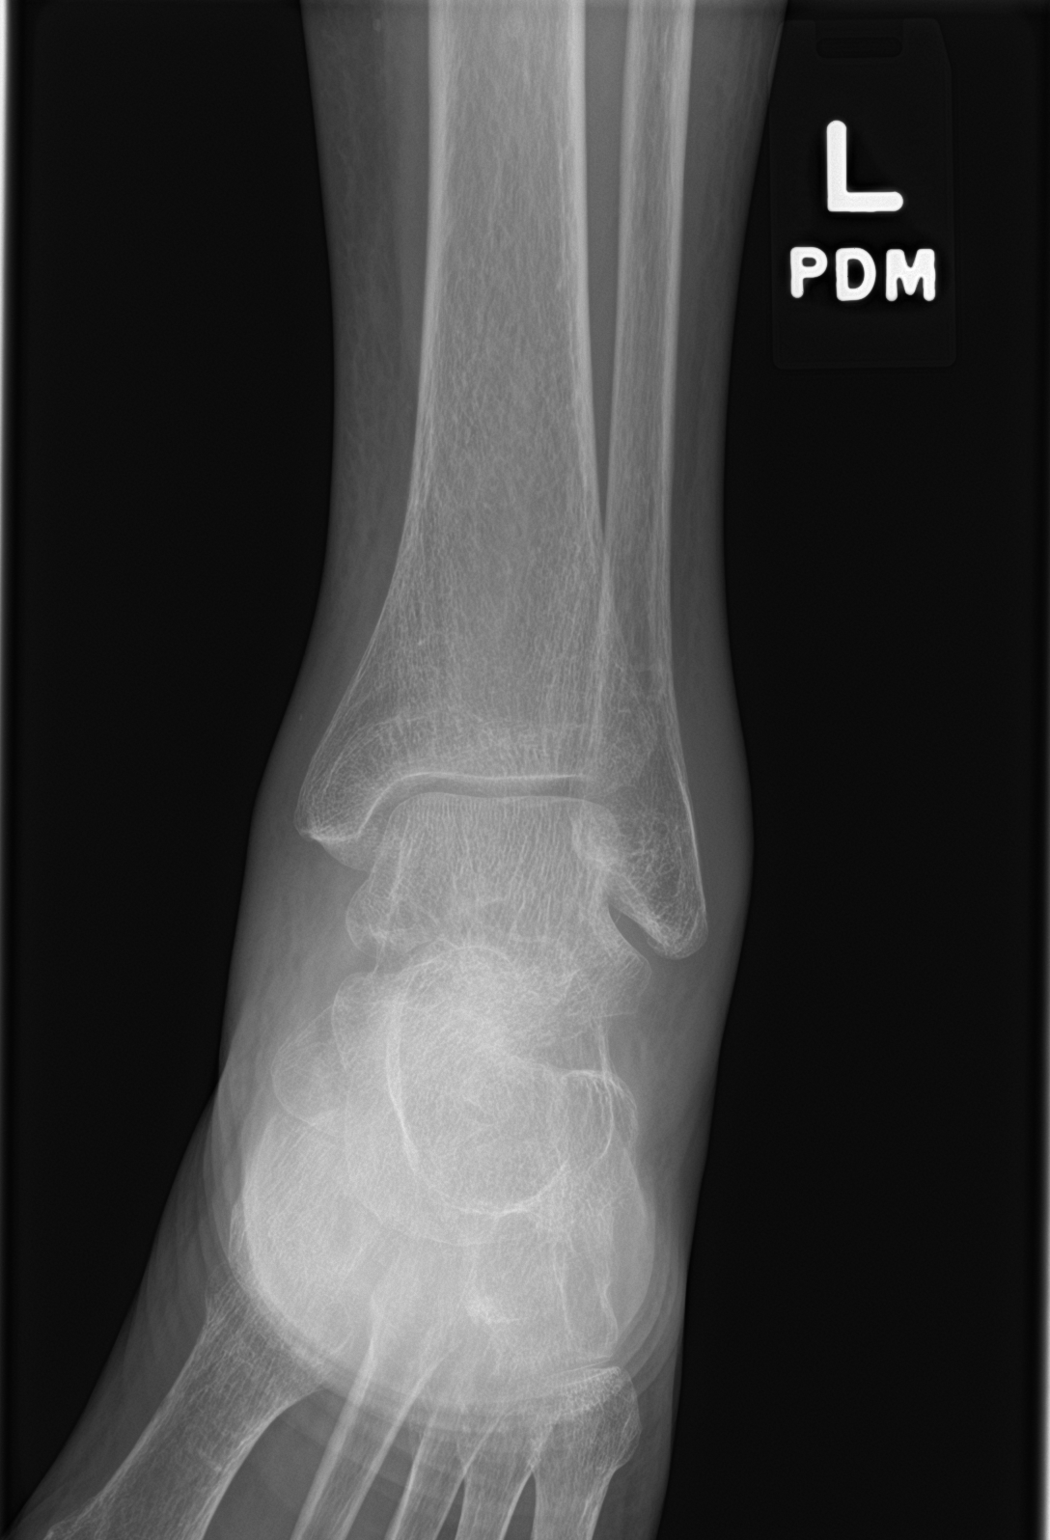

[ankle obl]
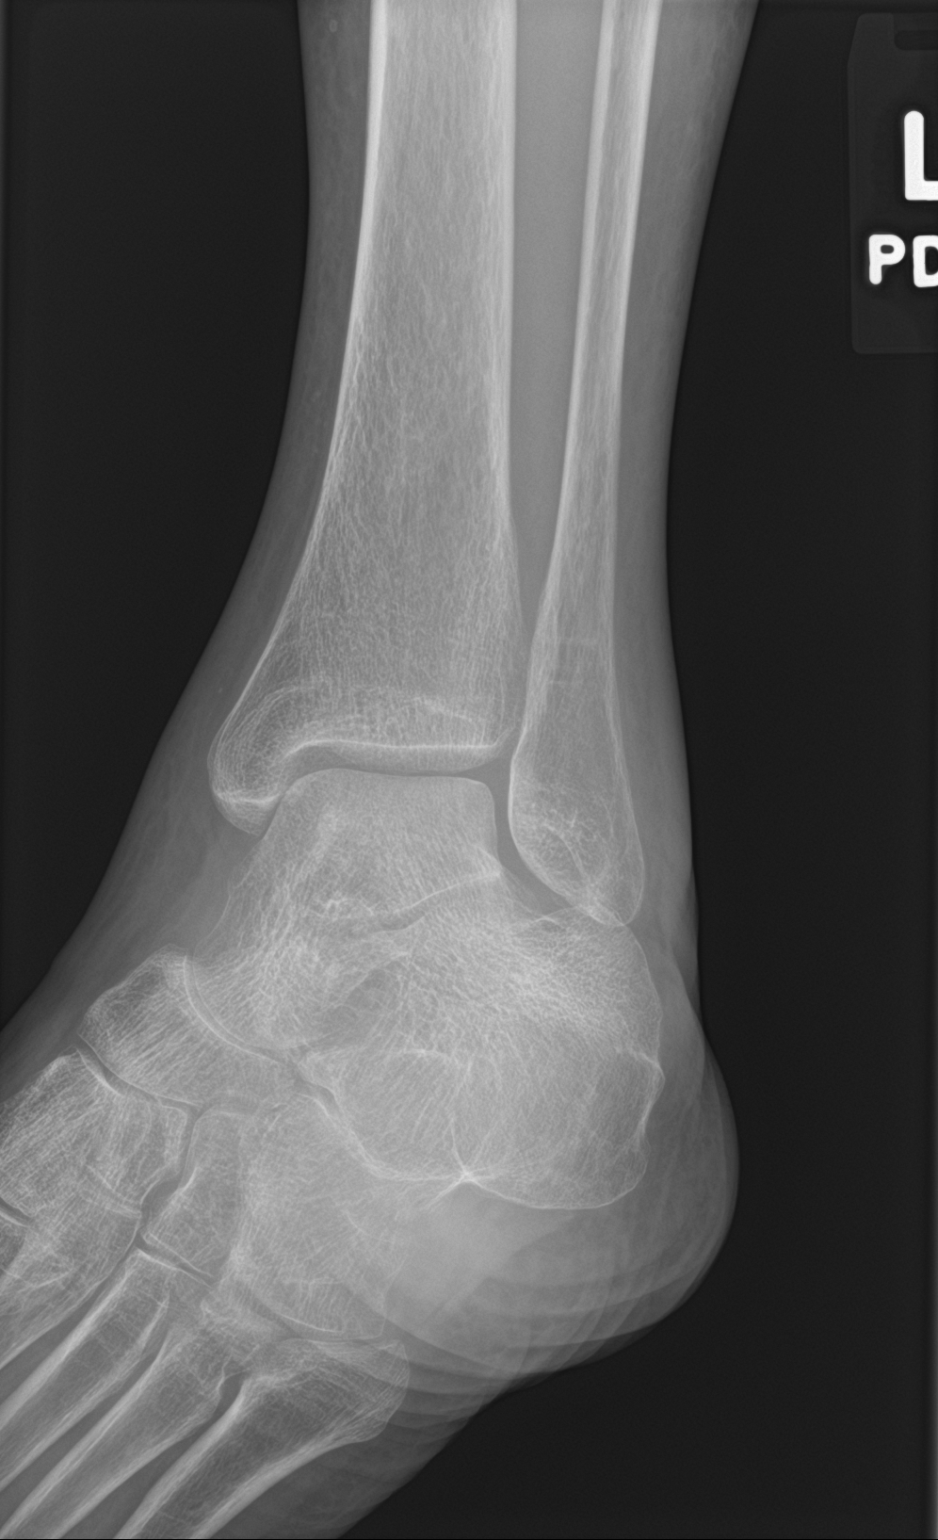

[ankle lat]
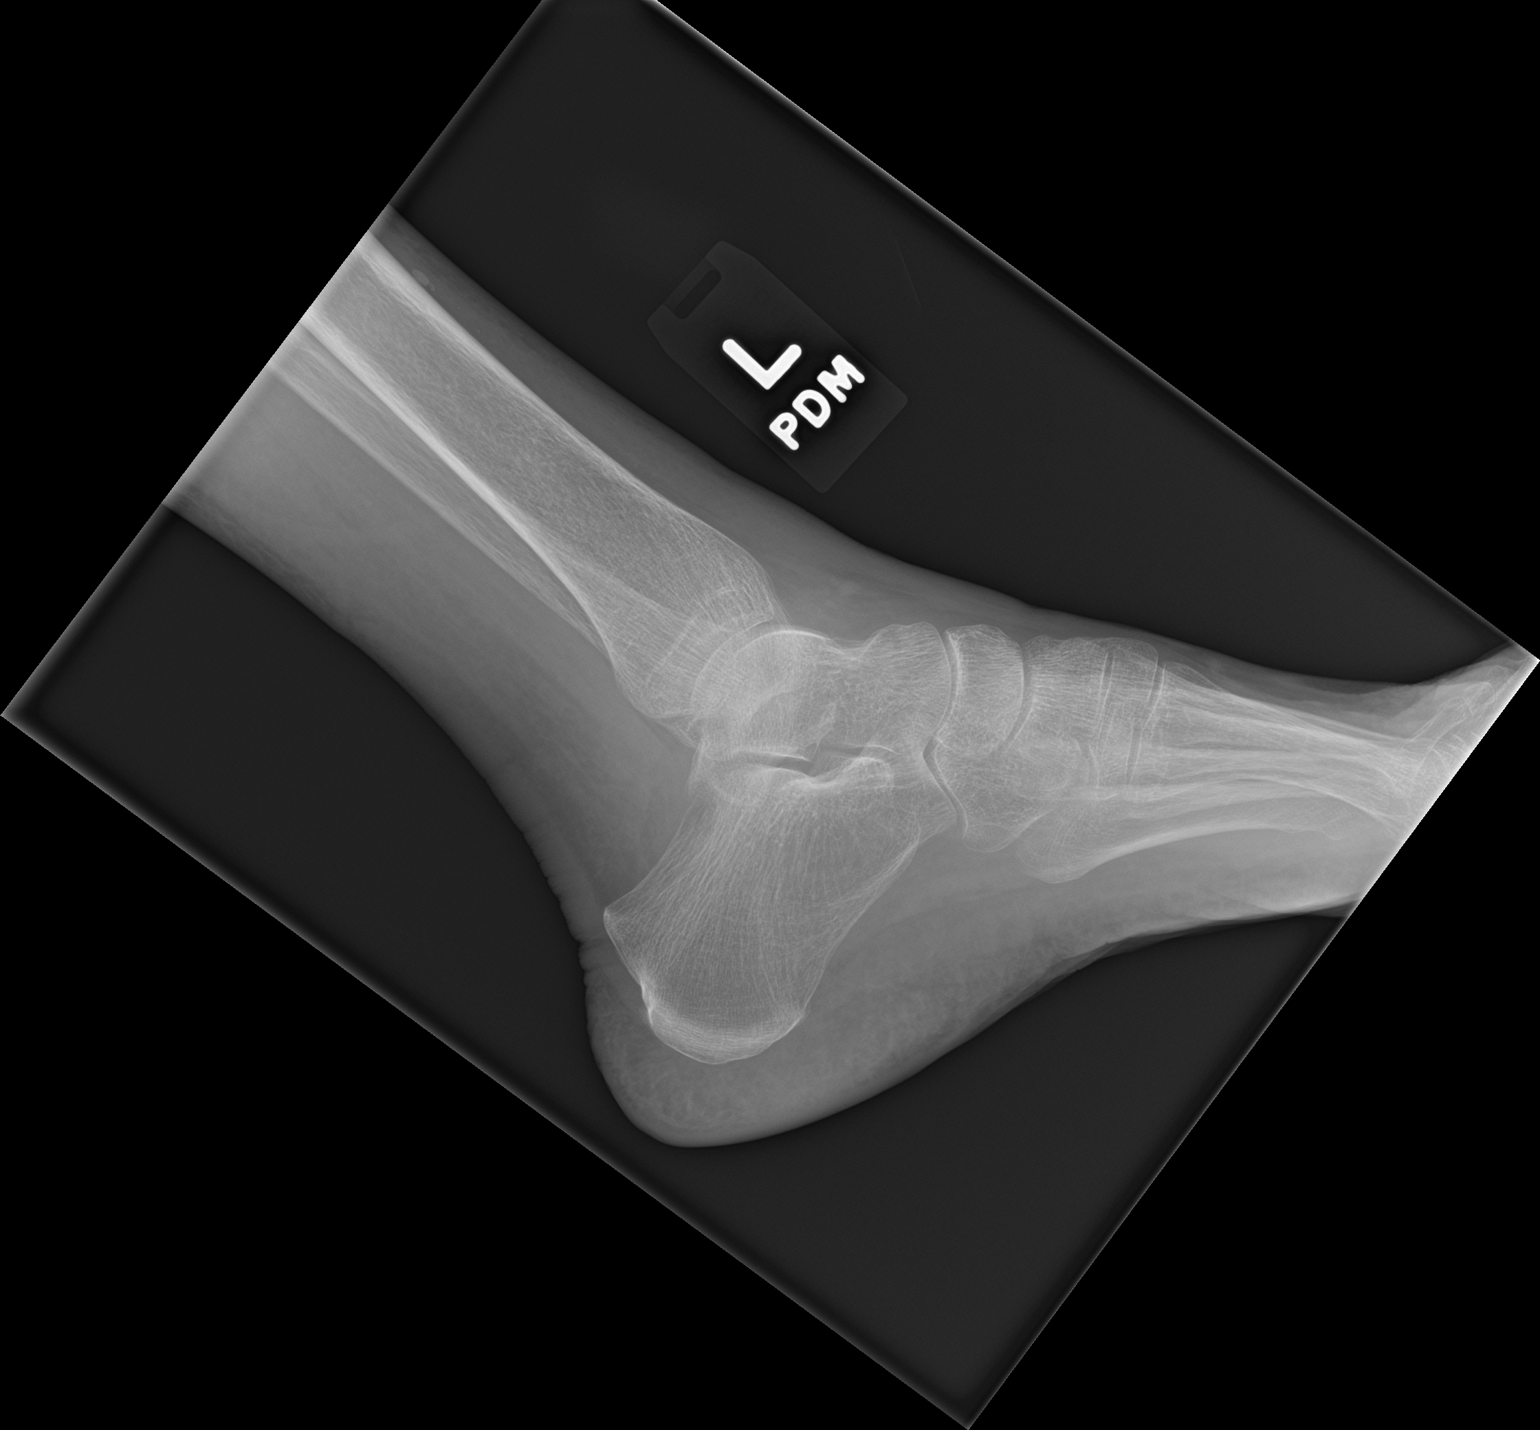

[3 of 3 positions shown; findings below may reference images not displayed]

FINDINGS: No evidence of fracture, dislocation, or joint effusion. No evidence
of severe arthropathy. No aggressive appearing focal bone
abnormality. Soft tissues are unremarkable.
IMPRESSION: No acute displaced fracture or dislocation of the bones of the left
foot and ankle.

## 2020-09-29 ENCOUNTER — Encounter: Payer: Self-pay | Admitting: Family

## 2020-10-01 ENCOUNTER — Encounter: Payer: Self-pay | Admitting: Family

## 2020-10-02 ENCOUNTER — Other Ambulatory Visit: Payer: Self-pay | Admitting: Family

## 2020-10-02 MED ORDER — MELOXICAM 15 MG PO TABS: 15.0000 mg | ORAL_TABLET | Freq: Every day | ORAL | 0 refills | Status: DC

## 2020-10-15 ENCOUNTER — Encounter: Payer: Self-pay | Admitting: Family

## 2020-10-15 NOTE — Progress Notes (Signed)
NEUROLOGY FOLLOW UP OFFICE NOTE  Jessica Salazar [id number]  Assessment/Plan:   1  Multiple sclerosis - objectively via MRI and neurologic exam, stable.  Suspect fluctuations of chronic symptoms related to environment. 2  Notalgia paresthetica  DMT:  Ocrevus D3 3000 IU daily Recheck quantitative immunoglobulin and vitamin D today and again in 6 months Consider OT for right upper extremity weakness.  Will let me know May use a topical anesthetic ointment for the itching/burning between shoulder blades. Follow up in 6 months.    Subjective:  Jessica Salazar is a 55 year old right-handed Caucasian woman who follows up for multiple sclerosis.  she is accompanied by her significant other.   UPDATE: Current DMT:  Ocrevus (last infusion in 07/24/2020); D3 3000 IU daily  In May, she reported worsening of her right-sided weakness.  MRI of brain and cervical spine with and without contrast on 06/14/2020 personally reviewed were stable compared to prior MRIs from November 2021.  Last Ocrevus infusion was on 07/24/2020.  A couple of days later, she endorsed left arm and hand weakness.  She was prescribed a prednisone taper which helped.  She thinks it was the humidity in the office.     05/29/2020 LABS:  IgA 155, IgG 878, IgM 69; vit D 45.27.  Advised to increase D3 to 3000 IU daily.  Burning in back, right hand feels weaker - feels like a burning itch on back with shirt on - between shoulder blad   Vision:  Left worse than right blurred vision.  Eye exam on 9/19 showed no evidence of optic neuritis. Motor:  Reports that right side still feels weaker overall.  Difficult to use right hand and to lift right leg. Sensory:  Paresthesias of left hand and from below chest down to and including both feet. Pain:  Notes burning and itching sensation between her shoulder blades.  Chronic pain in right leg secondary to prior trauma/fractures and hyperextension/pain of right knee pain. Gait:  Unsteady.   Due to right sided weakness.  Looking at getting a standup walker.  Had to cancel last 4 sessions of physical therapy since dropping a canister of sugar on her right foot, which was bruised but no fractures.   Bowel/Bladder:  Increased urinary frequency and urgency.  Needs to get up and urinate up to 4 times during the night.  Has a recent UTI.  Constipation. Fatigue:  Mild fatigue.  Able to function.   Cognition:  Mild word-finding issues Mood:  Denies depression or anxiety. She gets nauseous from time to time.  Uses promethazine.   HISTORY: She was diagnosed with multiple sclerosis in 1993 after presenting with painful burning and tingling sensation in the upper back and chest as well as difficulty with balance.  Diagnosis was concerned via MRI.  She did not undergo lumbar puncture.  She had recurrent episodes of optic neuritis in the mid 2000s requiring treatment with Solu-Medrol.  Other exacerbations include paresthesias and increased difficulty with balance and typically right sided weakness.  She last saw a neurologist in 2018, Dr. Arlice Colt.  At that time, she was started on Aubagio but caused hair loss and didn't think she was getting relief so she stopped.   She initially was started on Avonex in late-90s or early 2000s.  She started Rebiff around 2003 but had decreased appetite and stopped after 2 years.  She was on Aubagio in 2018 for 2 months.   She does not like the idea of  taking DMT.   In September 2020, she had a flare up of right sided weakness.  She received Solu-Medrol and symptoms dissipated 3 to 4 weeks but still wears an ankle brace because her foot turns in.  Her previous significant exacerbation occurred 2 to 3 years ago.     Past DMT:  Rebif (did not like the way it made her feel); Avonex (did not like the way it made her feel); Aubagio (hair loss)   Imaging: 06/20/2016 MRI BRAIN WO W:  Multiple T2/FLAIR hyperintense foci in the cerebral hemispheres, primarily  involving the periventricular and juxtacortical white matter.  No involvement of cerebellum and brainstem.  Normal enhancement pattern (reportedly stable compared to prior imaging in 2011). 06/20/2016 MRI CERVICAL WO W:  Hyperintense foci within spinal cord at C4-C5, C5, C6, C7 and possibly C3-C4 levels consistent with chronic demyelinating plaques (reportedly stable compared to prior imaging in 2011). 12/16/2018 MRI BRAIN W WO (personally reviewed):  1. Patchy T2/FLAIR hyperintensities involving the periventricular, deep, and juxta cortical white matter of both cerebral hemispheres as above, consistent with provided history of chronic multiple sclerosis. Overall, appearance is similar as compared to previous exam from 2018, without evidence for significant disease progression. No evidence for active demyelination. 2. No other acute intracranial abnormality identified. 12/16/2018 MRI CERVICAL SPINE W WO:  1. Extensive patchy signal abnormality throughout the cervical spinal cord as detailed above, compatible with history of chronic multiple sclerosis. Overall, appearance is similar as compared to previous exam from 2018, without evidence for significant disease progression. No evidence for active demyelination.  2. Left foraminal disc osteophyte complex and facet degeneration at C3-4 with resultant moderate left C4 foraminal stenosis.  3. Additional multifactorial degenerative changes with resultant mild to moderate bilateral C5 through C7 foraminal narrowing as above. 11/17/2019 MRI BRAIN W WO:  Relatively mild white matter changes consistent with multiple sclerosis. Stable MRI without change from 12/16/2018. 11/17/2019 MRI CERVICAL SPINE W WO:  Multiple cord lesions compatible with chronic multiple sclerosis. No new lesions. Largest lesions at C2 and C7 appears slightly less conspicuous. No lesions show abnormal enhancement.  Cervical spondylosis, stable from the prior study.   Labs: 06/05/2018:   Quantiferon TB Gold negative 11/21/2018 LABS:  Hep B S Ab non-reactive   Unknown family history of MS.  Her father passed away when she was a child and she is not familiar with her mother's side of family.  PAST MEDICAL HISTORY: Past Medical History:  Diagnosis Date   Arthritis    Constipation    due to MS   Heart murmur    MVP   History of recurrent UTIs    Hypotension    Mitral valve prolapse    Multiple sclerosis (HCC)    Neuromuscular disorder (HCC)    has /NMS stimulator    Vision abnormalities     MEDICATIONS: Current Outpatient Medications on File Prior to Visit  Medication Sig Dispense Refill   meloxicam (MOBIC) 15 MG tablet Take 1 tablet (15 mg total) by mouth daily. 20 tablet 0   Cholecalciferol (VITAMIN D) 50 MCG (2000 UT) CAPS 3,000 Units.     Multiple Vitamins-Minerals (MULTIVITAMIN GUMMIES ADULT PO) Take by mouth. Olly gummy multivitamin 2 po daily     Ocrelizumab (OCREVUS IV) Inject into the vein. Infusion 2 times a year     No current facility-administered medications on file prior to visit.    ALLERGIES: No Known Allergies  FAMILY HISTORY: Family History  Problem Relation Age of  Onset   Heart attack Father    Down syndrome Sister    Healthy Child    Colon polyps Neg Hx    Colon cancer Neg Hx    Esophageal cancer Neg Hx    Rectal cancer Neg Hx    Stomach cancer Neg Hx       Objective:  Blood pressure 121/80, pulse 66, height 5\' 4"  (1.626 m), weight 112 lb (50.8 kg), SpO2 98 %. General: No acute distress.  Patient appears well-groomed.   Head:  Normocephalic/atraumatic Eyes:  Fundi examined but not visualized Neck: supple, no paraspinal tenderness, full range of motion Heart:  Regular rate and rhythm Lungs:  Clear to auscultation bilaterally Back: No paraspinal tenderness Neurological Exam: alert and oriented to person, place, and time. Speech fluent and not dysarthric, language intact.  Right upper quadrant vision loss in left eye not  appreciated on today's exam.  Otherwise, CN II-XII intact. Bulk and tone normal, Decreased finger-thumb tapping speed slow on right.  Muscle strength 4+/5 right APB, 2/5 right hip flexion, otherwise otherwise 5-/5 throughout.  Pinprick sensation reduced up to above knees bilaterally and right upper extremity.  Reduced vibratory sensation in feet up to knees.  Deep tendon reflexes 3+ throughout (right slightly greater than left); bilateral Babinski.  and heel to shin testing intact.  Wide based, right hemiplegic gait with short stride.  Romberg with sway.   Metta Clines, DO  CC: Marrian Salvage, [place]

## 2020-10-16 ENCOUNTER — Other Ambulatory Visit (INDEPENDENT_AMBULATORY_CARE_PROVIDER_SITE_OTHER): Payer: BC Managed Care – PPO

## 2020-10-16 ENCOUNTER — Other Ambulatory Visit: Payer: Self-pay

## 2020-10-16 ENCOUNTER — Encounter: Payer: Self-pay | Admitting: Neurology

## 2020-10-16 ENCOUNTER — Ambulatory Visit: Payer: BC Managed Care – PPO | Admitting: Neurology

## 2020-10-16 VITALS — BP 121/80 | HR 66 | Ht 64.0 in | Wt 112.0 lb

## 2020-10-16 DIAGNOSIS — G35 Multiple sclerosis: Secondary | ICD-10-CM | POA: Diagnosis not present

## 2020-10-16 DIAGNOSIS — R202 Paresthesia of skin: Secondary | ICD-10-CM | POA: Diagnosis not present

## 2020-10-16 LAB — VITAMIN D 25 HYDROXY (VIT D DEFICIENCY, FRACTURES): VITD: 57.1 ng/mL (ref 30.00–100.00)

## 2020-10-16 NOTE — Patient Instructions (Signed)
Ocrevus D3 3000 IU daily Check immunoglobulin panel and vit D today and again in 6 months. Let me know if you would like to see occupational therapy Follow up in 6 months.

## 2020-10-17 ENCOUNTER — Other Ambulatory Visit: Payer: Self-pay | Admitting: Family

## 2020-10-17 LAB — IGG, IGA, IGM
IgG (Immunoglobin G), Serum: 732 mg/dL (ref 600–1640)
IgM, Serum: 56 mg/dL (ref 50–300)
Immunoglobulin A: 154 mg/dL (ref 47–310)

## 2020-10-17 MED ORDER — MECLIZINE HCL 25 MG PO TABS: 25.0000 mg | ORAL_TABLET | Freq: Three times a day (TID) | ORAL | 0 refills | Status: DC | PRN

## 2020-10-17 NOTE — Progress Notes (Signed)
Pt advised of her Labs.  Pt left PCP, Pt had a ear ache and a bad headache. She was given something for Vertigo.  Pt unable to focus. Just a FYI for Dr.Jaffe.

## 2020-10-22 ENCOUNTER — Ambulatory Visit: Payer: Self-pay

## 2020-11-05 ENCOUNTER — Other Ambulatory Visit: Payer: Self-pay

## 2020-11-05 ENCOUNTER — Ambulatory Visit
Admission: RE | Admit: 2020-11-05 | Discharge: 2020-11-05 | Disposition: A | Discharge status: Home / Self Care | Payer: BC Managed Care – PPO | Source: Ambulatory Visit | Attending: Family | Admitting: Family

## 2020-11-05 DIAGNOSIS — Z1231 Encounter for screening mammogram for malignant neoplasm of breast: Secondary | ICD-10-CM

## 2020-11-05 IMAGING — MG MM DIGITAL SCREENING BILAT W/ TOMO AND CAD
8 series · 9 of 24 positions shown · non-contrast
Comparison: Previous exam(s).

CLINICAL DATA: Screening.

EXAM:
DIGITAL SCREENING BILATERAL MAMMOGRAM WITH TOMOSYNTHESIS AND CAD
TECHNIQUE: Bilateral screening digital craniocaudal and mediolateral oblique
mammograms were obtained. Bilateral screening digital breast
tomosynthesis was performed. The images were evaluated with
computer-aided detection.

[R MLO synth-2D]
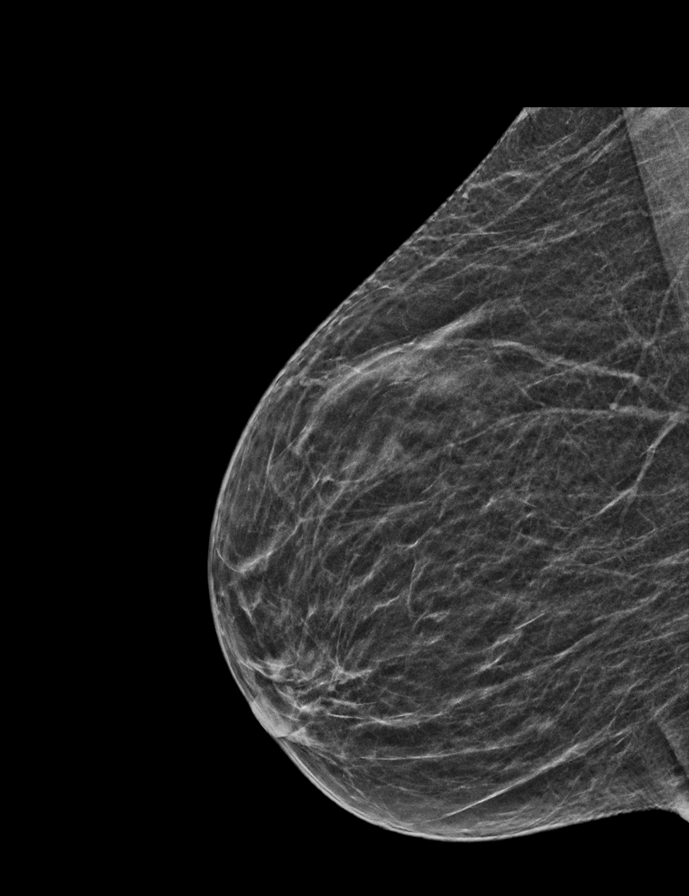

[L MLO synth-2D]
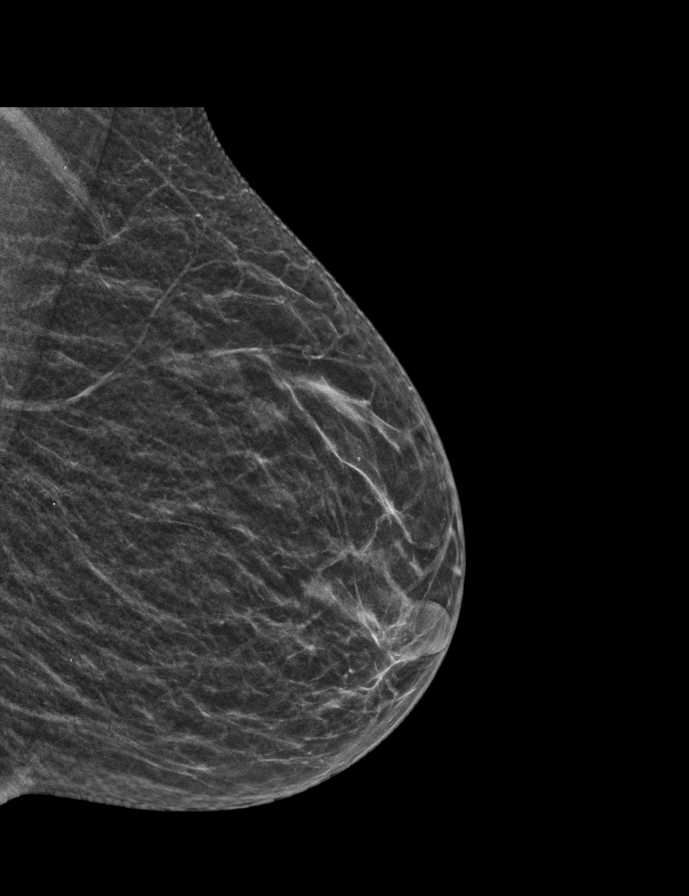

[R CC synth-2D]
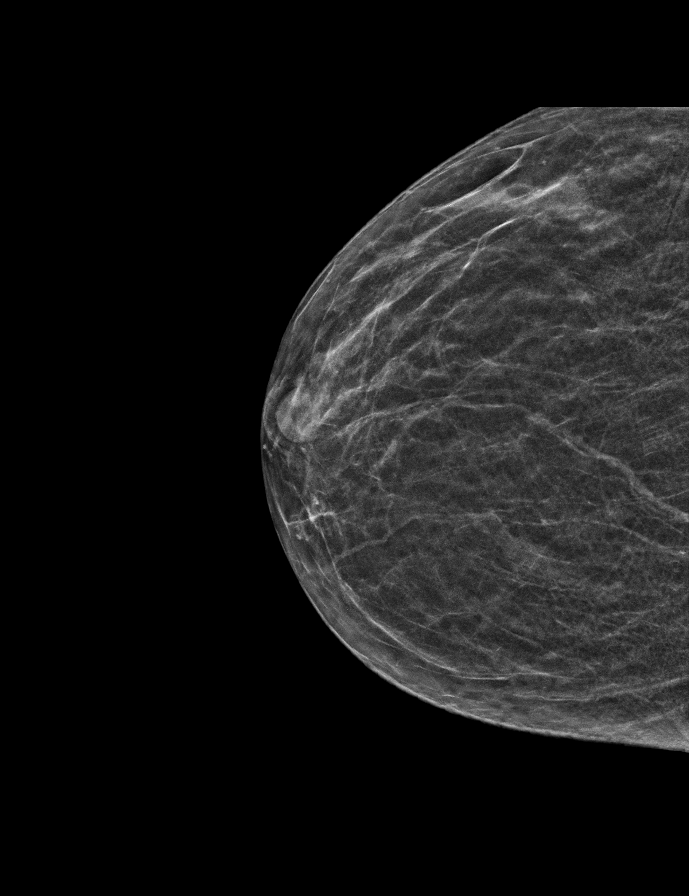

[L CC synth-2D]
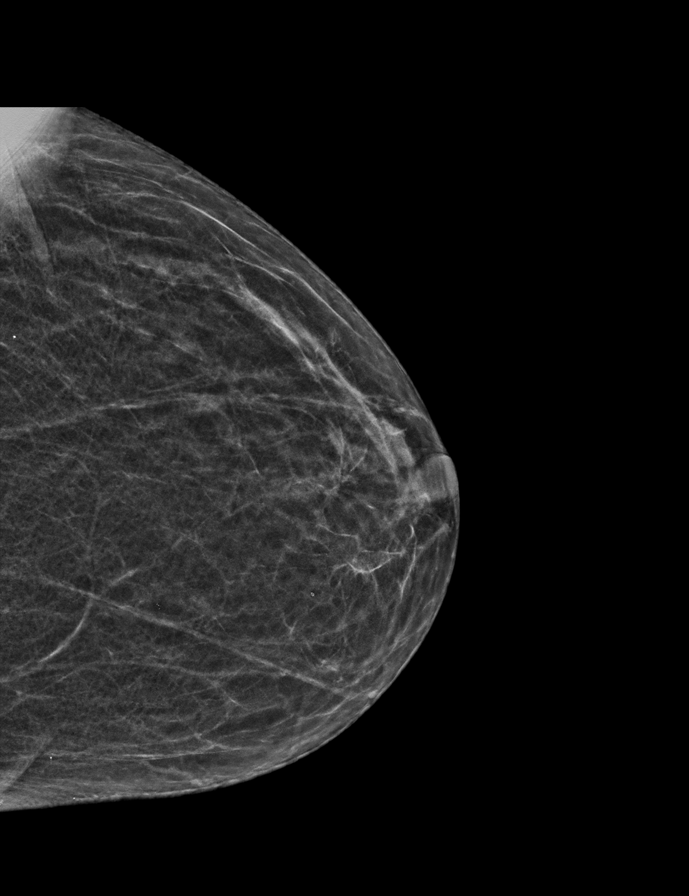

[L CC tomo · 2 of 42 frames shown]
[frame 14/42]
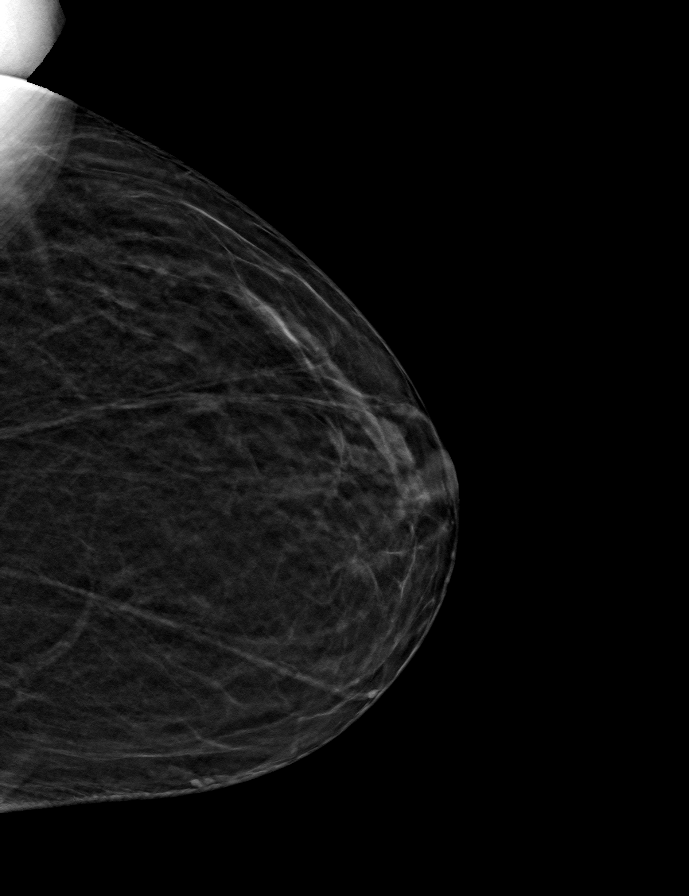
[frame 21/42]
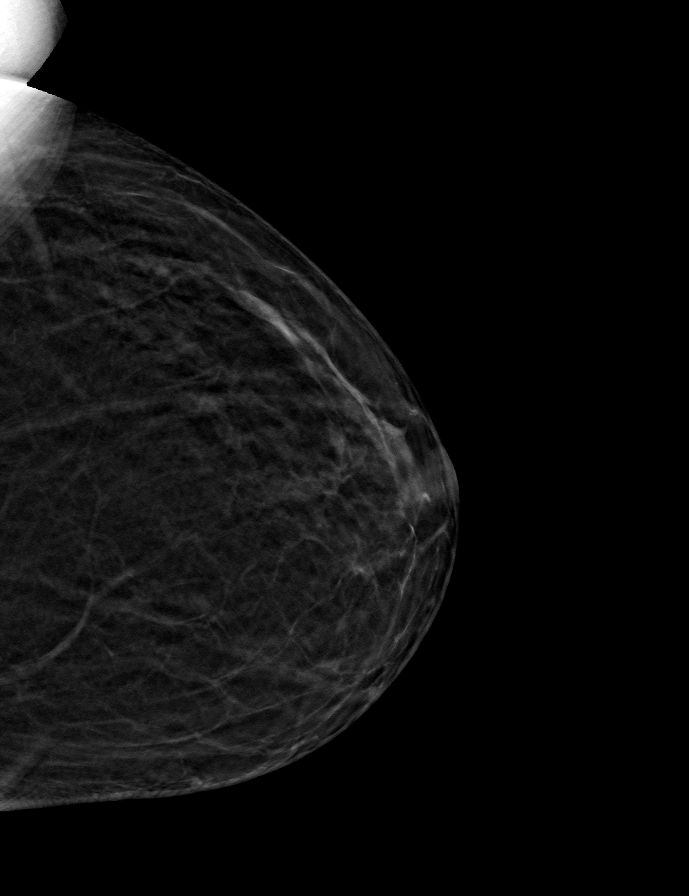

[L MLO tomo · tomo slice 21/42.0]
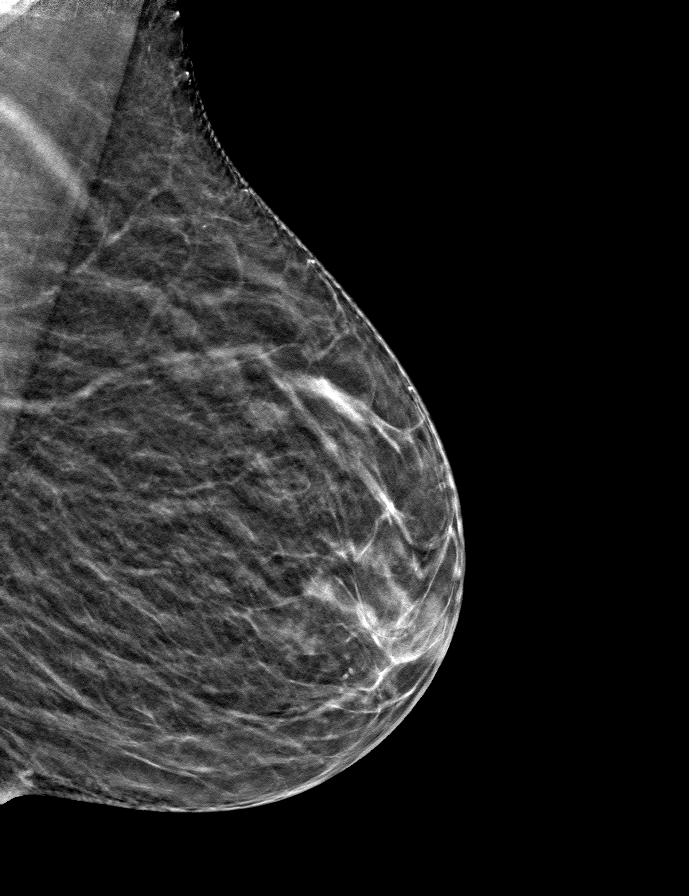

[R MLO tomo · tomo slice 21/41.0]
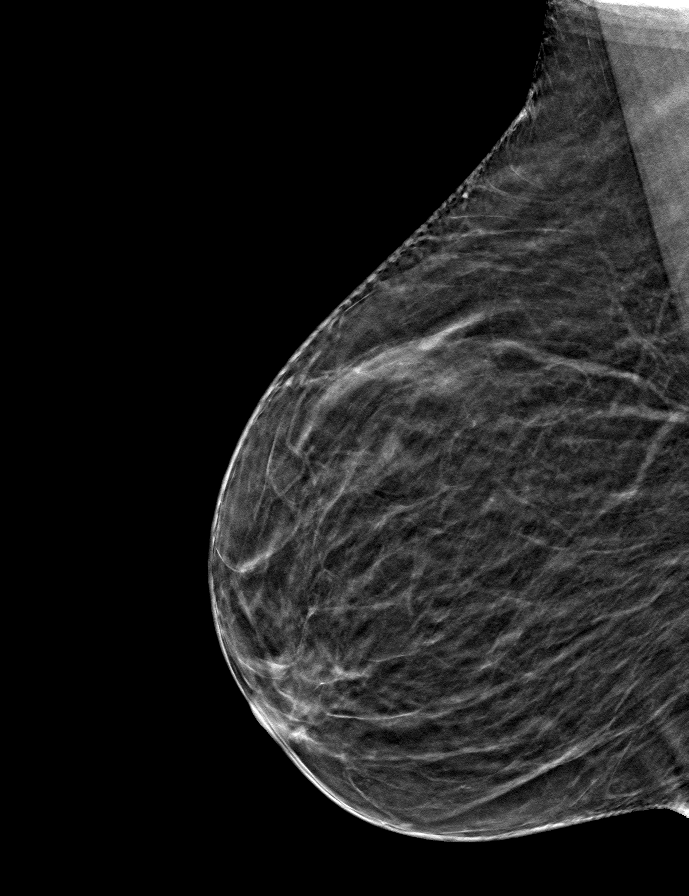

[R CC tomo · tomo slice 21/41.0]
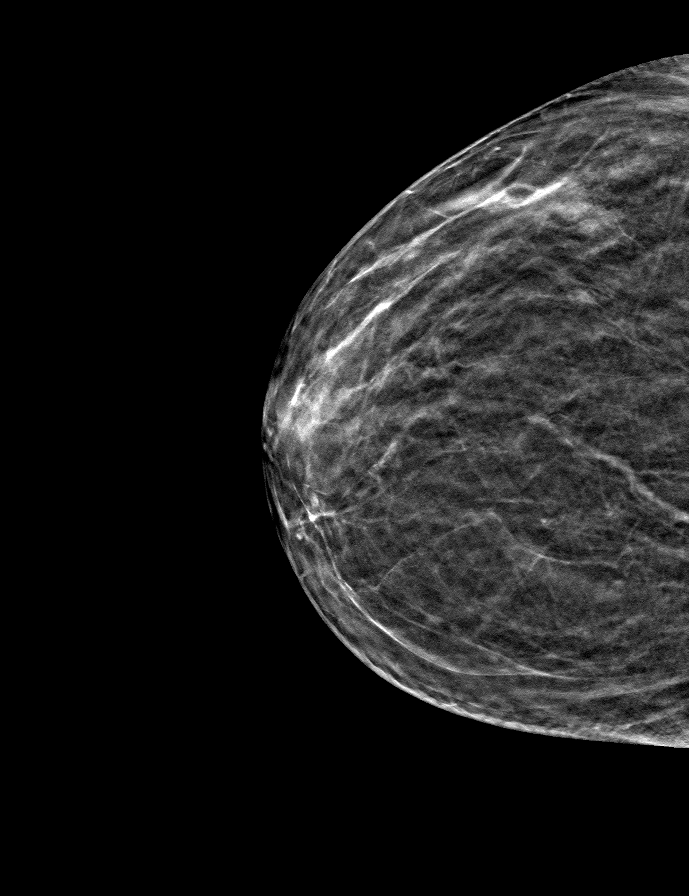

[9 of 24 positions shown; findings below may reference images not displayed]

ACR Breast Density Category b: There are scattered areas of
fibroglandular density.
FINDINGS: There are no findings suspicious for malignancy.
IMPRESSION: No mammographic evidence of malignancy. A result letter of this
screening mammogram will be mailed directly to the patient.

RECOMMENDATION:
Screening mammogram in one year. (Code:[BY])

BI-RADS CATEGORY  1: Negative.

## 2020-11-20 ENCOUNTER — Telehealth: Payer: Self-pay | Admitting: Neurology

## 2020-11-20 NOTE — Telephone Encounter (Signed)
Pt said she needs a call back ASAP regarding her infusion. She has left messages and in her my chart, states she has not heard back from anyone.

## 2020-11-20 NOTE — Telephone Encounter (Signed)
Spoke to insurance please out form to have location changed.   With the key [id number] attached Tax ID# [id number].

## 2020-11-21 NOTE — Telephone Encounter (Signed)
LMOVM tried having the location changed over the phone.  Advised to fill out form, I asked the rep to please fax over.  No fa received yet.

## 2020-11-25 ENCOUNTER — Other Ambulatory Visit: Payer: Self-pay

## 2020-11-25 DIAGNOSIS — G35 Multiple sclerosis: Secondary | ICD-10-CM

## 2020-11-25 NOTE — Progress Notes (Signed)
Per Dr.Jaffe, We can send a script for a wheelchair-

## 2020-12-01 ENCOUNTER — Ambulatory Visit: Payer: BC Managed Care – PPO | Admitting: Neurology

## 2020-12-02 NOTE — Progress Notes (Signed)
Referral sent to [facility], per pt request. Note to [place] sent to patient insurance.    Message received from [facility] need NPI# for [place].  NPI number given.

## 2020-12-02 NOTE — Progress Notes (Signed)
New PA sent through fax to [facility]. To change location.   Per Rep PA from June needs to be resent if the location needs to be changed.

## 2020-12-03 NOTE — Progress Notes (Signed)
F/u   Fax from [facility]   Approved  [place]  300mg  / 10 ml   Effective date  11.22.2022  to  11.21.2023

## 2020-12-08 ENCOUNTER — Encounter: Payer: Self-pay | Admitting: Neurology

## 2020-12-09 NOTE — Progress Notes (Signed)
Pt advised PA approved for [facility].  Referral faxed to [facility] 12/02/20

## 2020-12-17 NOTE — Progress Notes (Signed)
Per Mychart received from Patient. [facility] never received the referral.  Advised pt of 12/02/20 note referral faxed with note PA started.     Resent referral to [facility] [phone].

## 2020-12-25 ENCOUNTER — Telehealth: Payer: Self-pay | Admitting: Neurology

## 2020-12-25 NOTE — Telephone Encounter (Signed)
[facility] received a referral from [place] and needs additional information sent to them for the referral. They need the approval letter for the [place]. Fax number is [phone].

## 2020-12-26 NOTE — Telephone Encounter (Signed)
Referral and Pa faxed over 12/02/20,again 12/17/20.  Sending PA approval today for the third time. Tried speaking to [place] to inform them that I have sent this multiple time already phone to static to hear had to hang up.     Patient advised as well. LMOVM

## 2021-01-01 ENCOUNTER — Encounter: Payer: Self-pay | Admitting: Family

## 2021-01-06 ENCOUNTER — Encounter: Payer: Self-pay | Admitting: Family

## 2021-01-06 ENCOUNTER — Ambulatory Visit: Payer: BC Managed Care – PPO | Admitting: Family

## 2021-01-06 VITALS — BP 112/70 | HR 66 | Temp 97.6°F | Ht 64.0 in | Wt 108.0 lb

## 2021-01-06 DIAGNOSIS — R109 Unspecified abdominal pain: Secondary | ICD-10-CM | POA: Diagnosis not present

## 2021-01-06 LAB — POCT URINALYSIS DIP (MANUAL ENTRY)
Bilirubin, UA: NEGATIVE
Blood, UA: NEGATIVE
Glucose, UA: NEGATIVE mg/dL
Ketones, POC UA: NEGATIVE mg/dL
Leukocytes, UA: NEGATIVE
Nitrite, UA: NEGATIVE
Protein Ur, POC: NEGATIVE mg/dL
Spec Grav, UA: 1.025 (ref 1.010–1.025)
Urobilinogen, UA: 0.2 E.U./dL
pH, UA: 6 (ref 5.0–8.0)

## 2021-01-06 LAB — COMPREHENSIVE METABOLIC PANEL
ALT: 18 U/L (ref 0–35)
AST: 27 U/L (ref 0–37)
Albumin: 4.5 g/dL (ref 3.5–5.2)
Alkaline Phosphatase: 61 U/L (ref 39–117)
BUN: 17 mg/dL (ref 6–23)
CO2: 30 mEq/L (ref 19–32)
Calcium: 9.6 mg/dL (ref 8.4–10.5)
Chloride: 104 mEq/L (ref 96–112)
Creatinine, Ser: 0.47 mg/dL (ref 0.40–1.20)
GFR: 105.54 mL/min (ref >60.00)
Glucose, Bld: 78 mg/dL (ref 70–99)
Potassium: 4 mEq/L (ref 3.5–5.1)
Sodium: 141 mEq/L (ref 135–145)
Total Bilirubin: 1.3 mg/dL — ABNORMAL HIGH (ref 0.2–1.2)
Total Protein: 6.7 g/dL (ref 6.0–8.3)

## 2021-01-06 MED ORDER — MELOXICAM 15 MG PO TABS
15.0000 mg | ORAL_TABLET | Freq: Every day | ORAL | 0 refills | Status: DC
Start: 2021-01-06 — End: 2022-02-23

## 2021-01-06 MED ORDER — TIZANIDINE HCL 2 MG PO TABS: 2.0000 mg | ORAL_TABLET | Freq: Every day | ORAL | 0 refills | Status: DC

## 2021-01-06 NOTE — Progress Notes (Signed)
Jessica Salazar is a 55 y.o. female with the following history as recorded in EpicCare:  Patient Active Problem List   Diagnosis Date Noted   Dysesthesia 06/04/2016   Urinary frequency 06/04/2016   Insomnia 06/04/2016   Greater trochanteric bursitis of right hip 06/10/2015   Gait abnormality 12/30/2014   Multiple sclerosis ([place]) 09/25/2010    Current Outpatient Medications  Medication Sig Dispense Refill   Cholecalciferol (VITAMIN D) 50 MCG (2000 UT) CAPS 3,000 Units.     meclizine (ANTIVERT) 25 MG tablet Take 1 tablet (25 mg total) by mouth 3 (three) times daily as needed for dizziness. 30 tablet 0   meloxicam (MOBIC) 15 MG tablet Take 1 tablet (15 mg total) by mouth daily. (Patient not taking: Reported on 10/16/2020) 20 tablet 0   meloxicam (MOBIC) 15 MG tablet Take 1 tablet (15 mg total) by mouth daily. 30 tablet 0   Multiple Vitamins-Minerals (MULTIVITAMIN GUMMIES ADULT PO) Take by mouth. Olly gummy multivitamin 2 po daily     Ocrelizumab (OCREVUS IV) Inject into the vein. Infusion 2 times a year     tiZANidine (ZANAFLEX) 2 MG tablet Take 1 tablet (2 mg total) by mouth at bedtime. 30 tablet 0   No current facility-administered medications for this visit.    Allergies: Patient has no known allergies.  Past Medical History:  Diagnosis Date   Arthritis    Constipation    due to MS   Heart murmur    MVP   History of recurrent UTIs    Hypotension    Mitral valve prolapse    Multiple sclerosis (HCC)    Neuromuscular disorder (HCC)    has /NMS stimulator    Vision abnormalities     Past Surgical History:  Procedure Laterality Date   COLONOSCOPY     last > 10 yrs ago in [place] - done for constipation   MENISCUS REPAIR Right    right knee replacement Right 2016   right TOtal knee relacement    UPPER GASTROINTESTINAL ENDOSCOPY      Family History  Problem Relation Age of Onset   Heart attack Father    Down syndrome Sister    Healthy Child    Colon polyps Neg Hx     Colon cancer Neg Hx    Esophageal cancer Neg Hx    Rectal cancer Neg Hx    Stomach cancer Neg Hx     Social History   Tobacco Use   Smoking status: Former   Smokeless tobacco: Never  Substance Use Topics   Alcohol use: Yes    Comment: Rare    Subjective:   Right flank pain x 5 days; no known injury or trauma; does remember feeling like she pulled her back in the kitchen the day before the onset of the symptoms; more noticeable with movement; no urinary urgency or burning; some relief with OTC Advil;    Objective:  Vitals:   01/06/21 0846  BP: 112/70  Pulse: 66  Temp: 97.6 F (36.4 C)  TempSrc: Oral  SpO2: 99%  Weight: 108 lb (49 kg)  Height: $Remove'5\' 4"'[id number]$  (1.626 m)    General: Well developed, well nourished, in no acute distress  Skin : Warm and dry.  Head: Normocephalic and atraumatic  Eyes: Sclera and conjunctiva clear; pupils round and reactive to light; extraocular movements intact  Ears: External normal; canals clear; tympanic membranes normal  Oropharynx: Pink, supple. No suspicious lesions  Neck: Supple without thyromegaly, adenopathy  Lungs: Respirations  unlabored;  Musculoskeletal: No deformities; no active joint inflammation  Extremities: No edema, cyanosis, clubbing  Vessels: Symmetric bilaterally  Neurologic: Alert and oriented; speech intact; face symmetrical; uses rolling walker  Assessment:  1. Right flank pain     Plan:  Suspect muscular; check urine culture due to history of UTIs with MS but no urinary symptoms noted; trial of Mobic and Tizanidine; apply heat and rest; follow up worse, no better.   This visit occurred during the SARS-CoV-2 public health emergency.  Safety protocols were in place, including screening questions prior to the visit, additional usage of staff PPE, and extensive cleaning of exam room while observing appropriate contact time as indicated for disinfecting solutions.    No follow-ups on file.  Orders Placed This Encounter   Procedures   Urine Culture   Comp Met (CMET)   POCT urinalysis dipstick    Requested Prescriptions   Signed Prescriptions Disp Refills   meloxicam (MOBIC) 15 MG tablet 30 tablet 0    Sig: Take 1 tablet (15 mg total) by mouth daily.   tiZANidine (ZANAFLEX) 2 MG tablet 30 tablet 0    Sig: Take 1 tablet (2 mg total) by mouth at bedtime.

## 2021-01-07 LAB — URINE CULTURE
MICRO NUMBER:: 12799468
Result:: NO GROWTH
SPECIMEN QUALITY:: ADEQUATE

## 2021-01-08 ENCOUNTER — Ambulatory Visit: Payer: BC Managed Care – PPO

## 2021-01-13 ENCOUNTER — Ambulatory Visit: Payer: BC Managed Care – PPO

## 2021-02-09 ENCOUNTER — Other Ambulatory Visit: Payer: Self-pay | Admitting: Pharmacy Technician

## 2021-02-09 ENCOUNTER — Encounter: Payer: Self-pay | Admitting: Family

## 2021-02-16 ENCOUNTER — Encounter: Payer: Self-pay | Admitting: Neurology

## 2021-02-17 ENCOUNTER — Other Ambulatory Visit: Payer: Self-pay

## 2021-02-17 DIAGNOSIS — G35 Multiple sclerosis: Secondary | ICD-10-CM

## 2021-02-17 MED ORDER — PREDNISONE 50 MG PO TABS: ORAL_TABLET | ORAL | 0 refills | Status: DC

## 2021-02-17 NOTE — Progress Notes (Signed)
Per Dr.Jaffe, prescribe a high-dose prednisone taper - 50mg  tablet:  10 tablets daily for 3 days, then 8 tablets on day 4, then 6 tablets on day 5, then 4 tablets on day 6, then 2 tablets on day 7, then 1 tablet on day 8, then 1/2 tablet on day 9, the STOP.        I also want to check MRI of brain and cervical spine with and without contrast to evaluate for new/progression of MS.

## 2021-03-18 ENCOUNTER — Other Ambulatory Visit: Payer: Self-pay | Admitting: Neurology

## 2021-03-18 MED ORDER — DIAZEPAM 5 MG PO TABS: ORAL_TABLET | ORAL | 0 refills | Status: DC

## 2021-03-22 ENCOUNTER — Ambulatory Visit
Admission: RE | Admit: 2021-03-22 | Discharge: 2021-03-22 | Disposition: A | Discharge status: Home / Self Care | Payer: BC Managed Care – PPO | Source: Ambulatory Visit | Attending: Neurology | Admitting: Neurology

## 2021-03-22 ENCOUNTER — Other Ambulatory Visit: Payer: Self-pay

## 2021-03-22 DIAGNOSIS — G35 Multiple sclerosis: Secondary | ICD-10-CM

## 2021-03-22 IMAGING — MR MR HEAD WO/W CM
13 series · 48 of 48 positions shown · IV contrast (multihance)
Comparison: Prior MRI from [DATE].

CLINICAL DATA: Follow-up examination for multiple sclerosis.

EXAM:
MRI HEAD WITHOUT AND WITH CONTRAST
TECHNIQUE: Multiplanar, multiecho pulse sequences of the brain and surrounding
structures were obtained without and with intravenous contrast.
CONTRAST:  9mL MULTIHANCE GADOBENATE DIMEGLUMINE 529 MG/ML IV SOLN

[Series 3: T1 · sagittal · 5.0mm · 0.47mm/px · 2 of 24 slices shown]
[im 1/24]
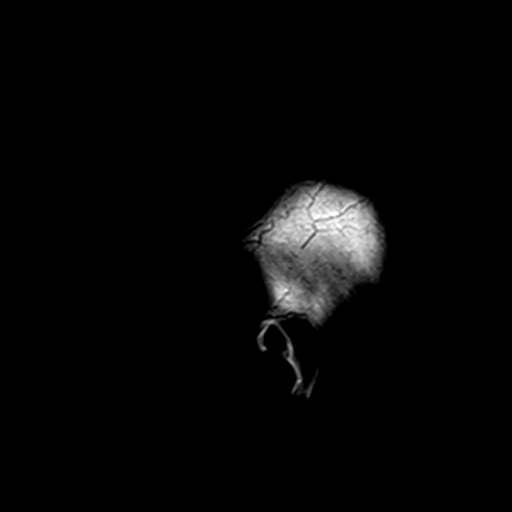
[im 24/24]
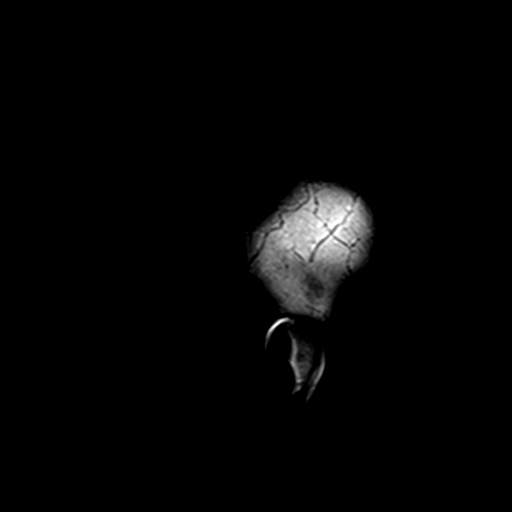

[Series 4: DWI · axial · 3.0mm · 1.88mm/px · z∈[-44,+111]mm · 7 of 108 slices shown (1 of 4)]
[im 1/108]
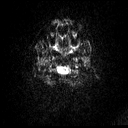
[im 18/108]
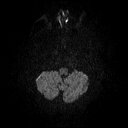
[im 36/108]
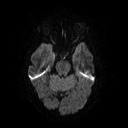
[im 54/108]
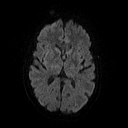
[im 72/108]
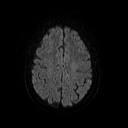
[im 90/108]
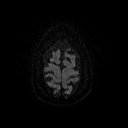
[im 108/108]
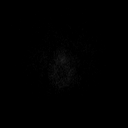

[Series 5: DWI · axial · 3.0mm · 1.88mm/px · z∈[-44,+111]mm · 3 of 49 slices shown (2 of 4)]
[im 1/49]
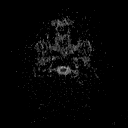
[im 25/49]
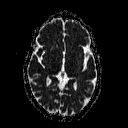
[im 49/49]
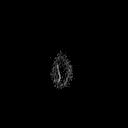

[Series 6: DWI · coronal · 5.0mm · 1.80mm/px · 4 of 70 slices shown (3 of 4)]
[im 1/70]
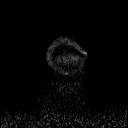
[im 24/70]
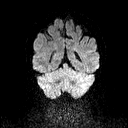
[im 47/70]
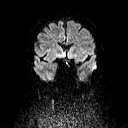
[im 70/70]
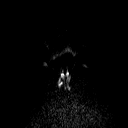

[Series 7: DWI · coronal · 5.0mm · 1.80mm/px · 2 of 37 slices shown (4 of 4)]
[im 1/37]
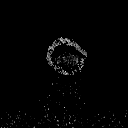
[im 37/37]
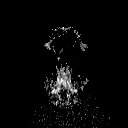

[Series 8: T2 · axial · 5.0mm · 0.60mm/px · 1 of 24 slices shown (1 of 2)]
[im 1/24]
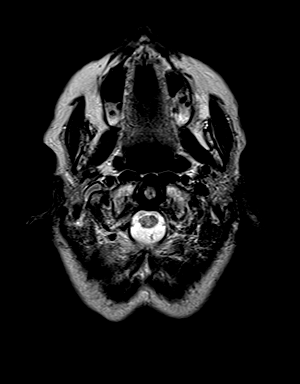

[Series 9: FLAIR · axial · 3.0mm · 0.45mm/px · z∈[-45,+109]mm · 2 of 35 slices shown (1 of 2)]
[im 1/35]
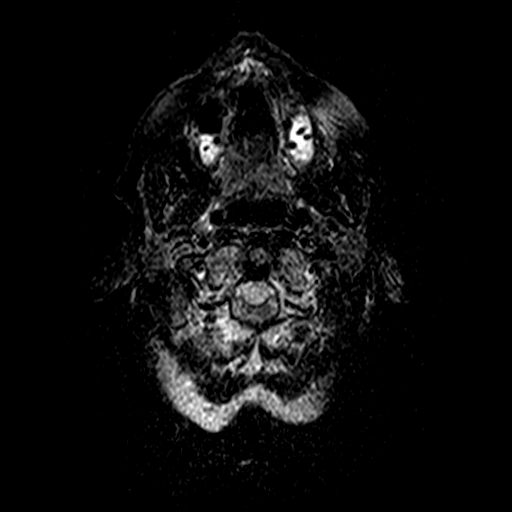
[im 35/35]
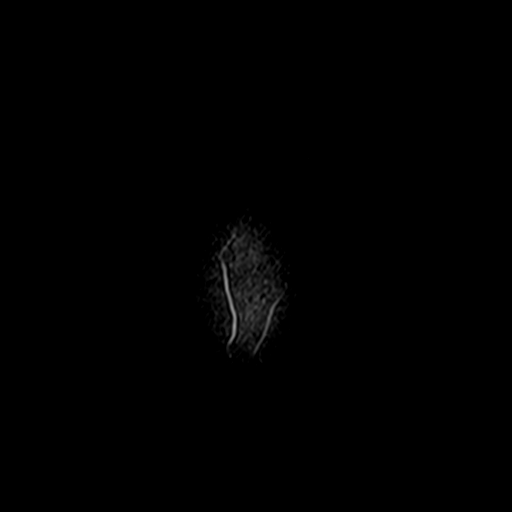

[Series 11: swi_images · axial · 4.0mm · 0.94mm/px · z∈[-43,+109]mm · 2 of 40 slices shown]
[im 1/40]
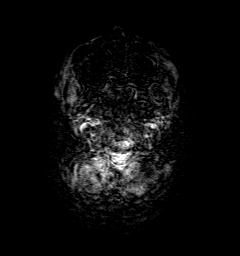
[im 40/40]
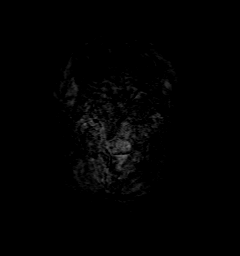

[Series 12: t1_mpr_tra · axial · 1.0mm · 0.75mm/px · z∈[-46,+109]mm · 10 of 160 slices shown]
[im 1/160]
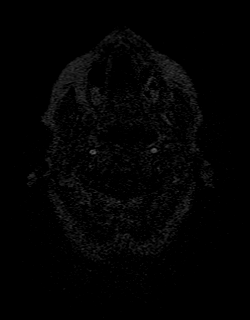
[im 18/160]
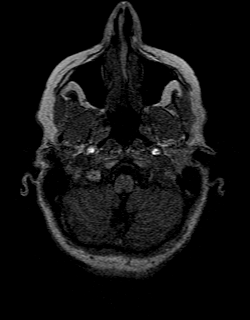
[im 36/160]
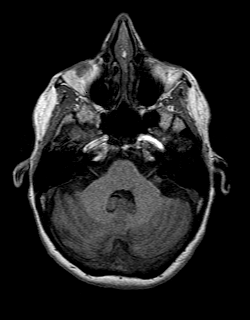
[im 54/160]
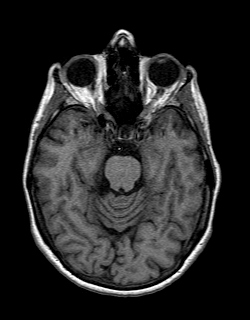
[im 71/160]
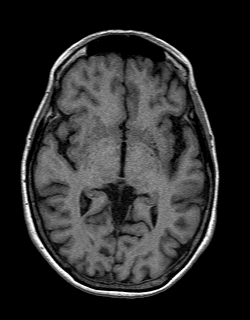
[im 89/160]
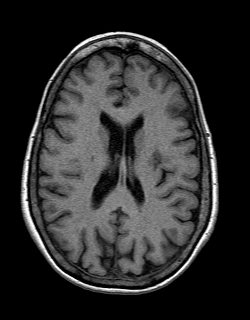
[im 107/160]
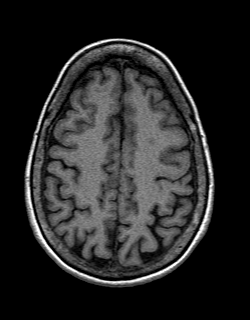
[im 124/160]
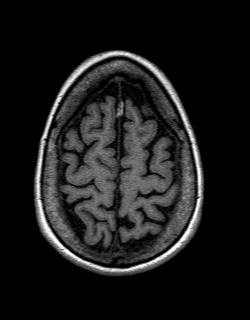
[im 142/160]
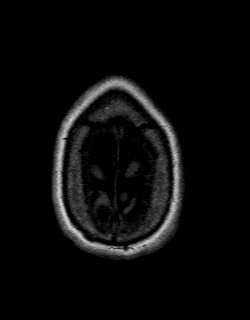
[im 160/160]
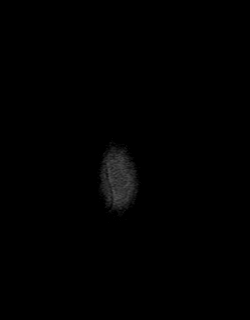

[Series 13: FLAIR · sagittal · 5.0mm · 0.45mm/px · 1 of 24 slices shown (2 of 2)]
[im 1/24]
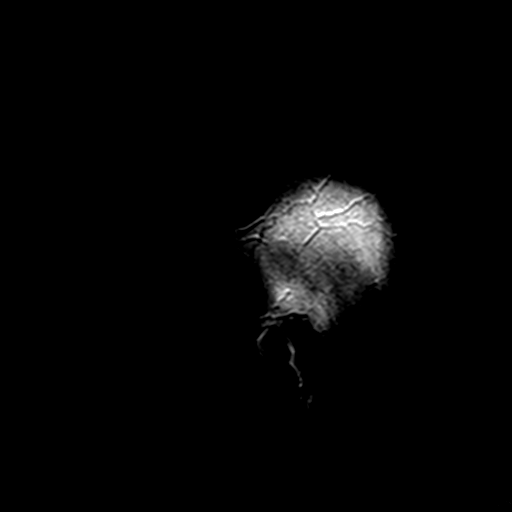

[Series 14: T2 · coronal · 5.0mm · 0.45mm/px · 2 of 29 slices shown (2 of 2)]
[im 1/29]
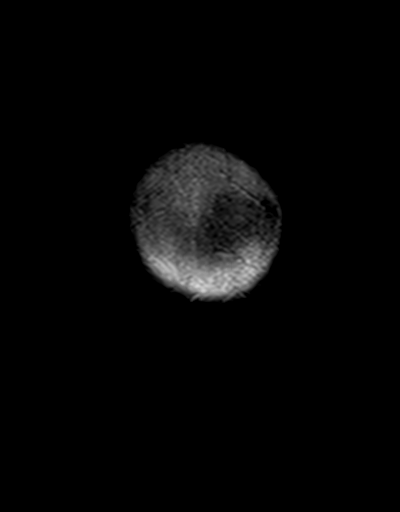
[im 29/29]
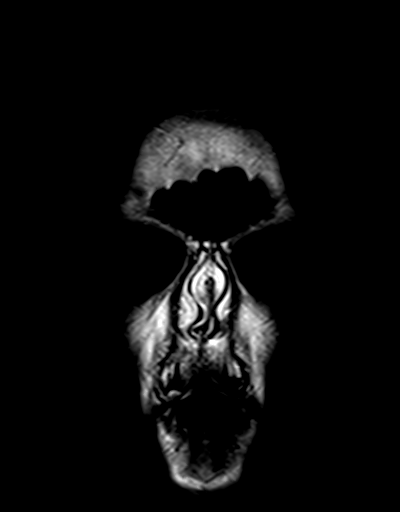

[Series 15: t1_mpr_tra post · axial · 1.0mm · 0.75mm/px · z∈[-46,+109]mm · 10 of 160 slices shown]
[im 1/160]
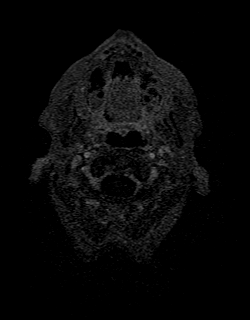
[im 18/160]
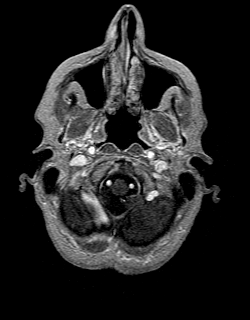
[im 36/160]
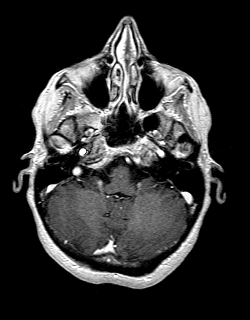
[im 54/160]
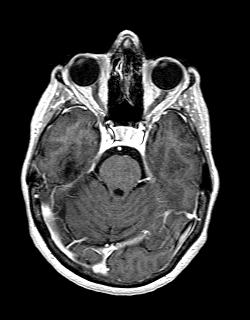
[im 71/160]
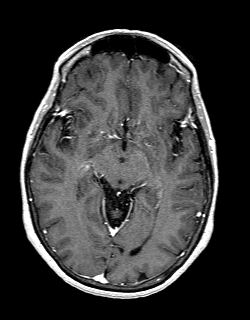
[im 89/160]
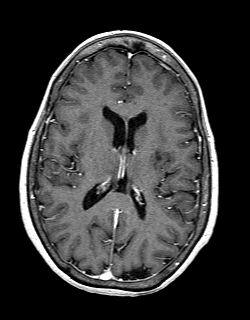
[im 107/160]
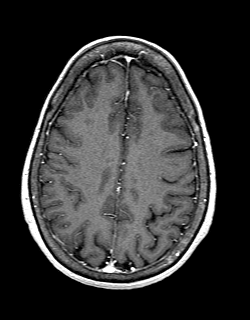
[im 124/160]
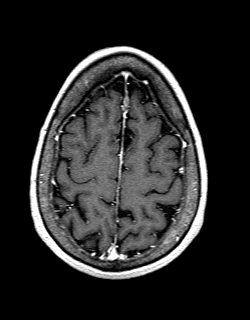
[im 142/160]
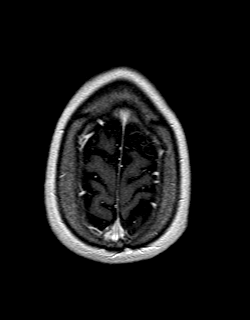
[im 160/160]
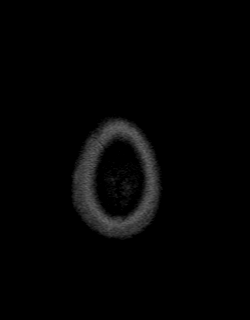

[Series 16: post cor post · coronal · 5.0mm · 0.45mm/px · 2 of 29 slices shown]
[im 1/29]
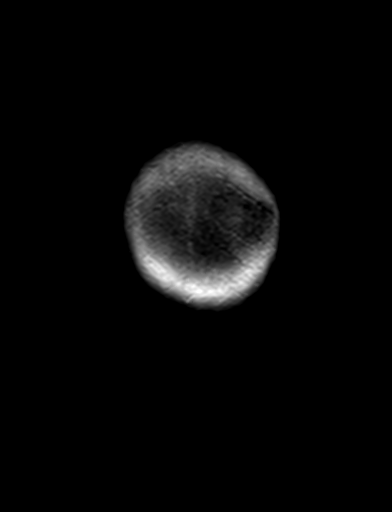
[im 29/29]
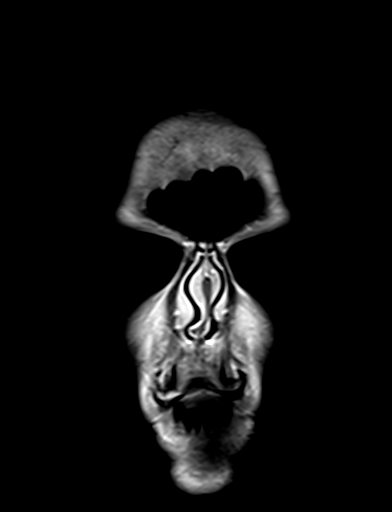

[48 of 48 positions shown; findings below may reference images not displayed]

FINDINGS: Brain: Cerebral volume stable, and remains within normal limits.
Again seen are scattered patchy multifocal areas of T2/FLAIR
hyperintensity involving the periventricular, deep, and juxta
cortical white matter both cerebral hemispheres. Patchy involvement
of the infratentorial brain at the middle cerebellar peduncles
bilaterally. Greatest focus of involvement present at the left
lentiform nucleus. Findings presumably related to history of chronic
multiple sclerosis. Overall, appearance is relatively stable from
most recent MRI, with no new foci to suggest interval progression of
disease. No abnormal enhancement to suggest active demyelination.

No evidence for acute or subacute ischemia. Gray-white matter
differentiation maintained. No encephalomalacia to suggest chronic
cortical infarction. No acute or chronic intracranial blood
products.

No mass lesion, midline shift or mass effect. No hydrocephalus or
extra-axial fluid collection. Pituitary gland suprasellar region
normal. Midline structures intact. No other abnormal enhancement.

Vascular: Major intracranial vascular flow voids are maintained.

Skull and upper cervical spine: Craniocervical junction within
normal limits. Bone marrow signal intensity within normal limits. No
scalp soft tissue abnormality.

Sinuses/Orbits: Globes orbital soft tissues within normal limits.
Paranasal sinuses are largely clear. No mastoid effusion. Inner ear
structures grossly normal.

Other: None.
IMPRESSION: 1. Scattered foci of T2/FLAIR hyperintense lesions involving the
supratentorial and infratentorial brain, consistent with history of
chronic demyelinating disease/multiple sclerosis. Overall,
appearance as compared to most recent brain MRI from [DATE] is
relatively stable without evidence for significant interval disease
progression. No evidence for active demyelination.
2. No other acute intracranial abnormality.

## 2021-03-22 IMAGING — MR MR CERVICAL SPINE WO/W CM
6 of 8 series · 32 of 48 positions shown · IV contrast (9ml Multihance)
Comparison: Prior MRI from [DATE].

CLINICAL DATA: Follow-up examination for multiple sclerosis.

EXAM:
MRI CERVICAL SPINE WITHOUT AND WITH CONTRAST
TECHNIQUE: Multiplanar and multiecho pulse sequences of the cervical spine, to
include the craniocervical junction and cervicothoracic junction,
were obtained without and with intravenous contrast.
CONTRAST:  9mL MULTIHANCE GADOBENATE DIMEGLUMINE 529 MG/ML IV SOLN

[Series 2: T1 · sagittal · 3.0mm · 0.82mm/px · 5 of 15 slices shown (1 of 2)]
[im 1/15]
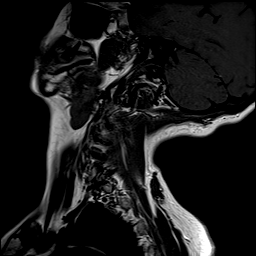
[im 4/15]
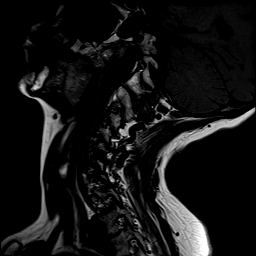
[im 8/15]
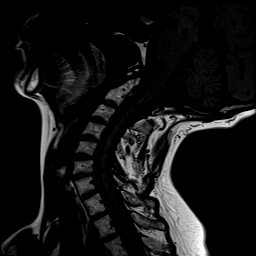
[im 11/15]
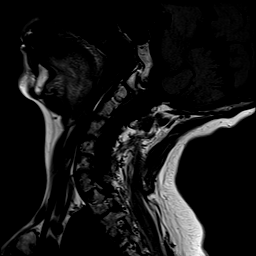
[im 15/15]
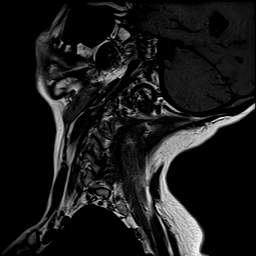

[Series 3: STIR · sagittal · 3.0mm · 0.82mm/px · 3 of 15 slices shown]
[im 1/15]
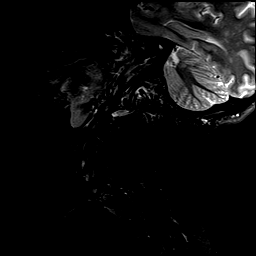
[im 4/15]
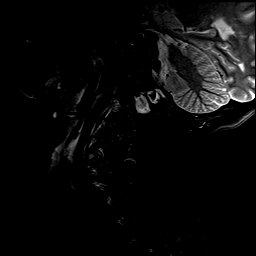
[im 8/15]
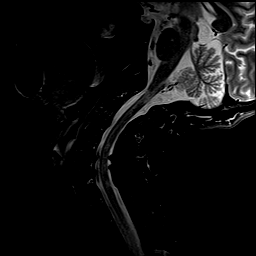

[Series 4: T2 · axial · 3.0mm · 0.70mm/px · z∈[-210,-132]mm · 7 of 24 slices shown (1 of 2)]
[im 1/24]
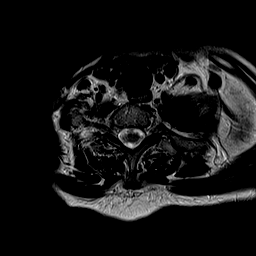
[im 4/24]
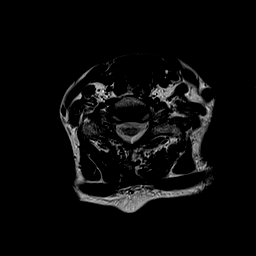
[im 8/24]
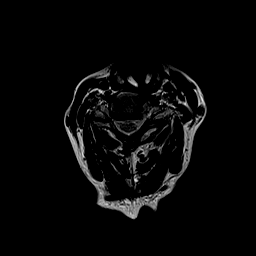
[im 12/24]
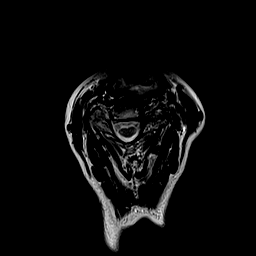
[im 16/24]
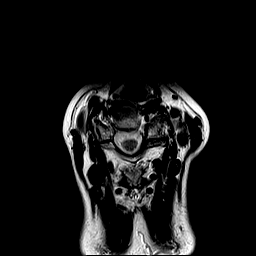
[im 20/24]
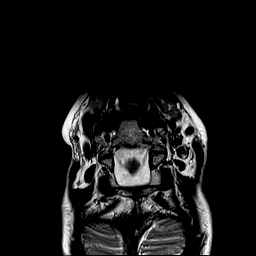
[im 24/24]
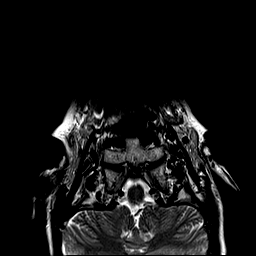

[Series 6: T1 · axial · non-contrast · 3.0mm · 0.35mm/px · z∈[-210,-132]mm · 7 of 24 slices shown (2 of 2)]
[im 1/24]
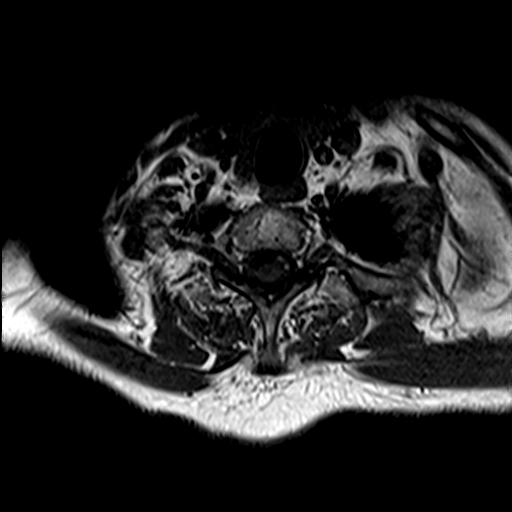
[im 4/24]
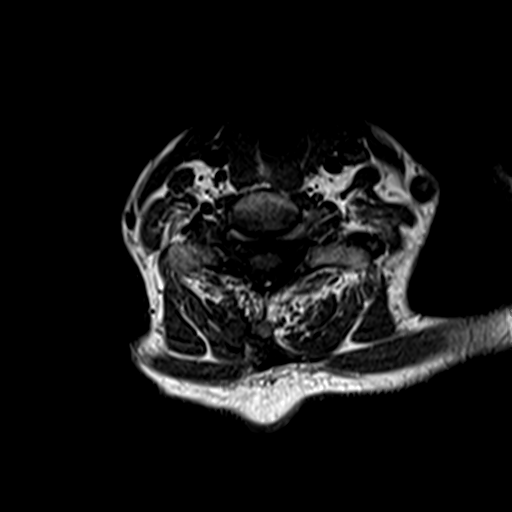
[im 8/24]
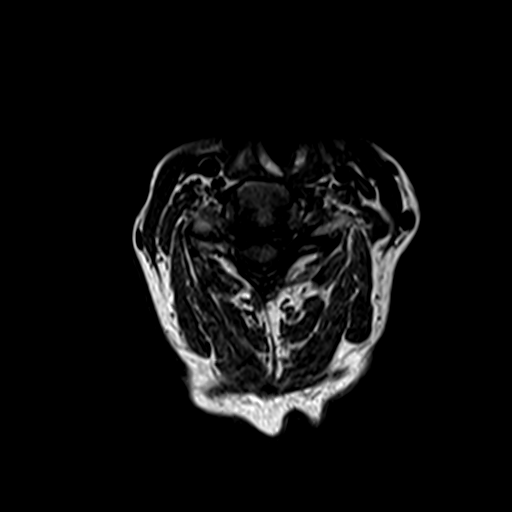
[im 12/24]
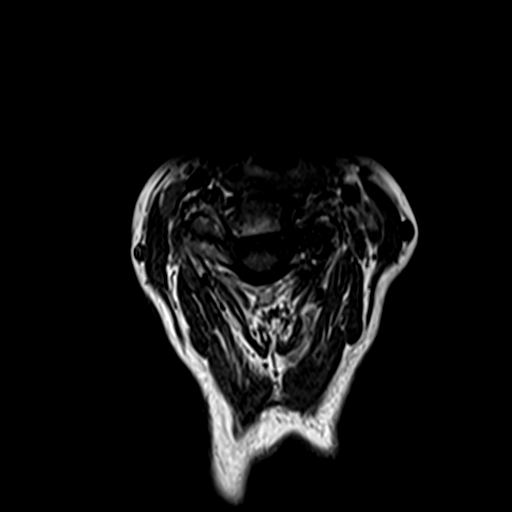
[im 16/24]
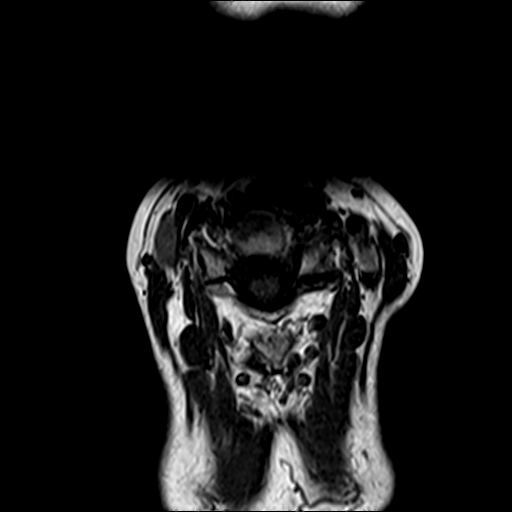
[im 20/24]
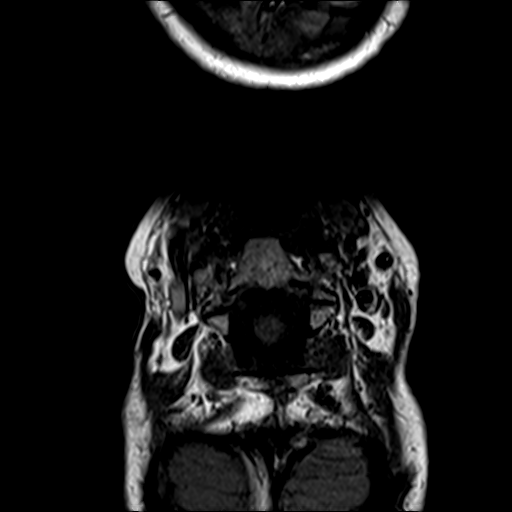
[im 24/24]
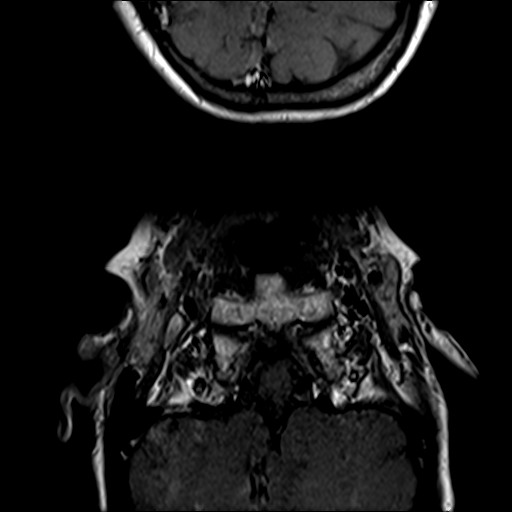

[Series 7: T2 · sagittal · 3.0mm · 0.41mm/px · 5 of 15 slices shown (2 of 2)]
[im 1/15]
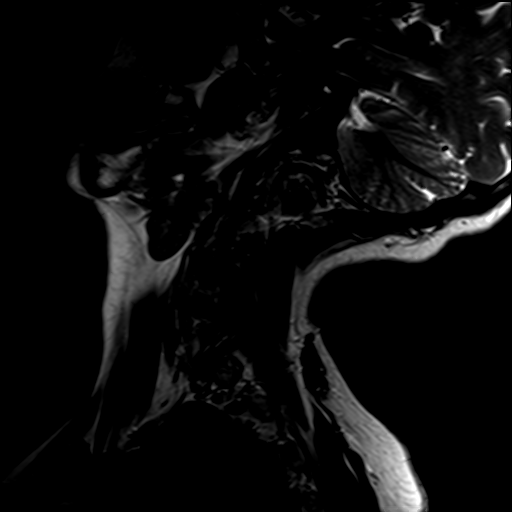
[im 4/15]
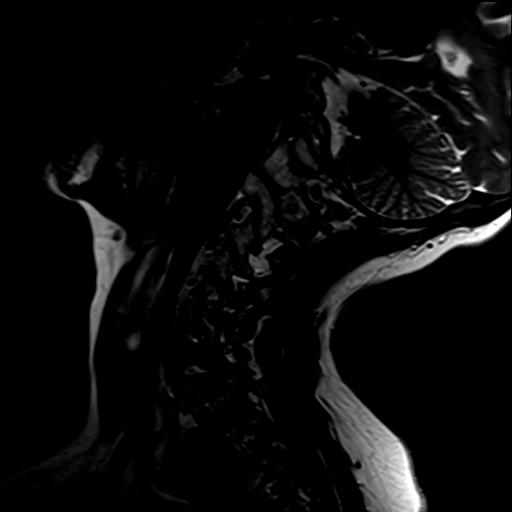
[im 8/15]
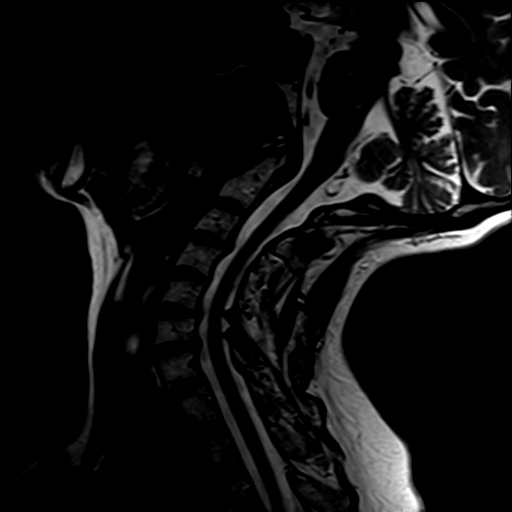
[im 11/15]
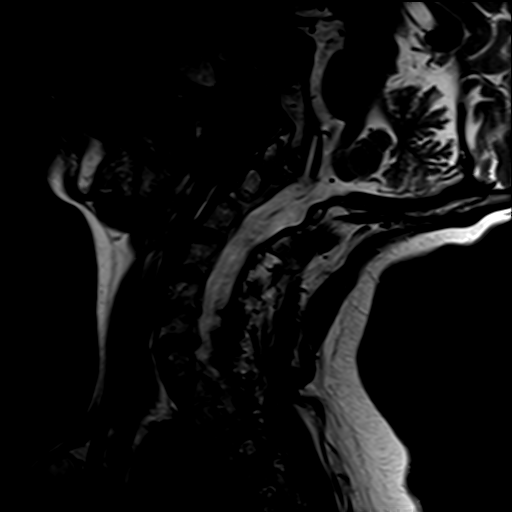
[im 15/15]
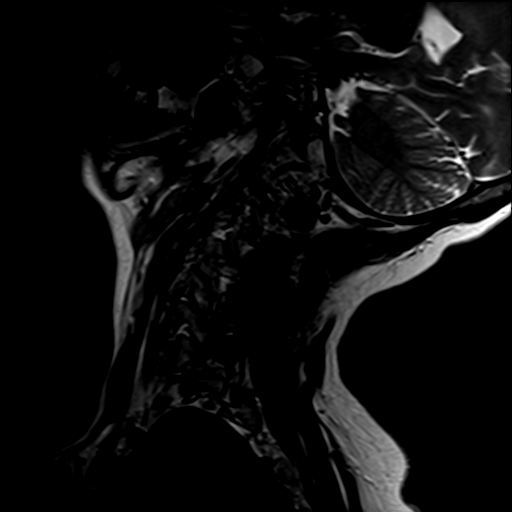

[Series 8: T1 fat-sat post-contrast · sagittal · 3.0mm · 0.82mm/px · 5 of 15 slices shown]
[im 1/15]
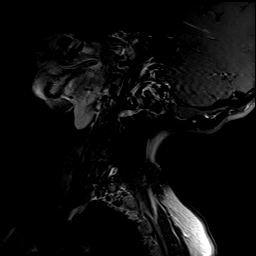
[im 4/15]
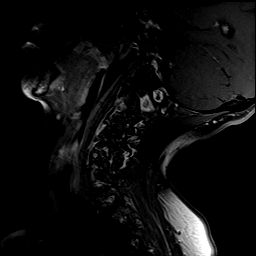
[im 8/15]
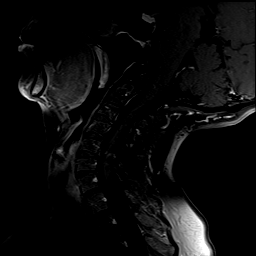
[im 11/15]
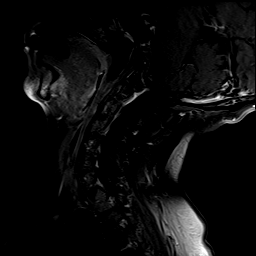
[im 15/15]
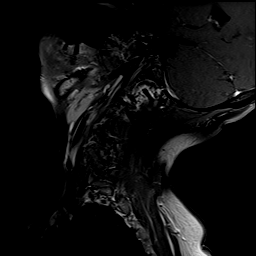

[32 of 48 positions shown; findings below may reference images not displayed]

FINDINGS: Alignment: Exaggeration of the normal cervical lordosis. No
listhesis.

Vertebrae: Vertebral body height maintained without acute or chronic
fracture. Bone marrow signal intensity within normal limits. No
discrete or worrisome osseous lesions. No abnormal marrow edema or
enhancement.

Cord: Again seen is patchy multifocal signal abnormality throughout
the cervical spinal cord. Changes extend from the cervicomedullary
junction to approximately C7-T1, with fairly diffuse involvement of
the cord at these levels. In comparison with previous study and
allowing for differences in technique, overall appearance is likely
not significantly changed or progressed. No abnormal enhancement to
suggest active demyelination.

Posterior Fossa, vertebral arteries, paraspinal tissues:
Craniocervical junction within normal limits. Paraspinous soft
tissues within normal limits. Normal flow voids seen within the
vertebral arteries bilaterally.

Disc levels:

C2-C3: Unremarkable.

C3-C4: Left eccentric disc osteophyte complex with associated
left-sided facet arthrosis. No spinal stenosis. Severe left C4
foraminal narrowing. Right neural foramen remains patent.

C4-C5: Minimal uncovertebral hypertrophy without significant disc
bulge. Left greater than right facet arthrosis. No spinal stenosis.
Mild right greater than left C5 foraminal narrowing.

C5-C6: Disc bulge with endplate and uncovertebral spurring. Moderate
bilateral facet arthrosis. No significant spinal stenosis. Mild to
moderate bilateral foraminal narrowing.

C6-C7: Degenerative intervertebral disc space narrowing. Broad
posterior disc osteophyte flattens and partially faces the ventral
thecal sac. Mild facet hypertrophy. No significant spinal stenosis.
Right greater than left uncovertebral hypertrophy with resultant
moderate right worse than left C7 foraminal stenosis.

C7-T1: Negative interspace. Right worse than left facet arthrosis.
No spinal stenosis. Mild right C8 foraminal narrowing. Left neural
foramen remains patent.

Visualized upper thoracic spine demonstrates no significant finding.
IMPRESSION: 1. Patchy multifocal signal abnormality throughout the cervical
spinal cord, consistent with history of multiple sclerosis. Overall,
appearance is not significantly changed or progressed as compared to
most recent MRI from [DATE]. No evidence for active
demyelination.
2. Underlying multilevel cervical spondylosis as detailed above,
also stable.

## 2021-03-22 MED ORDER — GADOBENATE DIMEGLUMINE 529 MG/ML IV SOLN
9.0000 mL | Freq: Once | INTRAVENOUS | Status: AC | PRN
  Administered 2021-03-22: 9 mL via INTRAVENOUS

## 2021-04-30 ENCOUNTER — Ambulatory Visit: Payer: BC Managed Care – PPO | Admitting: Neurology

## 2021-05-04 ENCOUNTER — Encounter: Payer: Self-pay | Admitting: Neurology

## 2021-05-05 ENCOUNTER — Other Ambulatory Visit: Payer: Self-pay

## 2021-05-05 DIAGNOSIS — G35 Multiple sclerosis: Secondary | ICD-10-CM

## 2021-05-05 NOTE — Progress Notes (Signed)
Per Dr.Jaffe okay to order a Light weight Wheelchair ?

## 2021-05-21 ENCOUNTER — Encounter: Payer: Self-pay | Admitting: Family

## 2021-06-29 ENCOUNTER — Encounter: Payer: Self-pay | Admitting: Family

## 2021-06-29 NOTE — Telephone Encounter (Signed)
Called Pt appointment was made

## 2021-06-30 ENCOUNTER — Telehealth: Payer: BC Managed Care – PPO | Admitting: Family Medicine

## 2021-07-15 ENCOUNTER — Encounter: Payer: Self-pay | Admitting: Neurology

## 2021-07-16 ENCOUNTER — Telehealth: Payer: Self-pay

## 2021-07-16 NOTE — Telephone Encounter (Signed)
infusion timeframes changed to 3-5 hours. Per patient her last infusion was 2 hours and she felt like she did not tolerate it well.   Spoke to [place] at [employer] infusion time frame changed.  Patient advised she should be set for her infusion on 07/22/21.

## 2021-07-17 ENCOUNTER — Other Ambulatory Visit: Payer: Self-pay | Admitting: Neurology

## 2021-07-17 MED ORDER — PROMETHAZINE HCL 25 MG PO TABS
25.0000 mg | ORAL_TABLET | Freq: Four times a day (QID) | ORAL | 3 refills | Status: DC | PRN
Start: 2021-07-17 — End: 2023-02-08

## 2021-07-21 ENCOUNTER — Ambulatory Visit: Payer: BC Managed Care – PPO | Admitting: Neurology

## 2021-08-26 ENCOUNTER — Encounter: Payer: Self-pay | Admitting: Neurology

## 2021-10-05 ENCOUNTER — Encounter: Payer: Self-pay | Admitting: Family

## 2021-11-06 ENCOUNTER — Telehealth: Payer: Self-pay

## 2021-11-06 NOTE — Telephone Encounter (Signed)
Telephone call to patient, received paperwork from [employer] for new order for Ocrevus,. Per Chart patient seen at [facility].  Per Patient she no longer felt like her health wasn't  "feeling it with Dr.Jaffe" so her Orthopedist referred her to dr.Exkstein.   Will send form back with message patient no longer seeing Dr.Jaffe.

## 2021-11-20 ENCOUNTER — Ambulatory Visit: Payer: BC Managed Care – PPO | Admitting: Neurology

## 2022-01-11 HISTORY — PX: CARPAL TUNNEL RELEASE: SHX101

## 2022-02-23 ENCOUNTER — Encounter: Payer: Self-pay | Admitting: Family

## 2022-02-23 ENCOUNTER — Ambulatory Visit (INDEPENDENT_AMBULATORY_CARE_PROVIDER_SITE_OTHER): Payer: BC Managed Care – PPO | Admitting: Family

## 2022-02-23 VITALS — BP 112/64 | Ht 64.0 in | Wt 102.6 lb

## 2022-02-23 DIAGNOSIS — G35 Multiple sclerosis: Secondary | ICD-10-CM

## 2022-02-23 DIAGNOSIS — Z0000 Encounter for general adult medical examination without abnormal findings: Secondary | ICD-10-CM | POA: Diagnosis not present

## 2022-02-23 DIAGNOSIS — Z13220 Encounter for screening for lipoid disorders: Secondary | ICD-10-CM | POA: Diagnosis not present

## 2022-02-23 MED ORDER — TRIAMCINOLONE ACETONIDE 0.1 % EX CREA: 1.0000 | TOPICAL_CREAM | Freq: Two times a day (BID) | CUTANEOUS | 0 refills | Status: DC

## 2022-02-23 MED ORDER — NITROFURANTOIN MONOHYD MACRO 100 MG PO CAPS
100.0000 mg | ORAL_CAPSULE | Freq: Two times a day (BID) | ORAL | 1 refills | Status: AC
Start: 2022-02-23 — End: ?

## 2022-02-23 NOTE — Progress Notes (Signed)
Jessica Salazar is a 57 y.o. female with the following history as recorded in EpicCare:  Patient Active Problem List   Diagnosis Date Noted   Dysesthesia 06/04/2016   Urinary frequency 06/04/2016   Insomnia 06/04/2016   Greater trochanteric bursitis of right hip 06/10/2015   Gait abnormality 12/30/2014   Multiple sclerosis ([place]) 09/25/2010    Current Outpatient Medications  Medication Sig Dispense Refill   Cholecalciferol (VITAMIN D) 50 MCG (2000 UT) CAPS 3,000 Units.     dalfampridine 10 MG TB12 Take by mouth.     meclizine (ANTIVERT) 25 MG tablet Take 1 tablet (25 mg total) by mouth 3 (three) times daily as needed for dizziness. 30 tablet 0   Multiple Vitamins-Minerals (MULTIVITAMIN GUMMIES ADULT PO) Take by mouth. Olly gummy multivitamin 2 po daily     Ocrelizumab (OCREVUS IV) Inject into the vein. Infusion 2 times a year     promethazine (PHENERGAN) 25 MG tablet Take 1 tablet (25 mg total) by mouth every 6 (six) hours as needed for nausea or vomiting. 30 tablet 3   triamcinolone cream (KENALOG) 0.1 % Apply 1 Application topically 2 (two) times daily. 45 g 0   nitrofurantoin, macrocrystal-monohydrate, (MACROBID) 100 MG capsule Take 1 capsule (100 mg total) by mouth 2 (two) times daily. 14 capsule 1   No current facility-administered medications for this visit.    Allergies: Patient has no known allergies.  Past Medical History:  Diagnosis Date   Arthritis    Constipation    due to MS   Heart murmur    MVP   History of recurrent UTIs    Hypotension    Mitral valve prolapse    Multiple sclerosis (HCC)    Neuromuscular disorder (HCC)    has /NMS stimulator    Vision abnormalities     Past Surgical History:  Procedure Laterality Date   COLONOSCOPY     last > 10 yrs ago in [place] - done for constipation   MENISCUS REPAIR Right    right knee replacement Right 2016   right TOtal knee relacement    UPPER GASTROINTESTINAL ENDOSCOPY      Family History  Problem Relation  Age of Onset   Heart attack Father    Down syndrome Sister    Healthy Child    Colon polyps Neg Hx    Colon cancer Neg Hx    Esophageal cancer Neg Hx    Rectal cancer Neg Hx    Stomach cancer Neg Hx     Social History   Tobacco Use   Smoking status: Former   Smokeless tobacco: Never  Substance Use Topics   Alcohol use: Yes    Comment: Rare    Subjective:   Presents for yearly CPE; now having MS managed by provider in [place]; feels like symptoms are progressively worsening- more noticeable on her right side; Overdue for her mammogram- planning to call to get scheduled; Difficulty sleeping;   Review of Systems  Constitutional: Negative.   HENT: Negative.    Eyes: Negative.   Respiratory: Negative.    Cardiovascular: Negative.   Gastrointestinal: Negative.   Genitourinary: Negative.   Musculoskeletal: Negative.   Skin: Negative.   Neurological: Negative.   Endo/Heme/Allergies: Negative.   Psychiatric/Behavioral:  The patient has insomnia.         Objective:  Vitals:   02/23/22 1317  BP: 112/64  Weight: 102 lb 9.6 oz (46.5 kg)  Height: 5' 4"$  (1.626 m)    General: Well  developed, well nourished, in no acute distress  Skin : Warm and dry.  Head: Normocephalic and atraumatic  Eyes: Sclera and conjunctiva clear; pupils round and reactive to light; extraocular movements intact  Ears: External normal; canals clear; tympanic membranes normal  Oropharynx: Pink, supple. No suspicious lesions  Neck: Supple without thyromegaly, adenopathy  Lungs: Respirations unlabored; clear to auscultation bilaterally without wheeze, rales, rhonchi  CVS exam: normal rate and regular rhythm.  Abdomen: Soft; nontender; nondistended; normoactive bowel sounds; no masses or hepatosplenomegaly  Musculoskeletal: No deformities; no active joint inflammation  Extremities: No edema, cyanosis, clubbing  Vessels: Symmetric bilaterally  Neurologic: Alert and oriented; speech intact; face  symmetrical; moves all extremities well; CNII-XII intact without focal deficit   Assessment:  1. PE (physical exam), annual   2. Lipid screening   3. Multiple sclerosis ([place])     Plan:  Age appropriate preventive healthcare needs addressed; encouraged regular eye doctor and dental exams; encouraged regular exercise; will update labs and refills as needed today; follow-up to be determined; She will plan to reach out to her neurologist about worsening weakness/ insomnia- suspect secondary to MS complications;    No follow-ups on file.  Orders Placed This Encounter  Procedures   CBC with Differential/Platelet   Comp Met (CMET)   Lipid panel   TSH    Requested Prescriptions   Signed Prescriptions Disp Refills   triamcinolone cream (KENALOG) 0.1 % 45 g 0    Sig: Apply 1 Application topically 2 (two) times daily.   nitrofurantoin, macrocrystal-monohydrate, (MACROBID) 100 MG capsule 14 capsule 1    Sig: Take 1 capsule (100 mg total) by mouth 2 (two) times daily.

## 2022-02-24 LAB — TSH: TSH: 0.83 u[IU]/mL (ref 0.35–5.50)

## 2022-02-24 LAB — COMPREHENSIVE METABOLIC PANEL
ALT: 8 U/L (ref 0–35)
AST: 15 U/L (ref 0–37)
Albumin: 4.4 g/dL (ref 3.5–5.2)
Alkaline Phosphatase: 88 U/L (ref 39–117)
BUN: 14 mg/dL (ref 6–23)
CO2: 30 mEq/L (ref 19–32)
Calcium: 9.7 mg/dL (ref 8.4–10.5)
Chloride: 106 mEq/L (ref 96–112)
Creatinine, Ser: 0.48 mg/dL (ref 0.40–1.20)
GFR: 104.17 mL/min (ref >60.00)
Glucose, Bld: 74 mg/dL (ref 70–99)
Potassium: 4 mEq/L (ref 3.5–5.1)
Sodium: 144 mEq/L (ref 135–145)
Total Bilirubin: 1.4 mg/dL — ABNORMAL HIGH (ref 0.2–1.2)
Total Protein: 6.7 g/dL (ref 6.0–8.3)

## 2022-02-24 LAB — CBC WITH DIFFERENTIAL/PLATELET
Basophils Absolute: 0.1 10*3/uL (ref 0.0–0.1)
Basophils Relative: 0.9 % (ref 0.0–3.0)
Eosinophils Absolute: 0.2 10*3/uL (ref 0.0–0.7)
Eosinophils Relative: 2.2 % (ref 0.0–5.0)
HCT: 39.1 % (ref 36.0–46.0)
Hemoglobin: 13.1 g/dL (ref 12.0–15.0)
Lymphocytes Relative: 18 % (ref 12.0–46.0)
Lymphs Abs: 1.3 10*3/uL (ref 0.7–4.0)
MCHC: 33.4 g/dL (ref 30.0–36.0)
MCV: 91.1 fl (ref 78.0–100.0)
Monocytes Absolute: 0.9 10*3/uL (ref 0.1–1.0)
Monocytes Relative: 12.1 % — ABNORMAL HIGH (ref 3.0–12.0)
Neutro Abs: 4.7 10*3/uL (ref 1.4–7.7)
Neutrophils Relative %: 66.8 % (ref 43.0–77.0)
Platelets: 272 10*3/uL (ref 150.0–400.0)
RBC: 4.29 Mil/uL (ref 3.87–5.11)
RDW: 13.6 % (ref 11.5–15.5)
WBC: 7.1 10*3/uL (ref 4.0–10.5)

## 2022-02-24 LAB — LIPID PANEL
Cholesterol: 199 mg/dL (ref 0–200)
HDL: 67.9 mg/dL (ref >39.00)
LDL Cholesterol: 112 mg/dL — ABNORMAL HIGH (ref 0–99)
NonHDL: 131.5
Total CHOL/HDL Ratio: 3
Triglycerides: 97 mg/dL (ref 0.0–149.0)
VLDL: 19.4 mg/dL (ref 0.0–40.0)

## 2022-04-21 ENCOUNTER — Encounter: Payer: Self-pay | Admitting: Family

## 2022-04-22 ENCOUNTER — Other Ambulatory Visit: Payer: Self-pay | Admitting: Family

## 2022-04-22 DIAGNOSIS — G5601 Carpal tunnel syndrome, right upper limb: Secondary | ICD-10-CM

## 2023-01-07 ENCOUNTER — Encounter: Payer: BC Managed Care – PPO | Admitting: Family

## 2023-02-04 ENCOUNTER — Encounter: Payer: BC Managed Care – PPO | Admitting: Family

## 2023-02-07 ENCOUNTER — Ambulatory Visit: Payer: Self-pay | Admitting: Family

## 2023-02-07 ENCOUNTER — Telehealth: Payer: Self-pay | Admitting: Family

## 2023-02-07 NOTE — Telephone Encounter (Unsigned)
Copied from CRM [id number]. Topic: Clinical - Pink Word Triage >> Feb 07, 2023  8:45 AM Fredrich Romans wrote: Reason for Triage: frequent urination,fatigue

## 2023-02-07 NOTE — Telephone Encounter (Signed)
  Chief Complaint: Urinary frequency Symptoms: Frequency, urgency, lethargy Frequency: Ongoing since yesterday Pertinent Negatives: Patient denies fever, flank pain, hematuria, dysuria Disposition: [] ED /[] Urgent Care (no appt availability in office) / [] Appointment(In office/virtual)/ []  [facility]/ [] Home Care/ [x] Refused Recommended Disposition /[] [facility]/ [x]  Follow-up with PCP Additional Notes: Pt reports she has MS and experiences frequent UTIs. She notes that beginning yesterday she began experiencing urinary frequency, urgency, lethargy. She denies dysuria, hematuria,  abd/flank pain. Pt notes she has an appt Friday and she takes routine antibiotics. Pt is asking if provider feels she needs a stronger antibiotic, pt did not want to schedule OV at this time but advises she will come in prior to Friday in provider deems it appropriate. This RN educated pt on home care, new-worsening symptoms, when to call back/seek emergent care. Pt verbalized understanding and agrees to plan.  Routing to provider for review.   Reason for Disposition  Urinating more frequently than usual (i.e., frequency)  Answer Assessment - Initial Assessment Questions 1. SYMPTOM: "What's the main symptom you're concerned about?" (e.g., frequency, incontinence)     Urinary frequency 2. ONSET: "When did the  symptoms  start?"     Last week 3. PAIN: "Is there any pain?" If Yes, ask: "How bad is it?" (Scale: 1-10; mild, moderate, severe)     None 4. CAUSE: "What do you think is causing the symptoms?"     Pt has MS and has frequent UTIs 5. OTHER SYMPTOMS: "Do you have any other symptoms?" (e.g., blood in urine, fever, flank pain, pain with urination)     Lethargic, urinary urgency  Protocols used: Urinary Symptoms-A-AH

## 2023-02-08 ENCOUNTER — Ambulatory Visit (INDEPENDENT_AMBULATORY_CARE_PROVIDER_SITE_OTHER): Payer: Self-pay | Admitting: Family

## 2023-02-08 ENCOUNTER — Encounter: Payer: Self-pay | Admitting: Family

## 2023-02-08 ENCOUNTER — Other Ambulatory Visit: Payer: Self-pay | Admitting: Family

## 2023-02-08 VITALS — BP 112/64 | HR 75 | Ht 64.0 in | Wt 99.0 lb

## 2023-02-08 DIAGNOSIS — Z0000 Encounter for general adult medical examination without abnormal findings: Secondary | ICD-10-CM

## 2023-02-08 DIAGNOSIS — M1990 Unspecified osteoarthritis, unspecified site: Secondary | ICD-10-CM

## 2023-02-08 DIAGNOSIS — Z13220 Encounter for screening for lipoid disorders: Secondary | ICD-10-CM

## 2023-02-08 DIAGNOSIS — Z860100 Personal history of colon polyps, unspecified: Secondary | ICD-10-CM

## 2023-02-08 DIAGNOSIS — Z1231 Encounter for screening mammogram for malignant neoplasm of breast: Secondary | ICD-10-CM

## 2023-02-08 DIAGNOSIS — R899 Unspecified abnormal finding in specimens from other organs, systems and tissues: Secondary | ICD-10-CM

## 2023-02-08 DIAGNOSIS — R300 Dysuria: Secondary | ICD-10-CM

## 2023-02-08 LAB — LIPID PANEL
Cholesterol: 202 mg/dL — ABNORMAL HIGH (ref 0–200)
HDL: 81.9 mg/dL (ref >39.00)
LDL Cholesterol: 104 mg/dL — ABNORMAL HIGH (ref 0–99)
NonHDL: 119.65
Total CHOL/HDL Ratio: 2
Triglycerides: 80 mg/dL (ref 0.0–149.0)
VLDL: 16 mg/dL (ref 0.0–40.0)

## 2023-02-08 LAB — CBC WITH DIFFERENTIAL/PLATELET
Basophils Absolute: 0.1 10*3/uL (ref 0.0–0.1)
Basophils Relative: 0.5 % (ref 0.0–3.0)
Eosinophils Absolute: 0.1 10*3/uL (ref 0.0–0.7)
Eosinophils Relative: 0.9 % (ref 0.0–5.0)
HCT: 40.6 % (ref 36.0–46.0)
Hemoglobin: 13.4 g/dL (ref 12.0–15.0)
Lymphocytes Relative: 12.5 % (ref 12.0–46.0)
Lymphs Abs: 1.4 10*3/uL (ref 0.7–4.0)
MCHC: 33.1 g/dL (ref 30.0–36.0)
MCV: 91.8 fL (ref 78.0–100.0)
Monocytes Absolute: 1 10*3/uL (ref 0.1–1.0)
Monocytes Relative: 8.7 % (ref 3.0–12.0)
Neutro Abs: 8.6 10*3/uL — ABNORMAL HIGH (ref 1.4–7.7)
Neutrophils Relative %: 77.4 % — ABNORMAL HIGH (ref 43.0–77.0)
Platelets: 245 10*3/uL (ref 150.0–400.0)
RBC: 4.42 Mil/uL (ref 3.87–5.11)
RDW: 12.9 % (ref 11.5–15.5)
WBC: 11 10*3/uL — ABNORMAL HIGH (ref 4.0–10.5)

## 2023-02-08 LAB — COMPREHENSIVE METABOLIC PANEL
ALT: 9 U/L (ref 0–35)
AST: 17 U/L (ref 0–37)
Albumin: 4.7 g/dL (ref 3.5–5.2)
Alkaline Phosphatase: 94 U/L (ref 39–117)
BUN: 20 mg/dL (ref 6–23)
CO2: 31 meq/L (ref 19–32)
Calcium: 9.8 mg/dL (ref 8.4–10.5)
Chloride: 107 meq/L (ref 96–112)
Creatinine, Ser: 0.52 mg/dL (ref 0.40–1.20)
GFR: 101.5 mL/min (ref >60.00)
Glucose, Bld: 90 mg/dL (ref 70–99)
Potassium: 4.5 meq/L (ref 3.5–5.1)
Sodium: 146 meq/L — ABNORMAL HIGH (ref 135–145)
Total Bilirubin: 1.2 mg/dL (ref 0.2–1.2)
Total Protein: 6.8 g/dL (ref 6.0–8.3)

## 2023-02-08 MED ORDER — MELOXICAM 15 MG PO TABS: 15.0000 mg | ORAL_TABLET | Freq: Every day | ORAL | 0 refills | Status: DC

## 2023-02-08 MED ORDER — CIPROFLOXACIN HCL 250 MG PO TABS: 250.0000 mg | ORAL_TABLET | Freq: Two times a day (BID) | ORAL | 0 refills | Status: AC

## 2023-02-08 MED ORDER — PROMETHAZINE HCL 25 MG PO TABS: 25.0000 mg | ORAL_TABLET | Freq: Four times a day (QID) | ORAL | 3 refills | Status: AC | PRN

## 2023-02-08 NOTE — Progress Notes (Signed)
Jessica Salazar is a 58 y.o. female with the following history as recorded in EpicCare:  Patient Active Problem List   Diagnosis Date Noted   Dysesthesia 06/04/2016   Urinary frequency 06/04/2016   Insomnia 06/04/2016   Greater trochanteric bursitis of right hip 06/10/2015   Gait abnormality 12/30/2014   Multiple sclerosis (HCC) 09/25/2010    Current Outpatient Medications  Medication Sig Dispense Refill   Cholecalciferol (VITAMIN D) 50 MCG (2000 UT) CAPS 3,000 Units.     ciprofloxacin (CIPRO) 250 MG tablet Take 1 tablet (250 mg total) by mouth 2 (two) times daily for 5 days. 10 tablet 0   dalfampridine 10 MG TB12 Take by mouth.     gabapentin (NEURONTIN) 100 MG capsule Take 100 mg by mouth 2 (two) times daily.     meloxicam (MOBIC) 15 MG tablet Take 1 tablet (15 mg total) by mouth daily. 30 tablet 0   Multiple Vitamins-Minerals (MULTIVITAMIN GUMMIES ADULT PO) Take by mouth. Olly gummy multivitamin 2 po daily     nitrofurantoin, macrocrystal-monohydrate, (MACROBID) 100 MG capsule Take 1 capsule (100 mg total) by mouth 2 (two) times daily. 14 capsule 1   Ocrelizumab (OCREVUS IV) Inject into the vein. Infusion 2 times a year     promethazine (PHENERGAN) 25 MG tablet Take 1 tablet (25 mg total) by mouth every 6 (six) hours as needed for nausea or vomiting. 30 tablet 3   No current facility-administered medications for this visit.    Allergies: Patient has no known allergies.  Past Medical History:  Diagnosis Date   Arthritis    Constipation    due to MS   Heart murmur    MVP   History of recurrent UTIs    Hypotension    Mitral valve prolapse    Multiple sclerosis (HCC)    Neuromuscular disorder (HCC)    has /NMS stimulator    Vision abnormalities     Past Surgical History:  Procedure Laterality Date   COLONOSCOPY     last > 10 yrs ago in [place] - done for constipation   MENISCUS REPAIR Right    right knee replacement Right 2016   right TOtal knee relacement    UPPER  GASTROINTESTINAL ENDOSCOPY      Family History  Problem Relation Age of Onset   Heart attack Father    Down syndrome Sister    Healthy Child    Colon polyps Neg Hx    Colon cancer Neg Hx    Esophageal cancer Neg Hx    Rectal cancer Neg Hx    Stomach cancer Neg Hx     Social History   Tobacco Use   Smoking status: Former   Smokeless tobacco: Never  Substance Use Topics   Alcohol use: Yes    Comment: Rare    Subjective:   Presents today for yearly CPE; accompanied by her husband;  Is concerned that not responding to [facility] for suspected UTI; would like to try Cipro if possible; due to mobility issues, very difficult to get sample in the office;  Needs to be scheduled for mammogram/ colonoscopy;  Up to date with eye doctor; overdue to see dentist;  Scheduled to meet with her neurologist next week; concern that may have had MS flare recently;   Review of Systems  Constitutional:  Positive for weight loss.  HENT: Negative.    Eyes: Negative.   Respiratory: Negative.    Cardiovascular: Negative.   Gastrointestinal: Negative.   Genitourinary: Negative.  Musculoskeletal: Negative.   Skin: Negative.   Neurological: Negative.   Endo/Heme/Allergies: Negative.   Psychiatric/Behavioral: Negative.       Objective:  Vitals:   02/08/23 1112  BP: 112/64  Pulse: 75  SpO2: 98%  Weight: 99 lb (44.9 kg)  Height: 5\' 4"  (1.626 m)    General: Well developed, well nourished, in no acute distress  Skin : Warm and dry.  Head: Normocephalic and atraumatic  Eyes: Sclera and conjunctiva clear; pupils round and reactive to light; extraocular movements intact  Ears: External normal; canals clear; tympanic membranes normal  Oropharynx: Pink, supple. No suspicious lesions  Neck: Supple without thyromegaly, adenopathy  Lungs: Respirations unlabored; clear to auscultation bilaterally without wheeze, rales, rhonchi  CVS exam: normal rate and regular rhythm.  Abdomen: Soft; nontender;  nondistended; normoactive bowel sounds; no masses or hepatosplenomegaly  Musculoskeletal: No deformities; no active joint inflammation  Extremities: No edema, cyanosis, clubbing  Vessels: Symmetric bilaterally  Neurologic: Alert and oriented; speech intact; face symmetrical; moves all extremities well; CNII-XII intact without focal deficit   Assessment:  1. PE (physical exam), annual   2. Visit for screening mammogram   3. Osteoarthritis, unspecified osteoarthritis type, unspecified site   4. Encounter for colonoscopy due to history of colonic polyp   5. Lipid screening     Plan:  Age appropriate preventive healthcare needs addressed; encouraged regular eye doctor and dental exams; encouraged regular exercise; will update labs and refills as needed today; follow-up to be determined; Discussed need to work on extra calorie intake to help with weight gain- would like to see patient gain 15 pounds over the next 3-6 months;  Order for colonoscopy, mammogram; to rheumatology to discuss arthritis; trial of Mobic 15 mg prn for hand pain;   Will treat UTI with Cipro 250 mg bid x 3-5 days; if symptoms persist, she will collect sample at home and drop off [facility] culture at [facility];   No follow-ups on file.  Orders Placed This Encounter  Procedures   MM Digital Screening    Standing Status:   Future    Expiration Date:   02/08/2024    Reason for Exam (SYMPTOM  OR DIAGNOSIS REQUIRED):   screening mammogram    Is the patient pregnant?:   No    Preferred imaging location?:   [facility]   CBC with Differential/Platelet   Comp Met (CMET)   Lipid panel   Ambulatory referral to Rheumatology    Referral Priority:   Routine    Referral Type:   Consultation    Referral Reason:   Specialty Services Required    Requested Specialty:   Rheumatology    Number of Visits Requested:   1   Ambulatory referral to Gastroenterology    Referral Priority:   Routine    Referral Type:   Consultation    Referral  Reason:   Specialty Services Required    Referred to Provider:   Mansouraty, Netty Starring., MD    Number of Visits Requested:   1    Requested Prescriptions   Signed Prescriptions Disp Refills   ciprofloxacin (CIPRO) 250 MG tablet 10 tablet 0    Sig: Take 1 tablet (250 mg total) by mouth 2 (two) times daily for 5 days.   promethazine (PHENERGAN) 25 MG tablet 30 tablet 3    Sig: Take 1 tablet (25 mg total) by mouth every 6 (six) hours as needed for nausea or vomiting.   meloxicam (MOBIC) 15 MG tablet 30 tablet  0    Sig: Take 1 tablet (15 mg total) by mouth daily.

## 2023-02-11 ENCOUNTER — Ambulatory Visit: Payer: BC Managed Care – PPO | Admitting: Family

## 2023-02-11 ENCOUNTER — Encounter: Payer: BC Managed Care – PPO | Admitting: Family

## 2023-02-21 ENCOUNTER — Ambulatory Visit: Payer: 59

## 2023-02-24 ENCOUNTER — Other Ambulatory Visit: Payer: Self-pay | Admitting: Neurology

## 2023-02-24 DIAGNOSIS — G35 Multiple sclerosis: Secondary | ICD-10-CM

## 2023-03-02 NOTE — Progress Notes (Deleted)
 Office Visit Note  Patient: Jessica Salazar             Date of Birth: [birth year]           MRN: [id number]             PCP: Olive Bass, FNP Referring: Olive Bass,* Visit Date: 03/16/2023 Occupation: @GUAROCC @  Subjective:    History of Present Illness: Jessica Salazar is a 58 y.o. female who presents today for a new patient consultation.       Activities of Daily Living:  Patient reports morning stiffness for *** {minute/hour:19697}.   Patient {ACTIONS;DENIES/REPORTS:21021675::"Denies"} nocturnal pain.  Difficulty dressing/grooming: {ACTIONS;DENIES/REPORTS:21021675::"Denies"} Difficulty climbing stairs: {ACTIONS;DENIES/REPORTS:21021675::"Denies"} Difficulty getting out of chair: {ACTIONS;DENIES/REPORTS:21021675::"Denies"} Difficulty using hands for taps, buttons, cutlery, and/or writing: {ACTIONS;DENIES/REPORTS:21021675::"Denies"}  No Rheumatology ROS completed.   PMFS History:  Patient Active Problem List   Diagnosis Date Noted  . Dysesthesia 06/04/2016  . Urinary frequency 06/04/2016  . Insomnia 06/04/2016  . Greater trochanteric bursitis of right hip 06/10/2015  . Gait abnormality 12/30/2014  . Multiple sclerosis (HCC) 09/25/2010    Past Medical History:  Diagnosis Date  . Arthritis   . Constipation    due to MS  . Heart murmur    MVP  . History of recurrent UTIs   . Hypotension   . Mitral valve prolapse   . Multiple sclerosis (HCC)   . Neuromuscular disorder (HCC)    has /NMS stimulator   . Vision abnormalities     Family History  Problem Relation Age of Onset  . Heart attack Father   . Down syndrome Sister   . Healthy Child   . Colon polyps Neg Hx   . Colon cancer Neg Hx   . Esophageal cancer Neg Hx   . Rectal cancer Neg Hx   . Stomach cancer Neg Hx    Past Surgical History:  Procedure Laterality Date  . COLONOSCOPY     last > 10 yrs ago in [place] - done for constipation  . MENISCUS REPAIR Right   . right knee  replacement Right 2016   right TOtal knee relacement   . UPPER GASTROINTESTINAL ENDOSCOPY     Social History   Social History Narrative   Pt is single with boyfriends she has 1 child right handed   Lives in 1 story home   Drinks coffee daily, no tea rarely drinks soda   Immunization History  Administered Date(s) Administered  . Influenza Inj Mdck Quad Pf 10/20/2018  . Influenza Inj Mdck Quad With Preservative 10/11/2021  . Influenza Split 10/22/2014  . Influenza-Unspecified 10/22/2014  . PFIZER(Purple Top)SARS-COV-2 Vaccination 03/24/2019, 04/14/2019, 09/02/2019  . Tdap 09/08/2015     Objective: Vital Signs: There were no vitals taken for this visit.   Physical Exam Vitals and nursing note reviewed.  Constitutional:      Appearance: She is well-developed.  HENT:     Head: Normocephalic and atraumatic.  Eyes:     Conjunctiva/sclera: Conjunctivae normal.  Cardiovascular:     Rate and Rhythm: Normal rate and regular rhythm.     Heart sounds: Normal heart sounds.  Pulmonary:     Effort: Pulmonary effort is normal.     Breath sounds: Normal breath sounds.  Abdominal:     General: Bowel sounds are normal.     Palpations: Abdomen is soft.  Musculoskeletal:     Cervical back: Normal range of motion.  Lymphadenopathy:     Cervical: No cervical adenopathy.  Skin:    General: Skin is warm and dry.     Capillary Refill: Capillary refill takes less than 2 seconds.  Neurological:     Mental Status: She is alert and oriented to person, place, and time.  Psychiatric:        Behavior: Behavior normal.     Musculoskeletal Exam: ***  CDAI Exam: CDAI Score: -- Patient Global: --; Provider Global: -- Swollen: --; Tender: -- Joint Exam 03/16/2023   No joint exam has been documented for this visit   There is currently no information documented on the homunculus. Go to the Rheumatology activity and complete the homunculus joint exam.  Investigation: No additional  findings.  Imaging: No results found.  Recent Labs: Lab Results  Component Value Date   WBC 11.0 (H) 02/08/2023   HGB 13.4 02/08/2023   PLT 245.0 02/08/2023   NA 146 (H) 02/08/2023   K 4.5 02/08/2023   CL 107 02/08/2023   CO2 31 02/08/2023   GLUCOSE 90 02/08/2023   BUN 20 02/08/2023   CREATININE 0.52 02/08/2023   BILITOT 1.2 02/08/2023   ALKPHOS 94 02/08/2023   AST 17 02/08/2023   ALT 9 02/08/2023   PROT 6.8 02/08/2023   ALBUMIN 4.7 02/08/2023   CALCIUM 9.8 02/08/2023   QFTBGOLD Negative 06/04/2016    Speciality Comments: No specialty comments available.  Procedures:  No procedures performed Allergies: Patient has no known allergies.   Assessment / Plan:     Visit Diagnoses: Right hand pain - OA of hand  Multiple sclerosis (HCC)  Greater trochanteric bursitis of right hip  Gait abnormality  Other insomnia  Orders: No orders of the defined types were placed in this encounter.  No orders of the defined types were placed in this encounter.   Face-to-face time spent with patient was *** minutes. Greater than 50% of time was spent in counseling and coordination of care.  Follow-Up Instructions: No follow-ups on file.   Gearldine Bienenstock, PA-C  Note - This record has been created using Dragon software.  Chart creation errors have been sought, but may not always  have been located. Such creation errors do not reflect on  the standard of medical care.

## 2023-03-04 ENCOUNTER — Ambulatory Visit
Admission: RE | Admit: 2023-03-04 | Discharge: 2023-03-04 | Disposition: A | Discharge status: Home / Self Care | Payer: 59 | Source: Ambulatory Visit | Attending: Family | Admitting: Family

## 2023-03-04 DIAGNOSIS — Z1231 Encounter for screening mammogram for malignant neoplasm of breast: Secondary | ICD-10-CM

## 2023-03-16 ENCOUNTER — Encounter: Payer: 59 | Admitting: Physician Assistant

## 2023-03-16 DIAGNOSIS — R269 Unspecified abnormalities of gait and mobility: Secondary | ICD-10-CM

## 2023-03-16 DIAGNOSIS — G35 Multiple sclerosis: Secondary | ICD-10-CM

## 2023-03-16 DIAGNOSIS — M79641 Pain in right hand: Secondary | ICD-10-CM

## 2023-03-16 DIAGNOSIS — G4709 Other insomnia: Secondary | ICD-10-CM

## 2023-03-16 DIAGNOSIS — M7061 Trochanteric bursitis, right hip: Secondary | ICD-10-CM

## 2023-03-27 ENCOUNTER — Ambulatory Visit
Admission: RE | Admit: 2023-03-27 | Discharge: 2023-03-27 | Disposition: A | Discharge status: Home / Self Care | Payer: 59 | Source: Ambulatory Visit | Attending: Neurology | Admitting: Neurology

## 2023-03-27 DIAGNOSIS — G35 Multiple sclerosis: Secondary | ICD-10-CM

## 2023-03-27 MED ORDER — GADOPICLENOL 0.5 MMOL/ML IV SOLN
5.0000 mL | Freq: Once | INTRAVENOUS | Status: AC | PRN
  Administered 2023-03-27: 5 mL via INTRAVENOUS

## 2023-03-30 ENCOUNTER — Encounter: Payer: Self-pay | Admitting: Family

## 2023-03-30 ENCOUNTER — Ambulatory Visit: Payer: Self-pay

## 2023-03-30 NOTE — Telephone Encounter (Signed)
 Spoke to patient through MyChart- she was encouraged to either go to [facility]/C or schedule appointment tomorrow as recommended by triage nurse.

## 2023-03-30 NOTE — Telephone Encounter (Signed)
 Copied from CRM [id number]. Topic: Clinical - Red Word Triage >> Mar 30, 2023 12:24 PM Gurney Maxin H wrote: Red Word that prompted transfer to Nurse Triage: Had MRI with contrast done Sunday, now not feeling good and has a rash all over body except face and body feels warm, patient has MS as well.  Chief Complaint: widespread rash Symptoms: light pink widespread rash and hot to touch, itching on upper itch, mild headache for last hour Frequency: continual Pertinent Negatives: Patient denies bright red sunburn rash, fever, blisters/spots/peeling/scaly/raised areas, purple/blood-colored spots, sore throat, joint pain outside of normal, dizziness, joint swelling, SOB, chest pain, stiff neck Disposition: [] 911 / [] ED /[] Urgent Care (no appt availability in office) / [] Appointment(In office/virtual)/ []  [place] [place] [facility]/ [] Home Care/ [x] Refused Recommended Disposition /[] [place] [facility]/ [x]  Follow-up with PCP Additional Notes: Pt reporting concern for reaction from MRI contrast especially for potential interaction with her gabapentin or Impira with the MRI contrast. Pt reporting widespread "light pink" rash and "warm to touch" to both arms, upper back, chest, and abdomen, moderate itchiness across upper back only, no fever, first noticed rash yesterday, MRI was on Sunday. Pt confirms no other symptoms at this time. Advised pt be examined in next 4 hours for concerns, no availability with PCP, pt refusing to see any other provider than her PCP, sending HP message with pt request for call back from PCP. Advised pt call back if any worsening or new symptoms. Pt verbalized understanding.  Reason for Disposition  [1] Headache AND [2] no fever  Answer Assessment - Initial Assessment Questions 1. APPEARANCE of RASH: "Describe the rash." (e.g., spots, blisters, raised areas, skin peeling, scaly)     Just looks like out in sun too long, not raised, just warm to the touch, like when a baby gets  overheated, light pink 2. SIZE: "How big are the spots?" (e.g., tip of pen, eraser, coin; inches, centimeters)     No spots 3. LOCATION: "Where is the rash located?"     Not on face, up and down the arm, on the back, chest, abdomen, not on legs 4. COLOR: "What color is the rash?" (Note: It is difficult to assess rash color in people with darker-colored skin. When this situation occurs, simply ask the caller to describe what they see.)     Light pink 5. ONSET: "When did the rash begin?"     Noticed it just 2-3 hours ago, probably started last night, have MS, do get that itchy rash at night sometimes where legs feel weighted, last night back itching normally don't get, blew it off, this morning okay but as day gone on bothering me more and more 6. FEVER: "Do you have a fever?" If Yes, ask: "What is your temperature, how was it measured, and when did it start?"     no 7. ITCHING: "Does the rash itch?" If Yes, ask: "How bad is the itch?" (Scale 1-10; or mild, moderate, severe)     Itchy, back across the shoulders blades only, very sensitive area for her MS, annoying and apparent 5/10 8. CAUSE: "What do you think is causing the rash?"     Concern about recent MRI with contrast different kind of contrast I think 9. MEDICINE FACTORS: "Have you started any new medicines within the last 2 weeks?" (e.g., antibiotics)      May have been different type of contrast with MRI than usual, but last time had MRI not on med on now Impira to help with  walking, first time had MRI while taking this med, never had reaction to contrast before, never had MRI while on gabapentin either 10. OTHER SYMPTOMS: "Do you have any other symptoms?" (e.g., dizziness, headache, sore throat, joint pain)       Nothing outside of normal  Protocols used: Rash or Redness - [facility]

## 2023-04-04 NOTE — Progress Notes (Unsigned)
 Office Visit Note  Patient: Jessica Salazar             Date of Birth: [birth year]           MRN: [id number]             PCP: Olive Bass, FNP Referring: Olive Bass,* Visit Date: 04/08/2023 Occupation: @GUAROCC @  Subjective:  Right hand pain  History of Present Illness: Jessica Salazar is a 58 y.o. female who presents today for a new patient consultation.  Patient presents today for further evaluation of pain, inflammation, and stiffness involving the right hand for the past 6 months.  Patient states that about 7 months ago she had carpal tunnel release performed by Dr. Yehuda Budd first on her right wrist followed by the left wrist.  Patient states that the surgery was successful but about 1 month after surgery she has had difficulty using her right hand.  At night she wakes up with pain and stiffness and is unable to open her fist without assisting with her left hand.  When asked to rank the pain currently she states that most of her symptoms are numbness at this time.  She is right-hand dominant but has been unable to use her right hand to perform ADLs recently.  Patient states that she has been prescribed meloxicam by her PCP but she has not yet initiated it.  She takes Tylenol as needed for pain relief and has tried using Voltaren gel as needed.  Patient states she is also been experiencing discomfort on the right side of her lower back.  Patient has to sleep on her left side at night due to the discomfort. Patient previously had a right knee replacement performed about 9 years ago by Dr. Early Chars at [facility].  She experiences episodes of hyperextension at times but has not had any recent falls.  She recently had an episode of swelling and bruising of the right knee replacement with no identifiable trigger.  She had no fall prior to the onset of symptoms.  Her symptoms resolved within 2 days and have not recurred.  Patient is unaware of previous family history.  She denies any personal  history of Crohn's, ulcerative colitis, or psoriasis.  She denies any oral or nasal ulcerations.  She denies any sicca symptoms.  She has not had any symptoms of Raynaud's phenomenon but states that her right hand does remain cold on a daily basis.   Activities of Daily Living:  Patient reports morning stiffness for several hours.   Patient Reports nocturnal pain.  Difficulty dressing/grooming: Denies Difficulty climbing stairs: Reports Difficulty getting out of chair: Reports Difficulty using hands for taps, buttons, cutlery, and/or writing: Reports  Review of Systems  Constitutional:  Positive for fatigue.  HENT:  Negative for mouth sores, mouth dryness and nose dryness.   Eyes:  Negative for pain and dryness.  Respiratory:  Negative for shortness of breath and difficulty breathing.   Cardiovascular:  Negative for chest pain and palpitations.  Gastrointestinal:  Positive for constipation. Negative for blood in stool and diarrhea.  Endocrine: Negative for increased urination.  Genitourinary:  Negative for painful urination.  Musculoskeletal:  Positive for joint pain, joint pain, joint swelling, myalgias, muscle weakness, morning stiffness, muscle tenderness and myalgias.  Skin:  Negative for color change, rash, hair loss and sensitivity to sunlight.  Allergic/Immunologic: Positive for susceptible to infections.  Neurological:  Negative for dizziness and headaches.  Hematological:  Negative for swollen glands.  Psychiatric/Behavioral:  Positive for sleep disturbance. Negative for depressed mood. The patient is not nervous/anxious.     PMFS History:  Patient Active Problem List   Diagnosis Date Noted   Dysesthesia 06/04/2016   Urinary frequency 06/04/2016   Insomnia 06/04/2016   Greater trochanteric bursitis of right hip 06/10/2015   Gait abnormality 12/30/2014   Multiple sclerosis (HCC) 09/25/2010    Past Medical History:  Diagnosis Date   Arthritis    Constipation    due to  MS   Heart murmur    MVP   History of recurrent UTIs    Hypotension    Mitral valve prolapse    Multiple sclerosis (HCC)    Neuromuscular disorder (HCC)    has /NMS stimulator    Vision abnormalities     Family History  Problem Relation Age of Onset   Heart attack Father    Down syndrome Sister    Healthy Son    Colon polyps Neg Hx    Colon cancer Neg Hx    Esophageal cancer Neg Hx    Rectal cancer Neg Hx    Stomach cancer Neg Hx    Past Surgical History:  Procedure Laterality Date   CARPAL TUNNEL RELEASE Bilateral 2024   Dr. Yehuda Budd,   COLONOSCOPY     last > 10 yrs ago in [place] - done for constipation   MENISCUS REPAIR Right    right knee replacement Right 2016   right TOtal knee relacement    UPPER GASTROINTESTINAL ENDOSCOPY     Social History   Social History Narrative   Pt is single with boyfriends she has 1 child right handed   Lives in 1 story home   Drinks coffee daily, no tea rarely drinks soda   Immunization History  Administered Date(s) Administered   Influenza Inj Mdck Quad Pf 10/20/2018   Influenza Inj Mdck Quad With Preservative 10/11/2021   Influenza Split 10/22/2014   Influenza-Unspecified 10/22/2014   PFIZER(Purple Top)SARS-COV-2 Vaccination 03/24/2019, 04/14/2019, 09/02/2019   Tdap 09/08/2015     Objective: Vital Signs: BP 113/73 (BP Location: Right Arm, Patient Position: Sitting, Cuff Size: Normal)   Pulse 72   Resp 13   Ht 5\' 4"  (1.626 m)   Wt 100 lb 6.4 oz (45.5 kg)   BMI 17.23 kg/m    Physical Exam Vitals and nursing note reviewed.  Constitutional:      Appearance: She is well-developed.  HENT:     Head: Normocephalic and atraumatic.  Eyes:     Conjunctiva/sclera: Conjunctivae normal.  Cardiovascular:     Rate and Rhythm: Normal rate and regular rhythm.     Heart sounds: Normal heart sounds.  Pulmonary:     Effort: Pulmonary effort is normal.     Breath sounds: Normal breath sounds.  Abdominal:     General: Bowel sounds  are normal.     Palpations: Abdomen is soft.  Musculoskeletal:     Cervical back: Normal range of motion.  Lymphadenopathy:     Cervical: No cervical adenopathy.  Skin:    General: Skin is warm and dry.     Capillary Refill: Capillary refill takes less than 2 seconds.  Neurological:     Mental Status: She is alert and oriented to person, place, and time.  Psychiatric:        Behavior: Behavior normal.      Musculoskeletal Exam: Patient remained seated in the wheelchair during the examination today. C-spine has good ROM. Thoracic kyphosis.  Shoulder joints have  good ROM.  Elbow joints have good ROM with some tenderness over the right lateral epicondyle.  Wrist joints have good ROM with no tenderness or synovitis.  Tenderness across all MCPs of the right hand.  Limited extension of the right 4th PIP joint.  Complete fist formation bilaterally.  Hip joints difficult to assess in seated positions.  Right knee replacement has no warmth or effusion.  Thickening of the right ankle s/p previous fracture and edema noted. No tenderness or swelling of the left ankle.   CDAI Exam: CDAI Score: -- Patient Global: --; Provider Global: -- Swollen: --; Tender: -- Joint Exam 04/08/2023   No joint exam has been documented for this visit   There is currently no information documented on the homunculus. Go to the Rheumatology activity and complete the homunculus joint exam.  Investigation: No additional findings.  Imaging: XR Lumbar Spine 2-3 Views Result Date: 04/08/2023 Mild levoscoliosis was noted.  Mild L4-L5 spondylolisthesis was noted.  L4-L5 narrowing was noted.  Facet joint arthropathy was noted. Impression: These findings were suggestive of mild L4-L5 spondylolisthesis, spondylolisthesis and facet joint arthropathy.  XR Pelvis 1-2 Views Result Date: 04/08/2023 Generalized osteopenia was noted.  No SI joint narrowing with sclerosis was noted.  No hip joint narrowing was noted. Impression:  Unremarkable x-rays of the SI joints.  XR Hand 2 View Left Result Date: 04/08/2023 Generalized osteopenia was noted.  CMC, PIP and DIP narrowing was noted.  No MCP, intercarpal or radiocarpal joint space narrowing was noted.  No erosive changes were noted. Impression: These findings were suggestive of osteoarthritis.  Generalized osteopenia was noted.  XR Hand 2 View Right Result Date: 04/08/2023 Generalized osteopenia was noted.  CMC, PIP and DIP narrowing was noted.  No MCP, intercarpal or radiocarpal joint space narrowing was noted.  No erosive changes were noted. Impression: These findings were suggestive of osteoarthritis.  Generalized osteopenia was noted.   Recent Labs: Lab Results  Component Value Date   WBC 11.0 (H) 02/08/2023   HGB 13.4 02/08/2023   PLT 245.0 02/08/2023   NA 146 (H) 02/08/2023   K 4.5 02/08/2023   CL 107 02/08/2023   CO2 31 02/08/2023   GLUCOSE 90 02/08/2023   BUN 20 02/08/2023   CREATININE 0.52 02/08/2023   BILITOT 1.2 02/08/2023   ALKPHOS 94 02/08/2023   AST 17 02/08/2023   ALT 9 02/08/2023   PROT 6.8 02/08/2023   ALBUMIN 4.7 02/08/2023   CALCIUM 9.8 02/08/2023   QFTBGOLD Negative 06/04/2016    Speciality Comments: No specialty comments available.  Procedures:  No procedures performed Allergies: Patient has no known allergies.   Assessment / Plan:     Visit Diagnoses: Primary osteoarthritis of right hand - Referral reason- OA right hand.  11/29/22: ESR and CRP WNL.  Patient's been experiencing increased pain, stiffness, and difficulty using her right hand for the past 6 months.  She underwent carpal tunnel release by Dr. Yehuda Budd about 7 months ago with no complications.  She has been experiencing diffuse inflammation, tenderness across the MCP joints, and difficulty opening her fist at night.  She has been taking Tylenol as needed for symptomatic relief.  She uses Voltaren gel topically as needed.  On examination today no obvious synovitis was noted.   X-rays of both hands were obtained for further evaluation.  The following lab work will also be updated.  Pain in both hands - She presents today with numbness, pain, stiffness, and difficulty using her right hand x 6  months.  Of note the patient had carpal tunnel release performed bilaterally about 7 months ago by Dr. Yehuda Budd with no complications.  When asked to rank the pain in her right hand today she stated that her symptoms were consistent with numbness at this time.   She has not yet initiated meloxicam but takes Tylenol as needed for pain relief.  On examination she has limited extension in the right fourth PIP joint and tenderness of the right 1st through 5th MCP joints.  No synovitis or dactylitis noted.   No known personal or family history of any rheumatologic conditions.   X-rays of both hands were updated today.  The following lab work was also obtained for further evaluation.  Plan: XR Hand 2 View Right, XR Hand 2 View Left, Rheumatoid factor, Cyclic citrul peptide antibody, IgG, Mutated Citrullinated Vimentin (MCV) Antibody, Sedimentation rate, C-reactive protein, Uric acid  History of bilateral carpal tunnel release - 2024-Performed by Dr. Yehuda Budd. No complications.  Greater trochanteric bursitis of right hip - Not currently symptomatic. No tenderness upon palpation today.  S/P total knee arthroplasty, right - Dr. Eloise Levels orthopedics-9 yr ago. No effusion noted.   Chronic right SI joint pain -She presents today with discomfort in the right SI joint.  She experiences nocturnal pain in her lower back causing her to have to sleep on her left side at night.  On examination she has tenderness over the right SI joint.  X-rays of the lumbar spine and pelvis updated today. - Plan: XR Lumbar Spine 2-3 Views, XR Pelvis 1-2 Views  Other medical conditions are listed as follows:  Multiple sclerosis (HCC) - Under care of [facility] neurolog[facility]-treated with ocrelizumab since 2020.Prior therapies  include: Avonex, Rebif, Aubagio. MRI brain 03/27/23-results pending  Dysesthesia  Gait abnormality - Using wheelchair  Other insomnia - Interrupted sleep at night.  Orders: Orders Placed This Encounter  Procedures   XR Lumbar Spine 2-3 Views   XR Pelvis 1-2 Views   XR Hand 2 View Right   XR Hand 2 View Left   Rheumatoid factor   Cyclic citrul peptide antibody, IgG   Mutated Citrullinated Vimentin (MCV) Antibody   Sedimentation rate   C-reactive protein   Uric acid   No orders of the defined types were placed in this encounter.    Follow-Up Instructions: Return for NPFU.   Gearldine Bienenstock, PA-C  Note - This record has been created using Dragon software.  Chart creation errors have been sought, but may not always  have been located. Such creation errors do not reflect on  the standard of medical care.

## 2023-04-08 ENCOUNTER — Ambulatory Visit

## 2023-04-08 ENCOUNTER — Ambulatory Visit: Attending: Physician Assistant | Admitting: Physician Assistant

## 2023-04-08 ENCOUNTER — Encounter: Payer: Self-pay | Admitting: Physician Assistant

## 2023-04-08 VITALS — BP 113/73 | HR 72 | Resp 13 | Ht 64.0 in | Wt 100.4 lb

## 2023-04-08 DIAGNOSIS — M19041 Primary osteoarthritis, right hand: Secondary | ICD-10-CM

## 2023-04-08 DIAGNOSIS — Z9889 Other specified postprocedural states: Secondary | ICD-10-CM

## 2023-04-08 DIAGNOSIS — M79641 Pain in right hand: Secondary | ICD-10-CM

## 2023-04-08 DIAGNOSIS — M79642 Pain in left hand: Secondary | ICD-10-CM

## 2023-04-08 DIAGNOSIS — M533 Sacrococcygeal disorders, not elsewhere classified: Secondary | ICD-10-CM

## 2023-04-08 DIAGNOSIS — M7061 Trochanteric bursitis, right hip: Secondary | ICD-10-CM | POA: Diagnosis not present

## 2023-04-08 DIAGNOSIS — G4709 Other insomnia: Secondary | ICD-10-CM

## 2023-04-08 DIAGNOSIS — G35D Multiple sclerosis, unspecified: Secondary | ICD-10-CM

## 2023-04-08 DIAGNOSIS — G8929 Other chronic pain: Secondary | ICD-10-CM

## 2023-04-08 DIAGNOSIS — R269 Unspecified abnormalities of gait and mobility: Secondary | ICD-10-CM

## 2023-04-08 DIAGNOSIS — Z96651 Presence of right artificial knee joint: Secondary | ICD-10-CM

## 2023-04-08 DIAGNOSIS — G35 Multiple sclerosis: Secondary | ICD-10-CM

## 2023-04-08 DIAGNOSIS — R208 Other disturbances of skin sensation: Secondary | ICD-10-CM

## 2023-04-10 NOTE — Progress Notes (Signed)
 RF and anti-CCP negative  CRP and ESR WNL Uric acid WNL

## 2023-04-14 LAB — SEDIMENTATION RATE: Sed Rate: 6 mm/h (ref 0–30)

## 2023-04-14 LAB — C-REACTIVE PROTEIN: CRP: <3 mg/L (ref <8.0)

## 2023-04-14 LAB — URIC ACID: Uric Acid, Serum: 2.8 mg/dL (ref 2.5–7.0)

## 2023-04-14 LAB — RHEUMATOID FACTOR: Rheumatoid fact SerPl-aCnc: <10 [IU]/mL (ref <14)

## 2023-04-14 LAB — MUTATED CITRULLINATED VIMENTIN (MCV) ANTIBODY: MUTATED CITRULLINATED VIMENTIN (MCV) AB: <20 U/mL (ref <20)

## 2023-04-14 LAB — CYCLIC CITRUL PEPTIDE ANTIBODY, IGG: Cyclic Citrullin Peptide Ab: <16 U

## 2023-04-14 NOTE — Progress Notes (Signed)
 MCV negative

## 2023-04-27 NOTE — Progress Notes (Deleted)
 Office Visit Note  Patient: Jessica Salazar             Date of Birth: [birth year]           MRN: [id number]             PCP: Olive Bass, FNP Referring: Olive Bass,* Visit Date: 05/09/2023 Occupation: @GUAROCC @  Subjective:  No chief complaint on file.   History of Present Illness: Jessica Salazar is a 58 y.o. female ***     Activities of Daily Living:  Patient reports morning stiffness for *** {minute/hour:19697}.   Patient {ACTIONS;DENIES/REPORTS:21021675::"Denies"} nocturnal pain.  Difficulty dressing/grooming: {ACTIONS;DENIES/REPORTS:21021675::"Denies"} Difficulty climbing stairs: {ACTIONS;DENIES/REPORTS:21021675::"Denies"} Difficulty getting out of chair: {ACTIONS;DENIES/REPORTS:21021675::"Denies"} Difficulty using hands for taps, buttons, cutlery, and/or writing: {ACTIONS;DENIES/REPORTS:21021675::"Denies"}  No Rheumatology ROS completed.   PMFS History:  Patient Active Problem List   Diagnosis Date Noted   Dysesthesia 06/04/2016   Urinary frequency 06/04/2016   Insomnia 06/04/2016   Greater trochanteric bursitis of right hip 06/10/2015   Gait abnormality 12/30/2014   Multiple sclerosis (HCC) 09/25/2010    Past Medical History:  Diagnosis Date   Arthritis    Constipation    due to MS   Heart murmur    MVP   History of recurrent UTIs    Hypotension    Mitral valve prolapse    Multiple sclerosis (HCC)    Neuromuscular disorder (HCC)    has /NMS stimulator    Vision abnormalities     Family History  Problem Relation Age of Onset   Heart attack Father    Down syndrome Sister    Healthy Son    Colon polyps Neg Hx    Colon cancer Neg Hx    Esophageal cancer Neg Hx    Rectal cancer Neg Hx    Stomach cancer Neg Hx    Past Surgical History:  Procedure Laterality Date   CARPAL TUNNEL RELEASE Bilateral 2024   Dr. Yehuda Budd,   COLONOSCOPY     last > 10 yrs ago in [place] - done for constipation   MENISCUS REPAIR Right    right knee  replacement Right 2016   right TOtal knee relacement    UPPER GASTROINTESTINAL ENDOSCOPY     Social History   Social History Narrative   Pt is single with boyfriends she has 1 child right handed   Lives in 1 story home   Drinks coffee daily, no tea rarely drinks soda   Immunization History  Administered Date(s) Administered   Influenza Inj Mdck Quad Pf 10/20/2018   Influenza Inj Mdck Quad With Preservative 10/11/2021   Influenza Split 10/22/2014   Influenza-Unspecified 10/22/2014   PFIZER(Purple Top)SARS-COV-2 Vaccination 03/24/2019, 04/14/2019, 09/02/2019   Tdap 09/08/2015     Objective: Vital Signs: There were no vitals taken for this visit.   Physical Exam   Musculoskeletal Exam: ***  CDAI Exam: CDAI Score: -- Patient Global: --; Provider Global: -- Swollen: --; Tender: -- Joint Exam 05/09/2023   No joint exam has been documented for this visit   There is currently no information documented on the homunculus. Go to the Rheumatology activity and complete the homunculus joint exam.  Investigation: No additional findings.  Imaging: XR Lumbar Spine 2-3 Views Result Date: 04/08/2023 Mild levoscoliosis was noted.  Mild L4-L5 spondylolisthesis was noted.  L4-L5 narrowing was noted.  Facet joint arthropathy was noted. Impression: These findings were suggestive of mild L4-L5 spondylolisthesis, spondylolisthesis and facet joint arthropathy.  XR Pelvis 1-2 Views Result  Date: 04/08/2023 Generalized osteopenia was noted.  No SI joint narrowing with sclerosis was noted.  No hip joint narrowing was noted. Impression: Unremarkable x-rays of the SI joints.  XR Hand 2 View Left Result Date: 04/08/2023 Generalized osteopenia was noted.  CMC, PIP and DIP narrowing was noted.  No MCP, intercarpal or radiocarpal joint space narrowing was noted.  No erosive changes were noted. Impression: These findings were suggestive of osteoarthritis.  Generalized osteopenia was noted.  XR Hand 2  View Right Result Date: 04/08/2023 Generalized osteopenia was noted.  CMC, PIP and DIP narrowing was noted.  No MCP, intercarpal or radiocarpal joint space narrowing was noted.  No erosive changes were noted. Impression: These findings were suggestive of osteoarthritis.  Generalized osteopenia was noted.   Recent Labs: Lab Results  Component Value Date   WBC 11.0 (H) 02/08/2023   HGB 13.4 02/08/2023   PLT 245.0 02/08/2023   NA 146 (H) 02/08/2023   K 4.5 02/08/2023   CL 107 02/08/2023   CO2 31 02/08/2023   GLUCOSE 90 02/08/2023   BUN 20 02/08/2023   CREATININE 0.52 02/08/2023   BILITOT 1.2 02/08/2023   ALKPHOS 94 02/08/2023   AST 17 02/08/2023   ALT 9 02/08/2023   PROT 6.8 02/08/2023   ALBUMIN 4.7 02/08/2023   CALCIUM 9.8 02/08/2023   QFTBGOLD Negative 06/04/2016    Speciality Comments: No specialty comments available.  Procedures:  No procedures performed Allergies: Patient has no known allergies.   Assessment / Plan:     Visit Diagnoses: Primary osteoarthritis of both hands - 04/08/23: No erosvie changes on XR.  RF-, Anti-CCP-, MCV-, ESR WNL, CRP WNL, uric acid WNL  Facet arthropathy, lumbar - lumbar XR 04/08/23: suggestive of mild L4-L5 spondylolisthesis, spondylolisthesis and facet joint arthropathy.  Chronic right SI joint pain - Pelvis XR unremarkable 04/08/23  History of bilateral carpal tunnel release  Greater trochanteric bursitis of right hip  S/P total knee arthroplasty, right  Multiple sclerosis (HCC)  Dysesthesia  Gait abnormality  Other insomnia  Orders: No orders of the defined types were placed in this encounter.  No orders of the defined types were placed in this encounter.   Face-to-face time spent with patient was *** minutes. Greater than 50% of time was spent in counseling and coordination of care.  Follow-Up Instructions: No follow-ups on file.   Romayne Clubs, PA-C  Note - This record has been created using Dragon software.  Chart  creation errors have been sought, but may not always  have been located. Such creation errors do not reflect on  the standard of medical care.

## 2023-05-04 ENCOUNTER — Ambulatory Visit: Payer: Self-pay

## 2023-05-04 NOTE — Telephone Encounter (Signed)
 Chief Complaint: Ankle swelling  Symptoms: Right foot and ankle swelling, burning feeling Frequency: Constant onset last night Pertinent Negatives: Patient denies fever, redness, chest pain, SOB Disposition: [] ED /[] Urgent Care (no appt availability in office) / [x] Appointment(In office/virtual)/ []  [facility]/ [] Home Care/ [] Refused Recommended Disposition /[] [facility]/ []  Follow-up with PCP Additional Notes: Patient states she noticed right ankle and foot swelling last night with some burning feeling. Patient states she has MS and that could be causing the burning feeling. Patient stated she has not had any known injury. Care advice given and patient has been scheduled for an appointment tomorrow afternoon with PCP.   Copied from CRM [id number]. Topic: Clinical - Red Word Triage >> May 04, 2023 11:46 AM Kita Perish H wrote: [facility] that prompted transfer to Nurse Triage: Right foot and ankle swollen can't put on a shoe, burning Reason for Disposition  SEVERE swelling (e.g., can't move swollen ankle at all)  Answer Assessment - Initial Assessment Questions 1. LOCATION: "Which ankle is swollen?" "Where is the swelling?"     Right ankle and foot  2. ONSET: "When did the swelling start?"     Last night  3. SWELLING: "How bad is the swelling?" Or, "How large is it?" (e.g., mild, moderate, severe; size of localized swelling)    - NONE: No joint swelling.   - LOCALIZED: Localized; small area of puffy or swollen skin (e.g., insect bite, skin irritation).   - MILD: Joint looks or feels mildly swollen or puffy.   - MODERATE: Swollen; interferes with normal activities (e.g., work or school); decreased range of movement; may be limping.   - SEVERE: Very swollen; can't move swollen joint at all; limping a lot or unable to walk.     Moderate  4. PAIN: "Is there any pain?" If Yes, ask: "How bad is it?" (Scale 1-10; or mild, moderate, severe)   - NONE (0): no pain.   - MILD (1-3):  doesn't interfere with normal activities.    - MODERATE (4-7): interferes with normal activities (e.g., work or school) or awakens from sleep, limping.    - SEVERE (8-10): excruciating pain, unable to do any normal activities, unable to walk.      Moderate  5. CAUSE: "What do you think caused the ankle swelling?"     Unsure I have MS 6. OTHER SYMPTOMS: "Do you have any other symptoms?" (e.g., fever, chest pain, difficulty breathing, calf pain)     Burning feeling  7. PREGNANCY: "Is there any chance you are pregnant?" "When was your last menstrual period?"     N/A  Protocols used: Ankle Swelling-A-AH

## 2023-05-05 ENCOUNTER — Encounter: Payer: Self-pay | Admitting: Family

## 2023-05-05 ENCOUNTER — Ambulatory Visit: Admitting: Family

## 2023-05-05 VITALS — BP 121/64 | HR 60 | Ht 64.0 in

## 2023-05-05 DIAGNOSIS — M79622 Pain in left upper arm: Secondary | ICD-10-CM

## 2023-05-05 DIAGNOSIS — R600 Localized edema: Secondary | ICD-10-CM | POA: Diagnosis not present

## 2023-05-05 NOTE — Progress Notes (Unsigned)
 Jessica Salazar is a 58 y.o. female with the following history as recorded in EpicCare:  Patient Active Problem List   Diagnosis Date Noted   Dysesthesia 06/04/2016   Urinary frequency 06/04/2016   Insomnia 06/04/2016   Greater trochanteric bursitis of right hip 06/10/2015   Gait abnormality 12/30/2014   Multiple sclerosis (HCC) 09/25/2010    Current Outpatient Medications  Medication Sig Dispense Refill   dalfampridine 10 MG TB12 Take 10 mg by mouth 2 (two) times daily.     diclofenac Sodium (VOLTAREN) 1 % GEL APPLY 2 G TOPICALLY 4 (FOUR) TIMES A DAY AS NEEDED FOR PAIN.     gabapentin (NEURONTIN) 100 MG capsule Take 200 mg by mouth at bedtime.     Ocrelizumab  (OCREVUS  IV) Inject into the vein. Infusion 2 times a year, last IV: 01/18/2023     Cholecalciferol (VITAMIN D ) 50 MCG (2000 UT) CAPS 3,000 Units. (Patient not taking: Reported on 05/05/2023)     meloxicam  (MOBIC ) 15 MG tablet Take 1 tablet (15 mg total) by mouth daily. (Patient not taking: Reported on 05/05/2023) 30 tablet 0   Multiple Vitamins-Minerals (MULTIVITAMIN GUMMIES ADULT PO) Take by mouth. Olly gummy multivitamin 2 po daily (Patient not taking: Reported on 05/05/2023)     nitrofurantoin , macrocrystal-monohydrate, (MACROBID ) 100 MG capsule Take 1 capsule (100 mg total) by mouth 2 (two) times daily. (Patient not taking: Reported on 05/05/2023) 14 capsule 1   promethazine  (PHENERGAN ) 25 MG tablet Take 1 tablet (25 mg total) by mouth every 6 (six) hours as needed for nausea or vomiting. (Patient not taking: Reported on 05/05/2023) 30 tablet 3   No current facility-administered medications for this visit.    Allergies: Patient has no known allergies.  Past Medical History:  Diagnosis Date   Arthritis    Constipation    due to MS   Heart murmur    MVP   History of recurrent UTIs    Hypotension    Mitral valve prolapse    Multiple sclerosis (HCC)    Neuromuscular disorder (HCC)    has /NMS stimulator    Vision abnormalities      Past Surgical History:  Procedure Laterality Date   CARPAL TUNNEL RELEASE Bilateral 2024   Dr. Delmar Ferrara,   COLONOSCOPY     last > 10 yrs ago in [place]  - done for constipation   MENISCUS REPAIR Right    right knee replacement Right 2016   right TOtal knee relacement    UPPER GASTROINTESTINAL ENDOSCOPY      Family History  Problem Relation Age of Onset   Heart attack Father    Down syndrome Sister    Healthy Son    Colon polyps Neg Hx    Colon cancer Neg Hx    Esophageal cancer Neg Hx    Rectal cancer Neg Hx    Stomach cancer Neg Hx     Social History   Tobacco Use   Smoking status: Former    Passive exposure: Never   Smokeless tobacco: Never  Substance Use Topics   Alcohol use: Yes    Comment: Rare    Subjective:  ***  @HCCSF @  Objective:  Vitals:   05/05/23 1449  BP: 121/64  Pulse: 60  SpO2: 99%  Height: 5\' 4"  (1.626 m)    General: Well developed, well nourished, in no acute distress  Skin : Warm and dry.  Head: Normocephalic and atraumatic  Eyes: Sclera and conjunctiva clear; pupils round and reactive to light; extraocular  movements intact  Ears: External normal; canals clear; tympanic membranes normal  Oropharynx: Pink, supple. No suspicious lesions  Neck: Supple without thyromegaly, adenopathy  Lungs: Respirations unlabored; clear to auscultation bilaterally without wheeze, rales, rhonchi  CVS exam: {heart exam:315510::"normal rate, regular rhythm, normal S1, S2, no murmurs, rubs, clicks or gallops"}.  Abdomen: Soft; nontender; nondistended; normoactive bowel sounds; no masses or hepatosplenomegaly  Musculoskeletal: No deformities; no active joint inflammation  Extremities: No edema, cyanosis, clubbing  Vessels: Symmetric bilaterally  Neurologic: Alert and oriented; speech intact; face symmetrical; moves all extremities well; CNII-XII intact without focal deficit  Assessment:  No diagnosis found.  Plan:  ***   No follow-ups on file.  No orders of  the defined types were placed in this encounter.   Requested Prescriptions    No prescriptions requested or ordered in this encounter

## 2023-05-06 ENCOUNTER — Encounter: Payer: Self-pay | Admitting: Family

## 2023-05-06 LAB — CBC WITH DIFFERENTIAL/PLATELET
Basophils Absolute: 0 10*3/uL (ref 0.0–0.1)
Basophils Relative: 0.7 % (ref 0.0–3.0)
Eosinophils Absolute: 0.1 10*3/uL (ref 0.0–0.7)
Eosinophils Relative: 2 % (ref 0.0–5.0)
HCT: 37.2 % (ref 36.0–46.0)
Hemoglobin: 12.5 g/dL (ref 12.0–15.0)
Lymphocytes Relative: 21.8 % (ref 12.0–46.0)
Lymphs Abs: 1.2 10*3/uL (ref 0.7–4.0)
MCHC: 33.7 g/dL (ref 30.0–36.0)
MCV: 90.3 fl (ref 78.0–100.0)
Monocytes Absolute: 0.5 10*3/uL (ref 0.1–1.0)
Monocytes Relative: 8.8 % (ref 3.0–12.0)
Neutro Abs: 3.7 10*3/uL (ref 1.4–7.7)
Neutrophils Relative %: 66.7 % (ref 43.0–77.0)
Platelets: 243 10*3/uL (ref 150.0–400.0)
RBC: 4.13 Mil/uL (ref 3.87–5.11)
RDW: 13.7 % (ref 11.5–15.5)
WBC: 5.6 10*3/uL (ref 4.0–10.5)

## 2023-05-06 LAB — COMPREHENSIVE METABOLIC PANEL WITH GFR
ALT: 8 U/L (ref 0–35)
AST: 14 U/L (ref 0–37)
Albumin: 4.4 g/dL (ref 3.5–5.2)
Alkaline Phosphatase: 66 U/L (ref 39–117)
BUN: 12 mg/dL (ref 6–23)
CO2: 30 meq/L (ref 19–32)
Calcium: 9.4 mg/dL (ref 8.4–10.5)
Chloride: 102 meq/L (ref 96–112)
Creatinine, Ser: 0.42 mg/dL (ref 0.40–1.20)
GFR: 106.68 mL/min (ref >60.00)
Glucose, Bld: 82 mg/dL (ref 70–99)
Potassium: 4 meq/L (ref 3.5–5.1)
Sodium: 141 meq/L (ref 135–145)
Total Bilirubin: 1.5 mg/dL — ABNORMAL HIGH (ref 0.2–1.2)
Total Protein: 6.4 g/dL (ref 6.0–8.3)

## 2023-05-06 LAB — BRAIN NATRIURETIC PEPTIDE: Pro B Natriuretic peptide (BNP): 51 pg/mL (ref 0.0–100.0)

## 2023-05-09 ENCOUNTER — Ambulatory Visit: Admitting: Physician Assistant

## 2023-05-09 DIAGNOSIS — G35 Multiple sclerosis: Secondary | ICD-10-CM

## 2023-05-09 DIAGNOSIS — R208 Other disturbances of skin sensation: Secondary | ICD-10-CM

## 2023-05-09 DIAGNOSIS — R269 Unspecified abnormalities of gait and mobility: Secondary | ICD-10-CM

## 2023-05-09 DIAGNOSIS — G8929 Other chronic pain: Secondary | ICD-10-CM

## 2023-05-09 DIAGNOSIS — Z9889 Other specified postprocedural states: Secondary | ICD-10-CM

## 2023-05-09 DIAGNOSIS — M19042 Primary osteoarthritis, left hand: Secondary | ICD-10-CM

## 2023-05-09 DIAGNOSIS — Z96651 Presence of right artificial knee joint: Secondary | ICD-10-CM

## 2023-05-09 DIAGNOSIS — G4709 Other insomnia: Secondary | ICD-10-CM

## 2023-05-09 DIAGNOSIS — M47816 Spondylosis without myelopathy or radiculopathy, lumbar region: Secondary | ICD-10-CM

## 2023-05-09 DIAGNOSIS — M7061 Trochanteric bursitis, right hip: Secondary | ICD-10-CM

## 2023-05-12 ENCOUNTER — Ambulatory Visit (HOSPITAL_COMMUNITY)
Admission: RE | Admit: 2023-05-12 | Discharge: 2023-05-12 | Disposition: A | Discharge status: Home / Self Care | Source: Ambulatory Visit | Attending: Family | Admitting: Family

## 2023-05-12 DIAGNOSIS — R600 Localized edema: Secondary | ICD-10-CM | POA: Diagnosis present

## 2023-05-13 ENCOUNTER — Ambulatory Visit: Admitting: Family

## 2023-05-13 ENCOUNTER — Encounter: Payer: Self-pay | Admitting: Family

## 2023-05-17 ENCOUNTER — Other Ambulatory Visit: Payer: Self-pay | Admitting: Family

## 2023-05-17 DIAGNOSIS — R600 Localized edema: Secondary | ICD-10-CM

## 2023-05-23 NOTE — Progress Notes (Unsigned)
 Office Visit Note  Patient: Jessica Salazar             Date of Birth: April 24, 1965           MRN: [id number]             PCP: Adra Alanis, FNP Referring: Adra Alanis,* Visit Date: 05/26/2023 Occupation: @GUAROCC @  Subjective:  Discuss results  History of Present Illness: Jessica Salazar is a 58 y.o. female with history of osteoarthritis.  Patient presents today to discuss x-ray and lab results.  Patient continues to experience stiffness involving both hands but denies any increased joint swelling or joint pain.  She has ongoing discomfort in the right side of her lower back.  She has tried use of Voltaren gel at night but often times has difficulty falling asleep due to severity of pain.  She has been taking gabapentin 200 mg at bedtime.      Component     Latest Ref Rng 04/08/2023  RA Latex Turbid.     <14 IU/mL <10   Cyclic Citrullin Peptide Ab     UNITS <16   MUTATED CITRULLINATED VIMENTIN (MCV) AB     <20 U/mL <20   Sed Rate     0 - 30 mm/h 6   CRP     <8.0 mg/L <3.0   Uric Acid, Serum     2.5 - 7.0 mg/dL 2.8     Activities of Daily Living:  Patient reports morning stiffness for less than 5 minutes.   Patient Reports nocturnal pain.  Difficulty dressing/grooming: Reports Difficulty climbing stairs: Reports Difficulty getting out of chair: Denies Difficulty using hands for taps, buttons, cutlery, and/or writing: Reports  Review of Systems  Constitutional:  Negative for fatigue.  HENT:  Negative for mouth sores and mouth dryness.   Eyes:  Negative for pain and dryness.  Respiratory:  Negative for cough and shortness of breath.   Cardiovascular:  Negative for chest pain and palpitations.  Gastrointestinal:  Positive for constipation. Negative for blood in stool and diarrhea.  Endocrine: Negative for increased urination.  Genitourinary:  Negative for involuntary urination.  Musculoskeletal:  Positive for joint pain, gait problem, joint pain, joint  swelling, myalgias, muscle weakness, morning stiffness and myalgias. Negative for muscle tenderness.  Skin:  Negative for color change, rash, hair loss and sensitivity to sunlight.  Allergic/Immunologic: Negative for susceptible to infections.  Neurological:  Negative for dizziness and headaches.  Hematological:  Negative for swollen glands.  Psychiatric/Behavioral:  Positive for sleep disturbance. Negative for depressed mood. The patient is not nervous/anxious.     PMFS History:  Patient Active Problem List   Diagnosis Date Noted   Dysesthesia 06/04/2016   Urinary frequency 06/04/2016   Insomnia 06/04/2016   Greater trochanteric bursitis of right hip 06/10/2015   Gait abnormality 12/30/2014   Multiple sclerosis (HCC) 09/25/2010    Past Medical History:  Diagnosis Date   Arthritis    Constipation    due to MS   Heart murmur    MVP   History of recurrent UTIs    Hypotension    Mitral valve prolapse    Multiple sclerosis (HCC)    Neuromuscular disorder (HCC)    has /NMS stimulator    Vision abnormalities     Family History  Problem Relation Age of Onset   Heart attack Father    Down syndrome Sister    Healthy Son    Colon polyps Neg Hx  Colon cancer Neg Hx    Esophageal cancer Neg Hx    Rectal cancer Neg Hx    Stomach cancer Neg Hx    Past Surgical History:  Procedure Laterality Date   CARPAL TUNNEL RELEASE Bilateral 2024   Dr. Delmar Ferrara,   COLONOSCOPY     last > 10 yrs ago in [place]  - done for constipation   MENISCUS REPAIR Right    right knee replacement Right 2016   right TOtal knee relacement    UPPER GASTROINTESTINAL ENDOSCOPY     Social History   Social History Narrative   Pt is single with boyfriends she has 1 child right handed   Lives in 1 story home   Drinks coffee daily, no tea rarely drinks soda   Immunization History  Administered Date(s) Administered   Fluzone Influenza virus vaccine,trivalent (IIV3), split virus 10/22/2014   Influenza Inj  Mdck Quad Pf 10/20/2018   Influenza Inj Mdck Quad With Preservative 10/11/2021   Influenza-Unspecified 10/22/2014   PFIZER(Purple Top)SARS-COV-2 Vaccination 03/24/2019, 04/14/2019, 09/02/2019   Tdap 09/08/2015     Objective: Vital Signs: BP (!) 90/59 (BP Location: Left Arm, Patient Position: Sitting, Cuff Size: Normal)   Pulse (!) 56   Resp 14   Ht 5' 4.5" (1.638 m)   Wt 101 lb (45.8 kg)   BMI 17.07 kg/m    Physical Exam Vitals and nursing note reviewed.  Constitutional:      Appearance: She is well-developed.  HENT:     Head: Normocephalic and atraumatic.  Eyes:     Conjunctiva/sclera: Conjunctivae normal.  Cardiovascular:     Rate and Rhythm: Normal rate and regular rhythm.     Heart sounds: Normal heart sounds.  Pulmonary:     Effort: Pulmonary effort is normal.     Breath sounds: Normal breath sounds.  Abdominal:     General: Bowel sounds are normal.     Palpations: Abdomen is soft.  Musculoskeletal:     Cervical back: Normal range of motion.  Lymphadenopathy:     Cervical: No cervical adenopathy.  Skin:    General: Skin is warm and dry.     Capillary Refill: Capillary refill takes less than 2 seconds.  Neurological:     Mental Status: She is alert and oriented to person, place, and time.  Psychiatric:        Behavior: Behavior normal.      Musculoskeletal Exam: Patient remained in the wheelchair during the exam.  No tenderness of shoulder joints noted.  No tenderness or inflammation along the elbow joint line.  No tenderness or synovitis of wrist joints or MCP joints.  Tenderness of the right 3rd PIP joint. Right knee replacement has discomfort with ROM.  Left knee has good ROM with crepitus but no warmth or effusion noted.  No tenderness or swelling of ankle joints.  Edema noted in right lower leg.   CDAI Exam: CDAI Score: -- Patient Global: --; Provider Global: -- Swollen: --; Tender: -- Joint Exam 05/26/2023   No joint exam has been documented for this  visit   There is currently no information documented on the homunculus. Go to the Rheumatology activity and complete the homunculus joint exam.  Investigation: No additional findings.  Imaging: VAS US  LOWER EXTREMITY VENOUS (DVT) Result Date: 05/12/2023  Lower Venous DVT Study Patient Name:  Jessica Salazar  Date of Exam:   05/12/2023 Medical Rec #: [id number]       Accession #:    [id number] Date  of Birth: 06-15-1965       Patient Gender: F Patient Age:   15 years Exam Location:  [address] Procedure:      VAS US  LOWER EXTREMITY VENOUS (DVT) Referring Phys: Glade Lambert --------------------------------------------------------------------------------  Indications: Swelling, and R>L. Other Indications: Leg/Foot swelling R>L per Note: 05/05/23 Glade Lambert NP. Risk Factors: Trauma Right Knee.  Performing Technologist: Jenifer Miu RVT  Examination Guidelines: A complete evaluation includes B-mode imaging, spectral Doppler, color Doppler, and power Doppler as needed of all accessible portions of each vessel. Bilateral testing is considered an integral part of a complete examination. Limited examinations for reoccurring indications may be performed as noted. The reflux portion of the exam is performed with the patient in reverse Trendelenburg.  +---------+---------------+---------+-----------+----------+--------------+ RIGHT    CompressibilityPhasicitySpontaneityPropertiesThrombus Aging +---------+---------------+---------+-----------+----------+--------------+ CFV      Full           Yes      Yes        patent                   +---------+---------------+---------+-----------+----------+--------------+ SFJ      Full           Yes      Yes        patent                   +---------+---------------+---------+-----------+----------+--------------+ FV Prox  Full           Yes      Yes        patent                    +---------+---------------+---------+-----------+----------+--------------+ FV Mid   Full           Yes      Yes        patent                   +---------+---------------+---------+-----------+----------+--------------+ FV DistalFull           Yes      Yes        patent                   +---------+---------------+---------+-----------+----------+--------------+ PFV      Full           Yes      Yes        patent                   +---------+---------------+---------+-----------+----------+--------------+ POP      Full           Yes      Yes        patent                   +---------+---------------+---------+-----------+----------+--------------+ PTV      Full                               patent                   +---------+---------------+---------+-----------+----------+--------------+ PERO     Full                               patent                   +---------+---------------+---------+-----------+----------+--------------+ Gastroc  Full                                                        +---------+---------------+---------+-----------+----------+--------------+  GSV      Full                               patent                   +---------+---------------+---------+-----------+----------+--------------+ SSV      Full                               patent                   +---------+---------------+---------+-----------+----------+--------------+  +----+---------------+---------+-----------+----------+--------------+ LEFTCompressibilityPhasicitySpontaneityPropertiesThrombus Aging +----+---------------+---------+-----------+----------+--------------+ CFV Full           Yes      Yes        patent                   +----+---------------+---------+-----------+----------+--------------+  Summary: RIGHT: - There is no evidence of deep vein thrombosis in the lower extremity. - There is no evidence of superficial venous thrombosis.   LEFT: - No evidence of common femoral vein obstruction.   *See table(s) above for measurements and observations. Electronically signed by Irvin Mantel on 05/12/2023 at 5:10:45 PM.    Final     Recent Labs: Lab Results  Component Value Date   WBC 5.6 05/05/2023   HGB 12.5 05/05/2023   PLT 243.0 05/05/2023   NA 141 05/05/2023   K 4.0 05/05/2023   CL 102 05/05/2023   CO2 30 05/05/2023   GLUCOSE 82 05/05/2023   BUN 12 05/05/2023   CREATININE 0.42 05/05/2023   BILITOT 1.5 (H) 05/05/2023   ALKPHOS 66 05/05/2023   AST 14 05/05/2023   ALT 8 05/05/2023   PROT 6.4 05/05/2023   ALBUMIN 4.4 05/05/2023   CALCIUM 9.4 05/05/2023   QFTBGOLD Negative 06/04/2016    Speciality Comments: No specialty comments available.  Procedures:  No procedures performed Allergies: Patient has no known allergies.   Assessment / Plan:     Visit Diagnoses: Primary osteoarthritis of both hands: X-rays of both hands were obtained on 04/08/2023 which were consistent with osteoarthritis and generalized osteopenia.  No erosive changes were noted.  X-ray images were reviewed with the patient today in the office.  She had no synovitis on exam.  Patient has been experiencing stiffness and tightness in both hands when making a complete fist but has not having any joint pain at this time. Lab work from the initial office visit on 04/08/2023 was reviewed today in the office: MCV negative, anti-CCP negative, RF negative, ESR within normal limits, CRP within normal limits, and uric acid within normal limits. Offered to schedule ultrasound to assess for synovitis but she would like to hold off at this time.  Patient plans on trying arthritis compression gloves and using Voltaren gel topically for pain relief.  Discussed the option of hand therapy in the future.  She will notify us  if she develops any increased joint pain or joint swelling.  She will follow-up in the office in 6 months or sooner if needed.  History of bilateral  carpal tunnel release:2024-Performed by Dr. Delmar Ferrara. No complications.   Greater trochanteric bursitis of right hip: Discomfort at night.   S/P total knee arthroplasty, right: - Dr. [facility] orth[facility]-9 yr ago. No effusion noted.   Chronic right SI joint pain - XR of pelvis unremarkable 04/08/23-reviewed x-ray with the patient today.   Arthropathy  of lumbar facet joint - XR 04/08/23:Mild levoscoliosis was noted.  Mild L4-L5 spondylolisthesis was noted.  L4-L5 narrowing was noted.  Facet joint arthropathy.  Patient's been experiencing increased discomfort in the right side of her lower back especially at night.  She has been taking gabapentin 200 mg at bedtime and using Voltaren gel topically.  She has not found Voltaren gel to be effective at managing her discomfort. A prescription for Lidoderm patches will be sent to the pharmacy today.  A referral to a spine specialist will also be placed today.  Other medical conditions are listed as follows:   Multiple sclerosis (HCC): Under care of [facility]-treated with ocrelizumab  since 2020.Prior therapies include: Avonex, Rebif, Aubagio.  Currently on Ocrelizumab  infusions.    Dysesthesia  Gait abnormality: Patient remained in wheelchair during the exam.   Other insomnia: She has been taking gabapentin 200 mg at bedtime.    Orders: No orders of the defined types were placed in this encounter.  Meds ordered this encounter  Medications   lidocaine (LIDODERM) 5 %    Sig: Place 1 patch onto the skin daily. Remove & Discard patch within 12 hours or as directed by MD    Dispense:  30 patch    Refill:  0     Follow-Up Instructions: Return in about 6 months (around 11/26/2023) for Osteoarthritis.   Romayne Clubs, PA-C  Note - This record has been created using Dragon software.  Chart creation errors have been sought, but may not always  have been located. Such creation errors do not reflect on  the standard of medical care.

## 2023-05-26 ENCOUNTER — Encounter: Payer: Self-pay | Admitting: Physician Assistant

## 2023-05-26 ENCOUNTER — Ambulatory Visit: Attending: Physician Assistant | Admitting: Physician Assistant

## 2023-05-26 VITALS — BP 90/59 | HR 56 | Resp 14 | Ht 64.5 in | Wt 101.0 lb

## 2023-05-26 DIAGNOSIS — Z96651 Presence of right artificial knee joint: Secondary | ICD-10-CM | POA: Diagnosis not present

## 2023-05-26 DIAGNOSIS — Z9889 Other specified postprocedural states: Secondary | ICD-10-CM | POA: Diagnosis not present

## 2023-05-26 DIAGNOSIS — R208 Other disturbances of skin sensation: Secondary | ICD-10-CM

## 2023-05-26 DIAGNOSIS — G35 Multiple sclerosis: Secondary | ICD-10-CM

## 2023-05-26 DIAGNOSIS — M19041 Primary osteoarthritis, right hand: Secondary | ICD-10-CM

## 2023-05-26 DIAGNOSIS — M533 Sacrococcygeal disorders, not elsewhere classified: Secondary | ICD-10-CM

## 2023-05-26 DIAGNOSIS — M7061 Trochanteric bursitis, right hip: Secondary | ICD-10-CM

## 2023-05-26 DIAGNOSIS — R269 Unspecified abnormalities of gait and mobility: Secondary | ICD-10-CM

## 2023-05-26 DIAGNOSIS — M47816 Spondylosis without myelopathy or radiculopathy, lumbar region: Secondary | ICD-10-CM

## 2023-05-26 DIAGNOSIS — G8929 Other chronic pain: Secondary | ICD-10-CM

## 2023-05-26 DIAGNOSIS — G4709 Other insomnia: Secondary | ICD-10-CM

## 2023-05-26 DIAGNOSIS — M19042 Primary osteoarthritis, left hand: Secondary | ICD-10-CM

## 2023-05-26 MED ORDER — LIDOCAINE 5 % EX PTCH: 1.0000 | MEDICATED_PATCH | CUTANEOUS | 0 refills | Status: DC

## 2023-05-26 NOTE — Addendum Note (Signed)
 Addended by: Glori Lard C on: 05/26/2023 11:01 AM   Modules accepted: Orders

## 2023-05-27 ENCOUNTER — Ambulatory Visit
Admission: RE | Admit: 2023-05-27 | Discharge: 2023-05-27 | Disposition: A | Discharge status: Home / Self Care | Source: Ambulatory Visit | Attending: Family | Admitting: Family

## 2023-05-27 ENCOUNTER — Other Ambulatory Visit: Payer: Self-pay | Admitting: Family

## 2023-05-27 DIAGNOSIS — M79622 Pain in left upper arm: Secondary | ICD-10-CM

## 2023-05-31 ENCOUNTER — Telehealth: Payer: Self-pay | Admitting: *Deleted

## 2023-05-31 NOTE — Telephone Encounter (Signed)
Submitted a Prior Authorization request to [employer] for Lidoderm Patches via CoverMyMeds. Will update once we receive a response.

## 2023-06-01 ENCOUNTER — Encounter: Payer: Self-pay | Admitting: Family

## 2023-06-01 NOTE — Telephone Encounter (Signed)
 Received a fax regarding Prior Authorization from [facility] [employer] for Lidocaine  patch 5%. Authorization has been DENIED because the plan only covers the drug when it is used for certain health conditions.  Phone# [phone]   Patient advised that she could use [facility]. Patient asked about a online pharmacy called [facility], advised patient that I had not heard of that pharmacy. Patient stated she would send it to her primary and have them fill the paper out and send it in.

## 2023-06-03 NOTE — Telephone Encounter (Signed)
 Form has been printed and placed in PCP folder for review.

## 2023-06-07 ENCOUNTER — Encounter: Payer: Self-pay | Admitting: Family

## 2023-06-07 ENCOUNTER — Other Ambulatory Visit: Payer: Self-pay | Admitting: Family

## 2023-06-07 MED ORDER — LIDOCAINE 5 % EX PTCH: 1.0000 | MEDICATED_PATCH | CUTANEOUS | 0 refills | Status: DC

## 2023-06-27 ENCOUNTER — Other Ambulatory Visit: Payer: Self-pay | Admitting: Family

## 2023-06-27 ENCOUNTER — Encounter: Payer: Self-pay | Admitting: Family

## 2023-06-27 MED ORDER — NITROFURANTOIN MONOHYD MACRO 100 MG PO CAPS: 100.0000 mg | ORAL_CAPSULE | Freq: Two times a day (BID) | ORAL | 1 refills | Status: DC

## 2023-06-27 NOTE — Telephone Encounter (Signed)
 Copied from CRM [id number]. Topic: Clinical - Medical Advice >> Jun 27, 2023  8:51 AM [name] wrote: Reason for CRM: Patient called in stated she she has message Rice Chamorro through [facility] requesting antibiotic for her UTI, stated she gotten a respond to schedule an appointment, patient stated she can do a virtual but can not come in today so she would like NP Honora Lutes to send in her antibiotics as she normally does

## 2023-07-08 ENCOUNTER — Ambulatory Visit (INDEPENDENT_AMBULATORY_CARE_PROVIDER_SITE_OTHER): Admitting: Family

## 2023-07-08 ENCOUNTER — Ambulatory Visit: Payer: Self-pay

## 2023-07-08 ENCOUNTER — Other Ambulatory Visit (HOSPITAL_BASED_OUTPATIENT_CLINIC_OR_DEPARTMENT_OTHER): Payer: Self-pay

## 2023-07-08 VITALS — BP 108/62 | HR 73 | Ht 64.5 in | Wt 98.8 lb

## 2023-07-08 DIAGNOSIS — R300 Dysuria: Secondary | ICD-10-CM

## 2023-07-08 LAB — POCT URINALYSIS DIPSTICK
Bilirubin, UA: NEGATIVE
Glucose, UA: NEGATIVE
Ketones, UA: NEGATIVE
Nitrite, UA: POSITIVE
Protein, UA: POSITIVE — AB
Spec Grav, UA: <=1.005 — AB (ref 1.010–1.025)
Urobilinogen, UA: 0.2 U/dL
pH, UA: 7.5 (ref 5.0–8.0)

## 2023-07-08 MED ORDER — CIPROFLOXACIN HCL 500 MG PO TABS
500.0000 mg | ORAL_TABLET | Freq: Two times a day (BID) | ORAL | 0 refills | Status: AC
  Filled 2023-07-08: qty 10, 5d supply, fill #0

## 2023-07-08 MED ORDER — CIPROFLOXACIN HCL 500 MG PO TABS: 500.0000 mg | ORAL_TABLET | Freq: Two times a day (BID) | ORAL | 0 refills | Status: DC

## 2023-07-08 NOTE — Progress Notes (Signed)
 Jessica Salazar is a 58 y.o. female with the following history as recorded in [employer]:  Patient Active Problem List   Diagnosis Date Noted   Dysesthesia 06/04/2016   Urinary frequency 06/04/2016   Insomnia 06/04/2016   Greater trochanteric bursitis of right hip 06/10/2015   Gait abnormality 12/30/2014   Multiple sclerosis (HCC) 09/25/2010    Current Outpatient Medications  Medication Sig Dispense Refill   Cholecalciferol (VITAMIN D ) 50 MCG (2000 UT) CAPS 3,000 Units.     dalfampridine 10 MG TB12 Take 10 mg by mouth 2 (two) times daily.     diclofenac Sodium (VOLTAREN) 1 % GEL APPLY 2 G TOPICALLY 4 (FOUR) TIMES A DAY AS NEEDED FOR PAIN.     gabapentin (NEURONTIN) 100 MG capsule Take 200 mg by mouth at bedtime.     lidocaine  (LIDODERM ) 5 % Place 1 patch onto the skin daily. Remove & Discard patch within 12 hours or as directed by MD 30 patch 0   Multiple Vitamins-Minerals (MULTIVITAMIN GUMMIES ADULT PO) Take by mouth. Olly gummy multivitamin 2 po daily     nitrofurantoin , macrocrystal-monohydrate, (MACROBID ) 100 MG capsule Take 1 capsule (100 mg total) by mouth 2 (two) times daily. 14 capsule 1   Ocrelizumab  (OCREVUS  IV) Inject into the vein. Infusion 2 times a year, last IV: 01/18/2023     promethazine  (PHENERGAN ) 25 MG tablet Take 1 tablet (25 mg total) by mouth every 6 (six) hours as needed for nausea or vomiting. 30 tablet 3   ciprofloxacin  (CIPRO ) 500 MG tablet Take 1 tablet (500 mg total) by mouth 2 (two) times daily for 5 days. 10 tablet 0   No current facility-administered medications for this visit.    Allergies: Patient has no known allergies.  Past Medical History:  Diagnosis Date   Arthritis    Constipation    due to MS   Heart murmur    MVP   History of recurrent UTIs    Hypotension    Mitral valve prolapse    Multiple sclerosis (HCC)    Neuromuscular disorder (HCC)    has /NMS stimulator    Vision abnormalities     Past Surgical History:  Procedure Laterality  Date   CARPAL TUNNEL RELEASE Bilateral 2024   Dr. Alyse,   COLONOSCOPY     last > 10 yrs ago in [place]  - done for constipation   MENISCUS REPAIR Right    right knee replacement Right 2016   right TOtal knee relacement    UPPER GASTROINTESTINAL ENDOSCOPY      Family History  Problem Relation Age of Onset   Heart attack Father    Down syndrome Sister    Healthy Son    Colon polyps Neg Hx    Colon cancer Neg Hx    Esophageal cancer Neg Hx    Rectal cancer Neg Hx    Stomach cancer Neg Hx     Social History   Tobacco Use   Smoking status: Former    Passive exposure: Never   Smokeless tobacco: Never  Substance Use Topics   Alcohol use: Yes    Comment: Rare    Subjective:   Patient had called on 6/16 with concerns for UTI; is prone to recurrent UTIs and typically responds well to Nitrofurantoin ; initially responded well- did not complete all 7 days of treatment; symptoms re-started this morning with urinary urgency;   Objective:  Vitals:   07/08/23 1315  BP: 108/62  Pulse: 73  SpO2: 99%  Weight: 98 lb 12.8 oz (44.8 kg)  Height: 5' 4.5 (1.638 m)    General: Well developed, well nourished, in no acute distress  Skin : Warm and dry.  Head: Normocephalic and atraumatic  Lungs: Respirations unlabored; clear to auscultation bilaterally without wheeze, rales, rhonchi  CVS exam: normal rate and regular rhythm.  Neurologic: Alert and oriented; speech intact; face symmetrical; moves all extremities well; CNII-XII intact without focal deficit   Assessment:  1. Dysuria     Plan:  Check U/A and urine culture; suspect infection from earlier this month did not clear completely; will change to Cipro  which she has tolerated well in the past; follow up to be determined once culture results obtained next week.   No follow-ups on file.  Orders Placed This Encounter  Procedures   Urine Culture   POCT Urinalysis Dipstick    Requested Prescriptions   Signed Prescriptions Disp  Refills   ciprofloxacin  (CIPRO ) 500 MG tablet 10 tablet 0    Sig: Take 1 tablet (500 mg total) by mouth 2 (two) times daily for 5 days.

## 2023-07-08 NOTE — Patient Instructions (Signed)
 It is very important that you reach out to your neurologist with concerns about the tingling in your legs. I am concerned about your MS and they need to know about these symptoms.

## 2023-07-08 NOTE — Telephone Encounter (Signed)
 FYI Only or Action Required?: FYI only for provider.  Patient was last seen in primary care on 05/05/2023 by Jason Leita Repine, FNP. Called Nurse Triage reporting Leg concerns. Symptoms began several days ago. Interventions attempted: Nothing. Symptoms are: gradually worsening.  Triage Disposition: See PCP When Office is Open (Within 3 Days) Scheduled.  Patient/caregiver understands and will follow disposition?: Yes           Copied from CRM [id number]. Topic: Clinical - Red Word Triage >> Jul 08, 2023 10:19 AM Jessica Salazar wrote: Red Word that prompted transfer to Nurse Triage: Odd tingling lin legs- really uncomfortable difficulty with walking. Been fighting a uti, dealing with frequent urination still Reason for Disposition  [1] Numbness or tingling in one or both feet AND [2] is a chronic symptom (recurrent or ongoing AND present > 4 weeks)    Patient has MS  Answer Assessment - Initial Assessment Questions Additional Information:   Patient reports she has a UTI ( onset: 1 week ago. Urgency and frequency) Patient also has a HX of MS.  Patient reports that  my UTI symptoms and leg tingling are impacting each other.   Denies worsening in leg tingling, confirms that it feels like her baseline.   Patient adamant about getting PCP appointment.  Appt scheduled appropriately.   --------------------------------------------------------------------------------------  1. SYMPTOM: What is the main symptom you are concerned about? (e.g., weakness, numbness)     ----------- Bilateral Leg tingling    2. ONSET: When did this start? (minutes, hours, days; while sleeping)     ----- 32 yrs ago, Pt has history of MS   3. LAST NORMAL: When was the last time you (the patient) were normal (no symptoms)?      ----------------- History of MS   4. PATTERN Does this come and go, or has it been constant since it started?  Is it present now?     ------------ Constant: has a history      6. NEUROLOGIC SYMPTOMS: Have you had any of the following symptoms: headache, dizziness, vision loss, double vision, changes in speech, unsteady on your feet?     ---Denies    7. OTHER SYMPTOMS: Do you have any other symptoms?     --- UTI-- freq and urgency ( onset: last week )--  Answer Assessment - Initial Assessment Questions 1. SYMPTOM: What's the main symptom you're concerned about? (e.g., frequency, incontinence)     *No Answer* 2. ONSET: When did the  *No Answer*  start?     *No Answer* 3. PAIN: Is there any pain? If Yes, ask: How bad is it? (Scale: 1-10; mild, moderate, severe)     *No Answer* 4. CAUSE: What do you think is causing the symptoms?     *No Answer* 5. OTHER SYMPTOMS: Do you have any other symptoms? (e.g., blood in urine, fever, flank pain, pain with urination)     *No Answer* 6. PREGNANCY: Is there any chance you are pregnant? When was your last menstrual period?     *No Answer*  Protocols used: Neurologic Deficit-A-AH, Urinary Symptoms-A-AH

## 2023-07-09 LAB — URINE CULTURE
MICRO NUMBER:: 16634355
SPECIMEN QUALITY:: ADEQUATE

## 2023-07-11 ENCOUNTER — Ambulatory Visit: Payer: Self-pay | Admitting: Family

## 2023-09-02 ENCOUNTER — Ambulatory Visit: Admitting: Cardiology

## 2023-09-09 ENCOUNTER — Encounter: Payer: Self-pay | Admitting: Family

## 2023-09-09 ENCOUNTER — Ambulatory Visit (INDEPENDENT_AMBULATORY_CARE_PROVIDER_SITE_OTHER): Admitting: Family

## 2023-09-09 ENCOUNTER — Other Ambulatory Visit (HOSPITAL_BASED_OUTPATIENT_CLINIC_OR_DEPARTMENT_OTHER): Payer: Self-pay

## 2023-09-09 VITALS — BP 108/62 | HR 61 | Ht 64.5 in | Wt 99.0 lb

## 2023-09-09 DIAGNOSIS — Z1211 Encounter for screening for malignant neoplasm of colon: Secondary | ICD-10-CM

## 2023-09-09 DIAGNOSIS — J019 Acute sinusitis, unspecified: Secondary | ICD-10-CM

## 2023-09-09 DIAGNOSIS — Z860100 Personal history of colon polyps, unspecified: Secondary | ICD-10-CM

## 2023-09-09 MED ORDER — AMOXICILLIN-POT CLAVULANATE 875-125 MG PO TABS
1.0000 | ORAL_TABLET | Freq: Two times a day (BID) | ORAL | 0 refills | Status: AC
  Filled 2023-09-09: qty 14, 7d supply, fill #0

## 2023-09-09 NOTE — Progress Notes (Signed)
 LAIYLA SLAGEL is a 58 y.o. female with the following history as recorded in [employer]:  Patient Active Problem List   Diagnosis Date Noted   Dysesthesia 06/04/2016   Urinary frequency 06/04/2016   Insomnia 06/04/2016   Greater trochanteric bursitis of right hip 06/10/2015   Gait abnormality 12/30/2014   Multiple sclerosis (HCC) 09/25/2010    Current Outpatient Medications  Medication Sig Dispense Refill   amoxicillin -clavulanate (AUGMENTIN ) 875-125 MG tablet Take 1 tablet by mouth 2 (two) times daily for 7 days. 14 tablet 0   Cholecalciferol (VITAMIN D ) 50 MCG (2000 UT) CAPS 3,000 Units.     dalfampridine 10 MG TB12 Take 10 mg by mouth 2 (two) times daily.     lidocaine  (LIDODERM ) 5 % Place 1 patch onto the skin daily. Remove & Discard patch within 12 hours or as directed by MD 30 patch 0   nitrofurantoin , macrocrystal-monohydrate, (MACROBID ) 100 MG capsule Take 1 capsule (100 mg total) by mouth 2 (two) times daily. 14 capsule 1   Ocrelizumab  (OCREVUS  IV) Inject into the vein. Infusion 2 times a year, last IV: 01/18/2023     promethazine  (PHENERGAN ) 25 MG tablet Take 1 tablet (25 mg total) by mouth every 6 (six) hours as needed for nausea or vomiting. 30 tablet 3   No current facility-administered medications for this visit.    Allergies: Patient has no known allergies.  Past Medical History:  Diagnosis Date   Arthritis    Constipation    due to MS   Heart murmur    MVP   History of recurrent UTIs    Hypotension    Mitral valve prolapse    Multiple sclerosis (HCC)    Neuromuscular disorder (HCC)    has /NMS stimulator    Vision abnormalities     Past Surgical History:  Procedure Laterality Date   CARPAL TUNNEL RELEASE Bilateral 2024   Dr. Alyse,   COLONOSCOPY     last > 10 yrs ago in [place]  - done for constipation   MENISCUS REPAIR Right    right knee replacement Right 2016   right TOtal knee relacement    UPPER GASTROINTESTINAL ENDOSCOPY      Family History   Problem Relation Age of Onset   Heart attack Father    Down syndrome Sister    Healthy Son    Colon polyps Neg Hx    Colon cancer Neg Hx    Esophageal cancer Neg Hx    Rectal cancer Neg Hx    Stomach cancer Neg Hx     Social History   Tobacco Use   Smoking status: Former    Passive exposure: Never   Smokeless tobacco: Never  Substance Use Topics   Alcohol use: Yes    Comment: Rare    Subjective:   2 week history of lingering cough/ congestion; just can't shake it; no fever or chest pain or shortness of breath;   Objective:  Vitals:   09/09/23 1538  BP: 108/62  Pulse: 61  SpO2: 94%  Weight: 99 lb (44.9 kg)  Height: 5' 4.5 (1.638 m)    General: Well developed, well nourished, in no acute distress  Skin : Warm and dry.  Head: Normocephalic and atraumatic  Eyes: Sclera and conjunctiva clear; pupils round and reactive to light; extraocular movements intact  Ears: External normal; canals clear; tympanic membranes normal  Oropharynx: Pink, supple. No suspicious lesions  Neck: Supple without thyromegaly, adenopathy  Lungs: Respirations unlabored; clear to auscultation bilaterally  without wheeze, rales, rhonchi  CVS exam: normal rate and regular rhythm.  Neurologic: Alert and oriented; speech intact; face symmetrical; moves all extremities well; CNII-XII intact without focal deficit   Assessment:  1. Acute sinusitis, recurrence not specified, unspecified location   2. Encounter for colonoscopy due to history of colonic polyp     Plan:  Rx for Augmentin  875 mg bid x 7 days; increase fluids, rest and follow up worse, no better; Referral updated for colonoscopy- was due in October 2024;   No follow-ups on file.  Orders Placed This Encounter  Procedures   Ambulatory referral to Gastroenterology    Referral Priority:   Routine    Referral Type:   Consultation    Referral Reason:   Specialty Services Required    Referred to Provider:   Mansouraty, Aloha Raddle., MD     Number of Visits Requested:   1    Requested Prescriptions   Signed Prescriptions Disp Refills   amoxicillin -clavulanate (AUGMENTIN ) 875-125 MG tablet 14 tablet 0    Sig: Take 1 tablet by mouth 2 (two) times daily for 7 days.

## 2023-09-14 ENCOUNTER — Other Ambulatory Visit: Payer: Self-pay | Admitting: Family

## 2023-09-14 ENCOUNTER — Encounter: Payer: Self-pay | Admitting: Family

## 2023-09-14 DIAGNOSIS — R059 Cough, unspecified: Secondary | ICD-10-CM

## 2023-09-14 DIAGNOSIS — N390 Urinary tract infection, site not specified: Secondary | ICD-10-CM

## 2023-09-14 DIAGNOSIS — R5383 Other fatigue: Secondary | ICD-10-CM

## 2023-09-16 ENCOUNTER — Ambulatory Visit: Payer: Self-pay | Admitting: Family

## 2023-09-16 ENCOUNTER — Ambulatory Visit (INDEPENDENT_AMBULATORY_CARE_PROVIDER_SITE_OTHER)
Admission: RE | Admit: 2023-09-16 | Discharge: 2023-09-16 | Disposition: A | Discharge status: Home / Self Care | Source: Ambulatory Visit | Attending: Family | Admitting: Family

## 2023-09-16 ENCOUNTER — Other Ambulatory Visit (INDEPENDENT_AMBULATORY_CARE_PROVIDER_SITE_OTHER)

## 2023-09-16 DIAGNOSIS — R059 Cough, unspecified: Secondary | ICD-10-CM

## 2023-09-16 DIAGNOSIS — N390 Urinary tract infection, site not specified: Secondary | ICD-10-CM

## 2023-09-16 DIAGNOSIS — R5383 Other fatigue: Secondary | ICD-10-CM

## 2023-09-16 LAB — COMPREHENSIVE METABOLIC PANEL WITH GFR
ALT: 8 U/L (ref 0–35)
AST: 15 U/L (ref 0–37)
Albumin: 4 g/dL (ref 3.5–5.2)
Alkaline Phosphatase: 67 U/L (ref 39–117)
BUN: 18 mg/dL (ref 6–23)
CO2: 30 meq/L (ref 19–32)
Calcium: 8.7 mg/dL (ref 8.4–10.5)
Chloride: 104 meq/L (ref 96–112)
Creatinine, Ser: 0.42 mg/dL (ref 0.40–1.20)
GFR: 106.41 mL/min (ref >60.00)
Glucose, Bld: 68 mg/dL — ABNORMAL LOW (ref 70–99)
Potassium: 4 meq/L (ref 3.5–5.1)
Sodium: 143 meq/L (ref 135–145)
Total Bilirubin: 0.6 mg/dL (ref 0.2–1.2)
Total Protein: 6.2 g/dL (ref 6.0–8.3)

## 2023-09-16 LAB — CBC WITH DIFFERENTIAL/PLATELET
Basophils Absolute: 0 K/uL (ref 0.0–0.1)
Basophils Relative: 0.5 % (ref 0.0–3.0)
Eosinophils Absolute: 0.1 K/uL (ref 0.0–0.7)
Eosinophils Relative: 2.9 % (ref 0.0–5.0)
HCT: 35.6 % — ABNORMAL LOW (ref 36.0–46.0)
Hemoglobin: 12 g/dL (ref 12.0–15.0)
Lymphocytes Relative: 29.8 % (ref 12.0–46.0)
Lymphs Abs: 1 K/uL (ref 0.7–4.0)
MCHC: 33.7 g/dL (ref 30.0–36.0)
MCV: 88.1 fl (ref 78.0–100.0)
Monocytes Absolute: 0.5 K/uL (ref 0.1–1.0)
Monocytes Relative: 14.9 % — ABNORMAL HIGH (ref 3.0–12.0)
Neutro Abs: 1.7 K/uL (ref 1.4–7.7)
Neutrophils Relative %: 51.9 % (ref 43.0–77.0)
Platelets: 238 K/uL (ref 150.0–400.0)
RBC: 4.04 Mil/uL (ref 3.87–5.11)
RDW: 13.5 % (ref 11.5–15.5)
WBC: 3.3 K/uL — ABNORMAL LOW (ref 4.0–10.5)

## 2023-09-17 LAB — URINE CULTURE
MICRO NUMBER:: 16928819
Result:: NO GROWTH
SPECIMEN QUALITY:: ADEQUATE

## 2023-09-26 ENCOUNTER — Ambulatory Visit: Payer: Self-pay | Admitting: Family

## 2023-09-27 ENCOUNTER — Encounter: Admitting: Family

## 2023-11-18 NOTE — Progress Notes (Deleted)
 Office Visit Note  Patient: Jessica Salazar             Date of Birth: 28-Aug-1965           MRN: [id number]             PCP: Jason Leita Repine, FNP (Inactive) Referring: Jason Leita Repine,* Visit Date: 12/02/2023 Occupation: Data Unavailable  Subjective:  No chief complaint on file.   History of Present Illness: Jessica Salazar is a 58 y.o. female ***     Activities of Daily Living:  Patient reports morning stiffness for *** {minute/hour:19697}.   Patient {ACTIONS;DENIES/REPORTS:21021675::Denies} nocturnal pain.  Difficulty dressing/grooming: {ACTIONS;DENIES/REPORTS:21021675::Denies} Difficulty climbing stairs: {ACTIONS;DENIES/REPORTS:21021675::Denies} Difficulty getting out of chair: {ACTIONS;DENIES/REPORTS:21021675::Denies} Difficulty using hands for taps, buttons, cutlery, and/or writing: {ACTIONS;DENIES/REPORTS:21021675::Denies}  No Rheumatology ROS completed.   PMFS History:  Patient Active Problem List   Diagnosis Date Noted   Dysesthesia 06/04/2016   Urinary frequency 06/04/2016   Insomnia 06/04/2016   Greater trochanteric bursitis of right hip 06/10/2015   Gait abnormality 12/30/2014   Multiple sclerosis 09/25/2010    Past Medical History:  Diagnosis Date   Arthritis    Constipation    due to MS   Heart murmur    MVP   History of recurrent UTIs    Hypotension    Mitral valve prolapse    Multiple sclerosis    Neuromuscular disorder (HCC)    has /NMS stimulator    Vision abnormalities     Family History  Problem Relation Age of Onset   Heart attack Father    Down syndrome Sister    Healthy Son    Colon polyps Neg Hx    Colon cancer Neg Hx    Esophageal cancer Neg Hx    Rectal cancer Neg Hx    Stomach cancer Neg Hx    Past Surgical History:  Procedure Laterality Date   CARPAL TUNNEL RELEASE Bilateral 2024   Dr. Alyse,   COLONOSCOPY     last > 10 yrs ago in [place]  - done for constipation   MENISCUS REPAIR Right    right  knee replacement Right 2016   right TOtal knee relacement    UPPER GASTROINTESTINAL ENDOSCOPY     Social History   Tobacco Use   Smoking status: Former    Passive exposure: Never   Smokeless tobacco: Never  Vaping Use   Vaping status: Never Used  Substance Use Topics   Alcohol use: Yes    Comment: Rare   Drug use: No   Social History   Social History Narrative   Pt is single with boyfriends she has 1 child right handed   Lives in 1 story home   Drinks coffee daily, no tea rarely drinks soda     Immunization History  Administered Date(s) Administered   Fluzone Influenza virus vaccine,trivalent (IIV3), split virus 10/22/2014   Influenza Inj Mdck Quad Pf 10/20/2018   Influenza Inj Mdck Quad With Preservative 10/11/2021   Influenza-Unspecified 10/22/2014   PFIZER(Purple Top)SARS-COV-2 Vaccination 03/24/2019, 04/14/2019, 09/02/2019   Tdap 09/08/2015     Objective: Vital Signs: There were no vitals taken for this visit.   Physical Exam   Musculoskeletal Exam: ***  CDAI Exam: CDAI Score: -- Patient Global: --; Provider Global: -- Swollen: --; Tender: -- Joint Exam 12/02/2023   No joint exam has been documented for this visit   There is currently no information documented on the homunculus. Go to the Rheumatology activity and complete the  homunculus joint exam.  Investigation: No additional findings.  Imaging: No results found.  Recent Labs: Lab Results  Component Value Date   WBC 3.3 (L) 09/16/2023   HGB 12.0 09/16/2023   PLT 238.0 09/16/2023   NA 143 09/16/2023   K 4.0 09/16/2023   CL 104 09/16/2023   CO2 30 09/16/2023   GLUCOSE 68 (L) 09/16/2023   BUN 18 09/16/2023   CREATININE 0.42 09/16/2023   BILITOT 0.6 09/16/2023   ALKPHOS 67 09/16/2023   AST 15 09/16/2023   ALT 8 09/16/2023   PROT 6.2 09/16/2023   ALBUMIN 4.0 09/16/2023   CALCIUM 8.7 09/16/2023   QFTBGOLD Negative 06/04/2016    Speciality Comments: No specialty comments  available.  Procedures:  No procedures performed Allergies: Patient has no known allergies.   Assessment / Plan:     Visit Diagnoses: Primary osteoarthritis of both hands  History of bilateral carpal tunnel release  Greater trochanteric bursitis of right hip  S/P total knee arthroplasty, right  Chronic right SI joint pain  Arthropathy of lumbar facet joint  Multiple sclerosis  Dysesthesia  Gait abnormality  Other insomnia  Orders: No orders of the defined types were placed in this encounter.  No orders of the defined types were placed in this encounter.   Face-to-face time spent with patient was *** minutes. Greater than 50% of time was spent in counseling and coordination of care.  Follow-Up Instructions: No follow-ups on file.   Waddell CHRISTELLA Craze, PA-C  Note - This record has been created using Dragon software.  Chart creation errors have been sought, but may not always  have been located. Such creation errors do not reflect on  the standard of medical care.

## 2023-12-02 ENCOUNTER — Ambulatory Visit: Admitting: Physician Assistant

## 2023-12-02 DIAGNOSIS — Z9889 Other specified postprocedural states: Secondary | ICD-10-CM

## 2023-12-02 DIAGNOSIS — M19042 Primary osteoarthritis, left hand: Secondary | ICD-10-CM

## 2023-12-02 DIAGNOSIS — M47816 Spondylosis without myelopathy or radiculopathy, lumbar region: Secondary | ICD-10-CM

## 2023-12-02 DIAGNOSIS — G4709 Other insomnia: Secondary | ICD-10-CM

## 2023-12-02 DIAGNOSIS — Z96651 Presence of right artificial knee joint: Secondary | ICD-10-CM

## 2023-12-02 DIAGNOSIS — G8929 Other chronic pain: Secondary | ICD-10-CM

## 2023-12-02 DIAGNOSIS — M7061 Trochanteric bursitis, right hip: Secondary | ICD-10-CM

## 2023-12-02 DIAGNOSIS — G35D Multiple sclerosis, unspecified: Secondary | ICD-10-CM

## 2023-12-02 DIAGNOSIS — R208 Other disturbances of skin sensation: Secondary | ICD-10-CM

## 2023-12-02 DIAGNOSIS — R269 Unspecified abnormalities of gait and mobility: Secondary | ICD-10-CM

## 2023-12-06 ENCOUNTER — Encounter: Payer: Self-pay | Admitting: Gastroenterology

## 2024-01-12 NOTE — Progress Notes (Unsigned)
 "  Office Visit Note  Patient: Jessica Salazar             Date of Birth: 08/24/1965           MRN: [id number]             PCP: Jason Leita Repine, FNP (Inactive) Referring: Jason Leita Repine, FNP Visit Date: 01/26/2024 Occupation: Data Unavailable  Subjective:  No chief complaint on file.   History of Present Illness: Jessica Salazar is a 59 y.o. female ***     Activities of Daily Living:  Patient reports morning stiffness for *** {minute/hour:19697}.   Patient {ACTIONS;DENIES/REPORTS:21021675::Denies} nocturnal pain.  Difficulty dressing/grooming: {ACTIONS;DENIES/REPORTS:21021675::Denies} Difficulty climbing stairs: {ACTIONS;DENIES/REPORTS:21021675::Denies} Difficulty getting out of chair: {ACTIONS;DENIES/REPORTS:21021675::Denies} Difficulty using hands for taps, buttons, cutlery, and/or writing: {ACTIONS;DENIES/REPORTS:21021675::Denies}  No Rheumatology ROS completed.   PMFS History:  Patient Active Problem List   Diagnosis Date Noted   Dysesthesia 06/04/2016   Urinary frequency 06/04/2016   Insomnia 06/04/2016   Greater trochanteric bursitis of right hip 06/10/2015   Gait abnormality 12/30/2014   Multiple sclerosis 09/25/2010    Past Medical History:  Diagnosis Date   Arthritis    Constipation    due to MS   Heart murmur    MVP   History of recurrent UTIs    Hypotension    Mitral valve prolapse    Multiple sclerosis    Neuromuscular disorder (HCC)    has /NMS stimulator    Vision abnormalities     Family History  Problem Relation Age of Onset   Heart attack Father    Down syndrome Sister    Healthy Son    Colon polyps Neg Hx    Colon cancer Neg Hx    Esophageal cancer Neg Hx    Rectal cancer Neg Hx    Stomach cancer Neg Hx    Past Surgical History:  Procedure Laterality Date   CARPAL TUNNEL RELEASE Bilateral 2024   Dr. Alyse,   COLONOSCOPY     last > 10 yrs ago in [place]  - done for constipation   MENISCUS REPAIR Right     right knee replacement Right 2016   right TOtal knee relacement    UPPER GASTROINTESTINAL ENDOSCOPY     Social History[1] Social History   Social History Narrative   Pt is single with boyfriends she has 1 child right handed   Lives in 1 story home   Drinks coffee daily, no tea rarely drinks soda     Immunization History  Administered Date(s) Administered   Fluzone Influenza virus vaccine,trivalent (IIV3), split virus 10/22/2014   Influenza Inj Mdck Quad Pf 10/20/2018   Influenza Inj Mdck Quad With Preservative 10/11/2021   Influenza-Unspecified 10/22/2014   PFIZER(Purple Top)SARS-COV-2 Vaccination 03/24/2019, 04/14/2019, 09/02/2019   Tdap 09/08/2015     Objective: Vital Signs: There were no vitals taken for this visit.   Physical Exam   Musculoskeletal Exam: ***  CDAI Exam: CDAI Score: -- Patient Global: --; Provider Global: -- Swollen: --; Tender: -- Joint Exam 01/26/2024   No joint exam has been documented for this visit   There is currently no information documented on the homunculus. Go to the Rheumatology activity and complete the homunculus joint exam.  Investigation: No additional findings.  Imaging: No results found.  Recent Labs: Lab Results  Component Value Date   WBC 3.3 (L) 09/16/2023   HGB 12.0 09/16/2023   PLT 238.0 09/16/2023   NA 143 09/16/2023   K 4.0 09/16/2023  CL 104 09/16/2023   CO2 30 09/16/2023   GLUCOSE 68 (L) 09/16/2023   BUN 18 09/16/2023   CREATININE 0.42 09/16/2023   BILITOT 0.6 09/16/2023   ALKPHOS 67 09/16/2023   AST 15 09/16/2023   ALT 8 09/16/2023   PROT 6.2 09/16/2023   ALBUMIN 4.0 09/16/2023   CALCIUM 8.7 09/16/2023   QFTBGOLD Negative 06/04/2016    Speciality Comments: No specialty comments available.  Procedures:  No procedures performed Allergies: Patient has no known allergies.   Assessment / Plan:     Visit Diagnoses: Primary osteoarthritis of both hands  History of bilateral carpal tunnel  release  Greater trochanteric bursitis of right hip  S/P total knee arthroplasty, right  Chronic right SI joint pain  Arthropathy of lumbar facet joint  Multiple sclerosis  Dysesthesia  Gait abnormality  Other insomnia  Orders: No orders of the defined types were placed in this encounter.  No orders of the defined types were placed in this encounter.   Face-to-face time spent with patient was *** minutes. Greater than 50% of time was spent in counseling and coordination of care.  Follow-Up Instructions: No follow-ups on file.   Waddell CHRISTELLA Craze, PA-C  Note - This record has been created using Dragon software.  Chart creation errors have been sought, but may not always  have been located. Such creation errors do not reflect on  the standard of medical care.     [1]  Social History Tobacco Use   Smoking status: Former    Passive exposure: Never   Smokeless tobacco: Never  Vaping Use   Vaping status: Never Used  Substance Use Topics   Alcohol use: Yes    Comment: Rare   Drug use: No   "

## 2024-01-17 ENCOUNTER — Encounter: Payer: Self-pay | Admitting: *Deleted

## 2024-01-17 ENCOUNTER — Other Ambulatory Visit: Payer: Self-pay | Admitting: Neurology

## 2024-01-17 DIAGNOSIS — G35D Multiple sclerosis, unspecified: Secondary | ICD-10-CM

## 2024-01-19 ENCOUNTER — Ambulatory Visit: Admitting: Family Medicine

## 2024-01-19 ENCOUNTER — Encounter: Payer: Self-pay | Admitting: Family Medicine

## 2024-01-19 VITALS — BP 108/74 | HR 57 | Temp 97.7°F | Ht 64.0 in | Wt 99.0 lb

## 2024-01-19 DIAGNOSIS — Z7689 Persons encountering health services in other specified circumstances: Secondary | ICD-10-CM

## 2024-01-19 DIAGNOSIS — Z87440 Personal history of urinary (tract) infections: Secondary | ICD-10-CM

## 2024-01-19 DIAGNOSIS — R269 Unspecified abnormalities of gait and mobility: Secondary | ICD-10-CM

## 2024-01-19 DIAGNOSIS — G35D Multiple sclerosis, unspecified: Secondary | ICD-10-CM

## 2024-01-19 MED ORDER — NITROFURANTOIN MONOHYD MACRO 100 MG PO CAPS: 100.0000 mg | ORAL_CAPSULE | Freq: Two times a day (BID) | ORAL | 1 refills | Status: AC

## 2024-01-19 NOTE — Progress Notes (Signed)
 "  New Patient Office Visit  Subjective    Patient ID: Jessica Salazar, female    DOB: Apr 09, 1965  Age: 59 y.o. MRN: [id number]  CC:  Chief Complaint  Patient presents with   Transitions Of Care    Discussed the use of AI scribe software for clinical note transcription with the patient, who gave verbal consent to proceed.  History of Present Illness Jessica Salazar is a 59 year old female with multiple sclerosis who presents to establish care. She is accompanied by her husband, [name].  Multiple sclerosis symptoms and management - Diagnosed with multiple sclerosis, currently treated with infusions every 6 months; next infusion due next week. - Neurology follow-up typically yearly; current neurologist leaving, will establish with new provider next year. - Recent improvement in WBC count from 3.3 (September) to 5.7. - Worsening right-sided MS symptoms with loss of hand function. - Gabapentin previously prescribed for MS-related pain and foot drop, discontinued due to poor relief and side effects. - Lidocaine  patches used for pain management.  Urinary tract infections and bladder symptoms - Recurrent urinary tract infections; last severe episode occurred before Christmas, required urgent care. - Nitrofurantoin  ineffective for most recent UTI; switched to ciprofloxacin . - A few nitrofurantoin  pills remain. - Prone to dehydration due to low water intake; attempting to improve hydration. - Oxybutynin taken for bladder symptoms; dose recently reduced from twice daily to once daily  - No current urology or urogynecology follow-up. - Last pelvic exam and Pap smear approximately 1.5 years ago.  Chronic pain - Chronic back and hip pain previously evaluated by rheumatology without significant findings. - Pain attributed to MS and foot drop. - Gabapentin discontinued due to poor relief and side effects.  Cutaneous lesions - Two small skin dots on mid back noticed approximately 2.5  weeks ago after falling against a closet door. - Lesions are nonpainful and nonpruritic. - No other associated symptoms.  Immunization and infectious disease history - History of shingles; hesitant to receive shingles vaccine. - Received influenza vaccine this year.  Cardiac findings - Resting pulse usually in the 50s.     Outpatient Encounter Medications as of 01/19/2024  Medication Sig   Cholecalciferol (VITAMIN D ) 50 MCG (2000 UT) CAPS 3,000 Units.   dalfampridine 10 MG TB12 Take 10 mg by mouth 2 (two) times daily.   lidocaine  (LIDODERM ) 5 % Place 1 patch onto the skin daily. Remove & Discard patch within 12 hours or as directed by MD   Ocrelizumab  (OCREVUS  IV) Inject into the vein. Infusion 2 times a year, last IV: 01/18/2023   nitrofurantoin , macrocrystal-monohydrate, (MACROBID ) 100 MG capsule Take 1 capsule (100 mg total) by mouth 2 (two) times daily.   promethazine  (PHENERGAN ) 25 MG tablet Take 1 tablet (25 mg total) by mouth every 6 (six) hours as needed for nausea or vomiting. (Patient not taking: Reported on 01/19/2024)   [DISCONTINUED] nitrofurantoin , macrocrystal-monohydrate, (MACROBID ) 100 MG capsule Take 1 capsule (100 mg total) by mouth 2 (two) times daily. (Patient not taking: Reported on 01/19/2024)   No facility-administered encounter medications on file as of 01/19/2024.    Past Medical History:  Diagnosis Date   Arthritis    Constipation    due to MS   Heart murmur    MVP   History of recurrent UTIs    Hypotension    Mitral valve prolapse    Multiple sclerosis    Neuromuscular disorder (HCC)    has /NMS stimulator    Vision  abnormalities     Past Surgical History:  Procedure Laterality Date   CARPAL TUNNEL RELEASE Bilateral 2024   Dr. Alyse,   COLONOSCOPY     last > 10 yrs ago in [place]  - done for constipation   MENISCUS REPAIR Right    right knee replacement Right 2016   right TOtal knee relacement    UPPER GASTROINTESTINAL ENDOSCOPY      Family  History  Problem Relation Age of Onset   Heart attack Father    Down syndrome Sister    Healthy Son    Colon polyps Neg Hx    Colon cancer Neg Hx    Esophageal cancer Neg Hx    Rectal cancer Neg Hx    Stomach cancer Neg Hx     Social History   Socioeconomic History   Marital status: Single    Spouse name: Not on file   Number of children: 1   Years of education: Not on file   Highest education level: 12th grade  Occupational History   Not on file  Tobacco Use   Smoking status: Former    Passive exposure: Never   Smokeless tobacco: Never  Vaping Use   Vaping status: Never Used  Substance and Sexual Activity   Alcohol use: Yes    Comment: Rare   Drug use: No   Sexual activity: Not on file  Other Topics Concern   Not on file  Social History Narrative   Pt is single with boyfriends she has 1 child right handed   Lives in 1 story home   Drinks coffee daily, no tea rarely drinks soda   Social Drivers of Health   Tobacco Use: Medium Risk (01/19/2024)   Patient History    Smoking Tobacco Use: Former    Smokeless Tobacco Use: Never    Passive Exposure: Never  Physicist, Medical Strain: Low Risk (07/08/2023)   Overall Financial Resource Strain (CARDIA)    Difficulty of Paying Living Expenses: Not very hard  Food Insecurity: Low Risk (12/09/2023)   Received from [facility]   Epic    Within the past 12 months, you worried that your food would run out before you got money to buy more: Never true    Within the past 12 months, the food you bought just didn't last and you didn't have money to get more. : Never true  Transportation Needs: No Transportation Needs (12/09/2023)   Received from [facility]    In the past 12 months, has lack of reliable transportation kept you from medical appointments, meetings, work or from getting things needed for daily living? : No  Physical Activity: Inactive (07/08/2023)   Exercise Vital Sign    Days of Exercise per  Week: 0 days    Minutes of Exercise per Session: Not on file  Stress: Stress Concern Present (07/08/2023)   [employer] of Occupational Health - Occupational Stress Questionnaire    Feeling of Stress: Rather much  Social Connections: Unknown (07/08/2023)   Social Connection and Isolation Panel    Frequency of Communication with Friends and Family: Patient declined    Frequency of Social Gatherings with Friends and Family: Patient declined    Attends Religious Services: Never    Database Administrator or Organizations: No    Attends Banker Meetings: Not on file    Marital Status: Living with partner  Intimate Partner Violence: Not on file  Depression (PHQ2-9): Medium Risk (09/09/2023)  Depression (PHQ2-9)    PHQ-2 Score: 8  Alcohol Screen: Not on file  Housing: Low Risk (12/09/2023)   Received from [facility]   Epic    What is your living situation today?: I have a steady place to live    Think about the place you live. Do you have problems with any of the following? Choose all that apply:: None/None on this list  Utilities: Low Risk (12/09/2023)   Received from [facility]   Utilities    In the past 12 months has the electric, gas, oil, or water company threatened to shut off services in your home? : No  Health Literacy: Not on file    Review of Systems  Constitutional:  Negative for chills and fever.  Respiratory:  Negative for shortness of breath.   Cardiovascular:  Negative for chest pain, palpitations and leg swelling.  Gastrointestinal:  Negative for abdominal pain, constipation, diarrhea, nausea and vomiting.  Genitourinary:  Negative for dysuria, frequency and urgency.  Neurological:  Positive for weakness. Negative for dizziness and headaches.        Objective    BP 108/74 (BP Location: Left Arm, Patient Position: Sitting, Cuff Size: Small)   Pulse (!) 57   Temp 97.7 F (36.5 C) (Oral)   Ht 5' 4 (1.626 m)   Wt 99 lb (44.9 kg)   SpO2  99%   BMI 16.99 kg/m   Physical Exam Constitutional:      General: She is not in acute distress.    Appearance: She is not ill-appearing.  Eyes:     Extraocular Movements: Extraocular movements intact.     Conjunctiva/sclera: Conjunctivae normal.  Cardiovascular:     Rate and Rhythm: Regular rhythm. Bradycardia present.  Pulmonary:     Effort: Pulmonary effort is normal.     Breath sounds: Normal breath sounds.  Musculoskeletal:     Cervical back: Normal range of motion and neck supple.  Skin:    General: Skin is warm and dry.  Neurological:     General: No focal deficit present.     Mental Status: She is alert and oriented to person, place, and time.     Gait: Gait abnormal.  Psychiatric:        Mood and Affect: Mood normal.        Behavior: Behavior normal.        Thought Content: Thought content normal.         Assessment & Plan:   Problem List Items Addressed This Visit     Gait abnormality   Multiple sclerosis   Other Visit Diagnoses       History of recurrent urinary tract infection    -  Primary     Encounter to establish care           Assessment and Plan Assessment & Plan Establishment of care New patient establishing care with a focus on continuity of care and coordination with specialists. - Scheduled follow-up appointment in four months.  Multiple sclerosis Chronic condition with increased right-sided involvement. Currently on infusions twice a year. Considering Scionic nerve stimulator for symptom management. Gabapentin previously prescribed but not effective and discontinued. - Continue infusions as scheduled. - Considering Scionic nerve stimulator for symptom management. - Discontinued gabapentin as it was not effective.  Recurrent urinary tract infection Recurrent UTIs with last severe episode before Christmas requiring urgent care and treatment with ciprofloxacin . Current nitrofurantoin  supply is low. Urine culture results pending.  Dehydration noted as a  contributing factor. - Refilled nitrofurantoin  prescription at [facility]. - Encouraged increased fluid intake to prevent dehydration. - Will consider referral to a urologist for further evaluation.  General health maintenance Routine health maintenance discussed, including flu vaccination and upcoming colonoscopy. Shingles vaccine deferred until after Ocrevus  infusion. - Continue with scheduled colonoscopy on January 23rd. - Deferred shingles vaccine until after infusion.     Return in about 4 months (around 05/18/2024) for chronic health conditions.   Boby Mackintosh, NP-C   "

## 2024-01-19 NOTE — Patient Instructions (Signed)
 Thank you for trusting us  with your healthcare.   I will see you back in 4 months or sooner if needed.

## 2024-01-20 ENCOUNTER — Ambulatory Visit

## 2024-01-20 VITALS — Ht 64.5 in | Wt 99.0 lb

## 2024-01-20 DIAGNOSIS — Z860100 Personal history of colon polyps, unspecified: Secondary | ICD-10-CM

## 2024-01-20 MED ORDER — NA SULFATE-K SULFATE-MG SULF 17.5-3.13-1.6 GM/177ML PO SOLN: 1.0000 | Freq: Once | ORAL | 0 refills | Status: AC

## 2024-01-20 NOTE — Progress Notes (Signed)
 Pre visit completed via phone call; Patient verified name, DOB, and address; No egg or soy allergy known to patient;  No issues known to pt with past sedation with any surgeries or procedures; Patient denies ever being told they had issues or difficulty with intubation;  No FH of Malignant Hyperthermia; Pt is not on diet pills; Pt is not on home 02;  Pt is not on blood thinners;  Pt reports issues with constipation -patient reports she is increasing her oral fluids,  No A fib or A flutter; Have any cardiac testing pending--NO Insurance verified during PV appt--- [employer] Pt can ambulate without assistance;  Pt denies use of chewing tobacco; Discussed diabetic/weight loss medication holds; Discussed NSAID holds; Checked BMI to be less than 50; Pt instructed to use [employer] or GoodRx for a price reduction on prep;  Patient's chart reviewed by Norleen Schillings CNRA prior to previsit and patient appropriate for the [facility];  Pre visit completed and red dot placed by patient's name on their procedure day (on provider's schedule);  Instructions sent to MyChart per patient request;

## 2024-01-26 ENCOUNTER — Encounter: Payer: Self-pay | Admitting: Gastroenterology

## 2024-01-26 ENCOUNTER — Ambulatory Visit: Admitting: Physician Assistant

## 2024-01-26 DIAGNOSIS — M47816 Spondylosis without myelopathy or radiculopathy, lumbar region: Secondary | ICD-10-CM

## 2024-01-26 DIAGNOSIS — G8929 Other chronic pain: Secondary | ICD-10-CM

## 2024-01-26 DIAGNOSIS — Z9889 Other specified postprocedural states: Secondary | ICD-10-CM

## 2024-01-26 DIAGNOSIS — M7061 Trochanteric bursitis, right hip: Secondary | ICD-10-CM

## 2024-01-26 DIAGNOSIS — R269 Unspecified abnormalities of gait and mobility: Secondary | ICD-10-CM

## 2024-01-26 DIAGNOSIS — G35D Multiple sclerosis, unspecified: Secondary | ICD-10-CM

## 2024-01-26 DIAGNOSIS — R208 Other disturbances of skin sensation: Secondary | ICD-10-CM

## 2024-01-26 DIAGNOSIS — Z96651 Presence of right artificial knee joint: Secondary | ICD-10-CM

## 2024-01-26 DIAGNOSIS — M19042 Primary osteoarthritis, left hand: Secondary | ICD-10-CM

## 2024-01-26 DIAGNOSIS — G4709 Other insomnia: Secondary | ICD-10-CM

## 2024-01-27 ENCOUNTER — Telehealth: Payer: Self-pay | Admitting: Gastroenterology

## 2024-01-27 NOTE — Telephone Encounter (Signed)
 PT is calling with [employer] to find out what the procedure code is for a high risk colonoscopy. Please advise

## 2024-02-03 ENCOUNTER — Encounter: Admitting: Gastroenterology

## 2024-02-23 ENCOUNTER — Encounter: Admitting: Family Medicine

## 2024-03-27 ENCOUNTER — Encounter: Admitting: Gastroenterology

## 2024-05-18 ENCOUNTER — Ambulatory Visit: Admitting: Family Medicine
